# Patient Record
Sex: Female | Born: 1935 | Race: White | Hispanic: Yes | Marital: Married | Smoking: Former smoker
Health system: Southern US, Community
[De-identification: ages and names within clinical notes are randomized; demographics above are authoritative.]

## PROBLEM LIST (undated history)

## (undated) ENCOUNTER — Emergency Department (HOSPITAL_COMMUNITY): Admission: EM | Payer: Medicare Other

## (undated) DIAGNOSIS — I6521 Occlusion and stenosis of right carotid artery: Secondary | ICD-10-CM

## (undated) DIAGNOSIS — C9110 Chronic lymphocytic leukemia of B-cell type not having achieved remission: Secondary | ICD-10-CM

## (undated) DIAGNOSIS — I10 Essential (primary) hypertension: Secondary | ICD-10-CM

## (undated) DIAGNOSIS — R0989 Other specified symptoms and signs involving the circulatory and respiratory systems: Secondary | ICD-10-CM

## (undated) DIAGNOSIS — N182 Chronic kidney disease, stage 2 (mild): Secondary | ICD-10-CM

## (undated) DIAGNOSIS — Z86010 Personal history of colonic polyps: Secondary | ICD-10-CM

## (undated) DIAGNOSIS — E785 Hyperlipidemia, unspecified: Secondary | ICD-10-CM

## (undated) HISTORY — DX: Chronic kidney disease, stage 2 (mild): N18.2

## (undated) HISTORY — DX: Other specified symptoms and signs involving the circulatory and respiratory systems: R09.89

## (undated) HISTORY — DX: Personal history of colonic polyps: Z86.010

## (undated) HISTORY — DX: Occlusion and stenosis of right carotid artery: I65.21

## (undated) HISTORY — DX: Hyperlipidemia, unspecified: E78.5

## (undated) HISTORY — DX: Essential (primary) hypertension: I10

## (undated) HISTORY — DX: Chronic lymphocytic leukemia of B-cell type not having achieved remission: C91.10

---

## 1998-05-24 ENCOUNTER — Encounter: Admission: RE | Admit: 1998-05-24 | Discharge: 1998-05-24 | Payer: Self-pay | Admitting: Internal Medicine

## 1998-12-30 ENCOUNTER — Encounter: Admission: RE | Admit: 1998-12-30 | Discharge: 1998-12-30 | Payer: Self-pay | Admitting: Internal Medicine

## 1999-07-09 ENCOUNTER — Encounter: Admission: RE | Admit: 1999-07-09 | Discharge: 1999-07-09 | Payer: Self-pay | Admitting: Internal Medicine

## 1999-09-30 ENCOUNTER — Encounter: Admission: RE | Admit: 1999-09-30 | Discharge: 1999-09-30 | Payer: Self-pay | Admitting: Internal Medicine

## 2000-01-14 ENCOUNTER — Encounter: Payer: Self-pay | Admitting: Internal Medicine

## 2000-01-14 ENCOUNTER — Encounter: Admission: RE | Admit: 2000-01-14 | Discharge: 2000-01-14 | Payer: Self-pay | Admitting: Internal Medicine

## 2000-01-28 ENCOUNTER — Encounter: Admission: RE | Admit: 2000-01-28 | Discharge: 2000-01-28 | Payer: Self-pay | Admitting: Internal Medicine

## 2000-07-29 ENCOUNTER — Encounter: Payer: Self-pay | Admitting: General Surgery

## 2000-07-29 ENCOUNTER — Encounter: Admission: RE | Admit: 2000-07-29 | Discharge: 2000-07-29 | Payer: Self-pay | Admitting: General Surgery

## 2000-08-31 ENCOUNTER — Encounter: Admission: RE | Admit: 2000-08-31 | Discharge: 2000-08-31 | Payer: Self-pay | Admitting: Internal Medicine

## 2000-09-08 ENCOUNTER — Encounter: Admission: RE | Admit: 2000-09-08 | Discharge: 2000-09-08 | Payer: Self-pay | Admitting: Hematology and Oncology

## 2000-09-23 ENCOUNTER — Encounter: Admission: RE | Admit: 2000-09-23 | Discharge: 2000-09-23 | Payer: Self-pay | Admitting: Internal Medicine

## 2000-09-23 ENCOUNTER — Ambulatory Visit (HOSPITAL_COMMUNITY): Admission: RE | Admit: 2000-09-23 | Discharge: 2000-09-23 | Payer: Self-pay | Admitting: Hematology and Oncology

## 2000-09-29 ENCOUNTER — Encounter: Admission: RE | Admit: 2000-09-29 | Discharge: 2000-09-29 | Payer: Self-pay | Admitting: Internal Medicine

## 2000-11-23 DIAGNOSIS — Z860100 Personal history of colon polyps, unspecified: Secondary | ICD-10-CM

## 2000-11-23 HISTORY — DX: Personal history of colonic polyps: Z86.010

## 2000-11-23 HISTORY — DX: Personal history of colon polyps, unspecified: Z86.0100

## 2001-02-10 ENCOUNTER — Encounter: Payer: Self-pay | Admitting: Internal Medicine

## 2001-02-10 ENCOUNTER — Encounter: Admission: RE | Admit: 2001-02-10 | Discharge: 2001-02-10 | Payer: Self-pay | Admitting: Internal Medicine

## 2001-04-01 ENCOUNTER — Ambulatory Visit (HOSPITAL_COMMUNITY): Admission: RE | Admit: 2001-04-01 | Discharge: 2001-04-01 | Payer: Self-pay | Admitting: Gastroenterology

## 2001-04-01 ENCOUNTER — Encounter (INDEPENDENT_AMBULATORY_CARE_PROVIDER_SITE_OTHER): Payer: Self-pay | Admitting: *Deleted

## 2001-08-23 ENCOUNTER — Encounter: Admission: RE | Admit: 2001-08-23 | Discharge: 2001-08-23 | Payer: Self-pay | Admitting: Internal Medicine

## 2002-02-13 ENCOUNTER — Encounter: Admission: RE | Admit: 2002-02-13 | Discharge: 2002-02-13 | Payer: Self-pay | Admitting: Internal Medicine

## 2002-02-13 ENCOUNTER — Encounter: Payer: Self-pay | Admitting: Internal Medicine

## 2002-04-26 ENCOUNTER — Encounter: Admission: RE | Admit: 2002-04-26 | Discharge: 2002-04-26 | Payer: Self-pay | Admitting: Internal Medicine

## 2002-10-03 ENCOUNTER — Encounter: Admission: RE | Admit: 2002-10-03 | Discharge: 2002-10-03 | Payer: Self-pay | Admitting: Internal Medicine

## 2002-11-01 ENCOUNTER — Encounter: Admission: RE | Admit: 2002-11-01 | Discharge: 2002-11-01 | Payer: Self-pay | Admitting: Internal Medicine

## 2002-12-20 ENCOUNTER — Encounter: Admission: RE | Admit: 2002-12-20 | Discharge: 2002-12-20 | Payer: Self-pay | Admitting: Internal Medicine

## 2003-02-15 ENCOUNTER — Encounter: Payer: Self-pay | Admitting: Internal Medicine

## 2003-02-15 ENCOUNTER — Encounter: Admission: RE | Admit: 2003-02-15 | Discharge: 2003-02-15 | Payer: Self-pay | Admitting: Internal Medicine

## 2003-04-18 ENCOUNTER — Encounter: Admission: RE | Admit: 2003-04-18 | Discharge: 2003-04-18 | Payer: Self-pay | Admitting: Internal Medicine

## 2003-07-10 ENCOUNTER — Encounter: Admission: RE | Admit: 2003-07-10 | Discharge: 2003-07-10 | Payer: Self-pay | Admitting: Internal Medicine

## 2003-11-12 ENCOUNTER — Encounter: Admission: RE | Admit: 2003-11-12 | Discharge: 2003-11-12 | Payer: Self-pay | Admitting: Internal Medicine

## 2004-01-22 ENCOUNTER — Encounter: Admission: RE | Admit: 2004-01-22 | Discharge: 2004-01-22 | Payer: Self-pay | Admitting: Internal Medicine

## 2004-03-03 ENCOUNTER — Encounter: Admission: RE | Admit: 2004-03-03 | Discharge: 2004-03-03 | Payer: Self-pay | Admitting: Internal Medicine

## 2004-09-22 ENCOUNTER — Ambulatory Visit (HOSPITAL_COMMUNITY): Admission: RE | Admit: 2004-09-22 | Discharge: 2004-09-22 | Payer: Self-pay | Admitting: Gastroenterology

## 2004-10-06 ENCOUNTER — Ambulatory Visit: Payer: Self-pay | Admitting: Internal Medicine

## 2004-10-21 ENCOUNTER — Ambulatory Visit: Payer: Self-pay | Admitting: Oncology

## 2005-01-27 ENCOUNTER — Ambulatory Visit: Payer: Self-pay | Admitting: Oncology

## 2005-03-02 ENCOUNTER — Ambulatory Visit: Payer: Self-pay | Admitting: Internal Medicine

## 2005-04-06 ENCOUNTER — Encounter: Admission: RE | Admit: 2005-04-06 | Discharge: 2005-04-06 | Payer: Self-pay | Admitting: Internal Medicine

## 2005-07-06 ENCOUNTER — Ambulatory Visit: Payer: Self-pay | Admitting: Internal Medicine

## 2005-07-23 ENCOUNTER — Ambulatory Visit: Payer: Self-pay | Admitting: Internal Medicine

## 2005-07-24 ENCOUNTER — Ambulatory Visit: Payer: Self-pay | Admitting: Oncology

## 2005-08-23 DIAGNOSIS — I6521 Occlusion and stenosis of right carotid artery: Secondary | ICD-10-CM

## 2005-08-23 HISTORY — DX: Occlusion and stenosis of right carotid artery: I65.21

## 2005-09-03 ENCOUNTER — Emergency Department (HOSPITAL_COMMUNITY): Admission: EM | Admit: 2005-09-03 | Discharge: 2005-09-03 | Payer: Self-pay | Admitting: Emergency Medicine

## 2005-09-16 ENCOUNTER — Encounter: Payer: Self-pay | Admitting: Cardiology

## 2005-09-16 ENCOUNTER — Ambulatory Visit (HOSPITAL_COMMUNITY): Admission: RE | Admit: 2005-09-16 | Discharge: 2005-09-16 | Payer: Self-pay | Admitting: Internal Medicine

## 2005-09-16 ENCOUNTER — Ambulatory Visit: Payer: Self-pay | Admitting: Cardiology

## 2005-09-21 ENCOUNTER — Ambulatory Visit: Payer: Self-pay | Admitting: Internal Medicine

## 2005-09-23 ENCOUNTER — Ambulatory Visit (HOSPITAL_COMMUNITY): Admission: RE | Admit: 2005-09-23 | Discharge: 2005-09-23 | Payer: Self-pay | Admitting: Internal Medicine

## 2005-10-28 ENCOUNTER — Ambulatory Visit: Payer: Self-pay | Admitting: Internal Medicine

## 2005-11-03 ENCOUNTER — Ambulatory Visit: Payer: Self-pay | Admitting: Oncology

## 2006-02-01 ENCOUNTER — Ambulatory Visit: Payer: Self-pay | Admitting: Oncology

## 2006-03-09 LAB — CBC WITH DIFFERENTIAL/PLATELET
Basophils Absolute: 0.5 10*3/uL — ABNORMAL HIGH (ref 0.0–0.1)
Eosinophils Absolute: 0.8 10*3/uL — ABNORMAL HIGH (ref 0.0–0.5)
HCT: 33.4 % — ABNORMAL LOW (ref 34.8–46.6)
HGB: 10.8 g/dL — ABNORMAL LOW (ref 11.6–15.9)
LYMPH%: 71.8 % — ABNORMAL HIGH (ref 14.0–48.0)
MCV: 96.7 fL (ref 81.0–101.0)
MONO#: 16.9 10*3/uL — ABNORMAL HIGH (ref 0.1–0.9)
NEUT#: 12.5 10*3/uL — ABNORMAL HIGH (ref 1.5–6.5)
NEUT%: 11.5 % — ABNORMAL LOW (ref 39.6–76.8)
Platelets: 365 10*3/uL (ref 145–400)
RBC: 3.45 10*6/uL — ABNORMAL LOW (ref 3.70–5.32)
WBC: 108.5 10*3/uL (ref 3.9–10.0)

## 2006-03-09 LAB — TECHNOLOGIST REVIEW

## 2006-03-16 LAB — CBC WITH DIFFERENTIAL/PLATELET
Basophils Absolute: 0.1 10*3/uL (ref 0.0–0.1)
EOS%: 2.4 % (ref 0.0–7.0)
HCT: 30.9 % — ABNORMAL LOW (ref 34.8–46.6)
HGB: 10.7 g/dL — ABNORMAL LOW (ref 11.6–15.9)
LYMPH%: 26.7 % (ref 14.0–48.0)
MCH: 31.9 pg (ref 26.0–34.0)
NEUT%: 56.7 % (ref 39.6–76.8)
Platelets: 307 10*3/uL (ref 145–400)
lymph#: 2.1 10*3/uL (ref 0.9–3.3)

## 2006-03-21 ENCOUNTER — Ambulatory Visit: Payer: Self-pay | Admitting: Oncology

## 2006-03-23 LAB — CBC WITH DIFFERENTIAL/PLATELET
Basophils Absolute: 0.1 10*3/uL (ref 0.0–0.1)
Eosinophils Absolute: 0.2 10*3/uL (ref 0.0–0.5)
HGB: 11 g/dL — ABNORMAL LOW (ref 11.6–15.9)
LYMPH%: 34.8 % (ref 14.0–48.0)
MCV: 92 fL (ref 81.0–101.0)
MONO#: 1.2 10*3/uL — ABNORMAL HIGH (ref 0.1–0.9)
MONO%: 13 % (ref 0.0–13.0)
NEUT#: 4.5 10*3/uL (ref 1.5–6.5)
Platelets: 348 10*3/uL (ref 145–400)
WBC: 9.1 10*3/uL (ref 3.9–10.0)

## 2006-03-29 ENCOUNTER — Ambulatory Visit: Payer: Self-pay | Admitting: Internal Medicine

## 2006-03-30 LAB — CBC WITH DIFFERENTIAL/PLATELET
Eosinophils Absolute: 0.2 10*3/uL (ref 0.0–0.5)
HCT: 31 % — ABNORMAL LOW (ref 34.8–46.6)
LYMPH%: 37 % (ref 14.0–48.0)
MCHC: 34.2 g/dL (ref 32.0–36.0)
MCV: 92.4 fL (ref 81.0–101.0)
MONO#: 1 10*3/uL — ABNORMAL HIGH (ref 0.1–0.9)
MONO%: 12.8 % (ref 0.0–13.0)
NEUT#: 3.6 10*3/uL (ref 1.5–6.5)
NEUT%: 46.6 % (ref 39.6–76.8)
Platelets: 333 10*3/uL (ref 145–400)
RBC: 3.35 10*6/uL — ABNORMAL LOW (ref 3.70–5.32)
WBC: 7.6 10*3/uL (ref 3.9–10.0)

## 2006-04-06 LAB — CBC WITH DIFFERENTIAL/PLATELET
BASO%: 1.2 % (ref 0.0–2.0)
EOS%: 5.6 % (ref 0.0–7.0)
HCT: 32.1 % — ABNORMAL LOW (ref 34.8–46.6)
LYMPH%: 20.9 % (ref 14.0–48.0)
MCH: 31.7 pg (ref 26.0–34.0)
MCHC: 34.1 g/dL (ref 32.0–36.0)
MONO%: 12.8 % (ref 0.0–13.0)
NEUT%: 59.5 % (ref 39.6–76.8)
Platelets: 305 10*3/uL (ref 145–400)
RBC: 3.45 10*6/uL — ABNORMAL LOW (ref 3.70–5.32)
WBC: 8 10*3/uL (ref 3.9–10.0)

## 2006-04-13 LAB — CBC WITH DIFFERENTIAL/PLATELET
Basophils Absolute: 0.1 10*3/uL (ref 0.0–0.1)
Eosinophils Absolute: 0.5 10*3/uL (ref 0.0–0.5)
HGB: 11 g/dL — ABNORMAL LOW (ref 11.6–15.9)
MONO#: 1.1 10*3/uL — ABNORMAL HIGH (ref 0.1–0.9)
NEUT#: 4.6 10*3/uL (ref 1.5–6.5)
RBC: 3.51 10*6/uL — ABNORMAL LOW (ref 3.70–5.32)
RDW: 12.2 % (ref 11.3–14.5)
WBC: 8 10*3/uL (ref 3.9–10.0)
lymph#: 1.7 10*3/uL (ref 0.9–3.3)

## 2006-04-15 ENCOUNTER — Encounter: Admission: RE | Admit: 2006-04-15 | Discharge: 2006-04-15 | Payer: Self-pay | Admitting: Internal Medicine

## 2006-04-23 LAB — CBC WITH DIFFERENTIAL/PLATELET
Basophils Absolute: 0 10*3/uL (ref 0.0–0.1)
EOS%: 6.3 % (ref 0.0–7.0)
HCT: 32.4 % — ABNORMAL LOW (ref 34.8–46.6)
HGB: 10.9 g/dL — ABNORMAL LOW (ref 11.6–15.9)
MCH: 31.2 pg (ref 26.0–34.0)
MCV: 92.8 fL (ref 81.0–101.0)
MONO%: 14.2 % — ABNORMAL HIGH (ref 0.0–13.0)
NEUT%: 52.5 % (ref 39.6–76.8)

## 2006-04-27 LAB — IMMUNOFIXATION ELECTROPHORESIS
IgM, Serum: 42 mg/dL — ABNORMAL LOW (ref 60–263)
Total Protein, Serum Electrophoresis: 6.5 g/dL (ref 6.0–8.3)

## 2006-04-27 LAB — COMPREHENSIVE METABOLIC PANEL
ALT: 14 U/L (ref 0–40)
AST: 17 U/L (ref 0–37)
Albumin: 4.3 g/dL (ref 3.5–5.2)
CO2: 25 mEq/L (ref 19–32)
Calcium: 9.9 mg/dL (ref 8.4–10.5)
Chloride: 106 mEq/L (ref 96–112)
Creatinine, Ser: 1.3 mg/dL — ABNORMAL HIGH (ref 0.40–1.20)
Potassium: 4.2 mEq/L (ref 3.5–5.3)

## 2006-04-30 ENCOUNTER — Encounter: Admission: RE | Admit: 2006-04-30 | Discharge: 2006-04-30 | Payer: Self-pay | Admitting: Internal Medicine

## 2006-05-18 ENCOUNTER — Ambulatory Visit: Payer: Self-pay | Admitting: Oncology

## 2006-05-21 LAB — MORPHOLOGY: PLT EST: ADEQUATE

## 2006-05-21 LAB — CBC WITH DIFFERENTIAL/PLATELET
BASO%: 0.1 % (ref 0.0–2.0)
Basophils Absolute: 0 10*3/uL (ref 0.0–0.1)
EOS%: 3 % (ref 0.0–7.0)
Eosinophils Absolute: 0.2 10*3/uL (ref 0.0–0.5)
HCT: 32.5 % — ABNORMAL LOW (ref 34.8–46.6)
HGB: 11.1 g/dL — ABNORMAL LOW (ref 11.6–15.9)
MCV: 91.4 fL (ref 81.0–101.0)
MONO#: 1 10*3/uL — ABNORMAL HIGH (ref 0.1–0.9)
NEUT#: 4.2 10*3/uL (ref 1.5–6.5)
NEUT%: 59.3 % (ref 39.6–76.8)
Platelets: 343 10*3/uL (ref 145–400)
WBC: 7.1 10*3/uL (ref 3.9–10.0)

## 2006-07-11 ENCOUNTER — Ambulatory Visit: Payer: Self-pay | Admitting: Oncology

## 2006-07-20 LAB — CBC WITH DIFFERENTIAL/PLATELET
BASO%: 0.5 % (ref 0.0–2.0)
Eosinophils Absolute: 0.2 10*3/uL (ref 0.0–0.5)
HCT: 33.1 % — ABNORMAL LOW (ref 34.8–46.6)
LYMPH%: 22.7 % (ref 14.0–48.0)
MCHC: 34.7 g/dL (ref 32.0–36.0)
MCV: 91 fL (ref 81.0–101.0)
MONO#: 0.9 10*3/uL (ref 0.1–0.9)
MONO%: 12.1 % (ref 0.0–13.0)
NEUT%: 62.5 % (ref 39.6–76.8)
Platelets: 357 10*3/uL (ref 145–400)
WBC: 7.4 10*3/uL (ref 3.9–10.0)

## 2006-07-21 ENCOUNTER — Ambulatory Visit: Payer: Self-pay | Admitting: Internal Medicine

## 2006-09-23 ENCOUNTER — Ambulatory Visit: Payer: Self-pay | Admitting: Oncology

## 2006-09-27 LAB — CBC WITH DIFFERENTIAL/PLATELET
BASO%: 0.4 % (ref 0.0–2.0)
Basophils Absolute: 0 10*3/uL (ref 0.0–0.1)
EOS%: 3.1 % (ref 0.0–7.0)
HGB: 11.6 g/dL (ref 11.6–15.9)
MCH: 32 pg (ref 26.0–34.0)
RBC: 3.61 10*6/uL — ABNORMAL LOW (ref 3.70–5.32)
RDW: 12.9 % (ref 11.3–14.5)
lymph#: 2.2 10*3/uL (ref 0.9–3.3)

## 2006-09-28 DIAGNOSIS — E1122 Type 2 diabetes mellitus with diabetic chronic kidney disease: Secondary | ICD-10-CM

## 2006-09-28 DIAGNOSIS — I6521 Occlusion and stenosis of right carotid artery: Secondary | ICD-10-CM

## 2006-09-28 DIAGNOSIS — I10 Essential (primary) hypertension: Secondary | ICD-10-CM | POA: Insufficient documentation

## 2006-09-28 DIAGNOSIS — C9110 Chronic lymphocytic leukemia of B-cell type not having achieved remission: Secondary | ICD-10-CM

## 2006-09-28 DIAGNOSIS — N183 Chronic kidney disease, stage 3 (moderate): Secondary | ICD-10-CM

## 2006-09-28 DIAGNOSIS — Z860100 Personal history of colon polyps, unspecified: Secondary | ICD-10-CM | POA: Insufficient documentation

## 2006-09-28 DIAGNOSIS — E785 Hyperlipidemia, unspecified: Secondary | ICD-10-CM | POA: Insufficient documentation

## 2006-09-28 LAB — COMPREHENSIVE METABOLIC PANEL
ALT: 13 U/L (ref 0–35)
AST: 14 U/L (ref 0–37)
Albumin: 4.5 g/dL (ref 3.5–5.2)
BUN: 48 mg/dL — ABNORMAL HIGH (ref 6–23)
Calcium: 9.5 mg/dL (ref 8.4–10.5)
Chloride: 109 mEq/L (ref 96–112)
Potassium: 4.3 mEq/L (ref 3.5–5.3)
Sodium: 143 mEq/L (ref 135–145)
Total Protein: 7 g/dL (ref 6.0–8.3)

## 2006-10-05 LAB — CBC WITH DIFFERENTIAL/PLATELET
BASO%: 0.8 % (ref 0.0–2.0)
EOS%: 6.8 % (ref 0.0–7.0)
HGB: 11.5 g/dL — ABNORMAL LOW (ref 11.6–15.9)
MCH: 31.5 pg (ref 26.0–34.0)
MCV: 90 fL (ref 81.0–101.0)
MONO%: 16.8 % — ABNORMAL HIGH (ref 0.0–13.0)
NEUT#: 4.1 10*3/uL (ref 1.5–6.5)
RBC: 3.65 10*6/uL — ABNORMAL LOW (ref 3.70–5.32)
RDW: 10.9 % — ABNORMAL LOW (ref 11.3–14.5)
lymph#: 1.7 10*3/uL (ref 0.9–3.3)

## 2006-10-11 ENCOUNTER — Ambulatory Visit: Payer: Self-pay | Admitting: Internal Medicine

## 2006-10-11 LAB — CONVERTED CEMR LAB
ALT: 11 units/L (ref 0–35)
Alkaline Phosphatase: 70 units/L (ref 39–117)
BUN: 39 mg/dL — ABNORMAL HIGH (ref 6–23)
CO2: 23 meq/L (ref 19–32)
Calcium: 9.2 mg/dL (ref 8.4–10.5)
Chloride: 104 meq/L (ref 96–112)
Potassium: 3.9 meq/L (ref 3.5–5.3)
Sodium: 143 meq/L (ref 135–145)
Total Bilirubin: 0.6 mg/dL (ref 0.3–1.2)
Total Protein: 7 g/dL (ref 6.0–8.3)

## 2006-10-12 LAB — CBC WITH DIFFERENTIAL/PLATELET
Basophils Absolute: 0.1 10*3/uL (ref 0.0–0.1)
Eosinophils Absolute: 1.3 10*3/uL — ABNORMAL HIGH (ref 0.0–0.5)
HGB: 11.1 g/dL — ABNORMAL LOW (ref 11.6–15.9)
MONO#: 1.2 10*3/uL — ABNORMAL HIGH (ref 0.1–0.9)
MONO%: 11 % (ref 0.0–13.0)
NEUT#: 6.3 10*3/uL (ref 1.5–6.5)
RBC: 3.55 10*6/uL — ABNORMAL LOW (ref 3.70–5.32)
RDW: 11 % — ABNORMAL LOW (ref 11.3–14.5)
WBC: 10.9 10*3/uL — ABNORMAL HIGH (ref 3.9–10.0)
lymph#: 1.9 10*3/uL (ref 0.9–3.3)

## 2006-10-19 LAB — CBC WITH DIFFERENTIAL/PLATELET
Basophils Absolute: 0.1 10*3/uL (ref 0.0–0.1)
Eosinophils Absolute: 0.8 10*3/uL — ABNORMAL HIGH (ref 0.0–0.5)
HCT: 31.8 % — ABNORMAL LOW (ref 34.8–46.6)
HGB: 11 g/dL — ABNORMAL LOW (ref 11.6–15.9)
LYMPH%: 15.7 % (ref 14.0–48.0)
MCV: 90.2 fL (ref 81.0–101.0)
MONO#: 1.6 10*3/uL — ABNORMAL HIGH (ref 0.1–0.9)
NEUT#: 7.5 10*3/uL — ABNORMAL HIGH (ref 1.5–6.5)
Platelets: 428 10*3/uL — ABNORMAL HIGH (ref 145–400)
RBC: 3.53 10*6/uL — ABNORMAL LOW (ref 3.70–5.32)
WBC: 11.8 10*3/uL — ABNORMAL HIGH (ref 3.9–10.0)

## 2006-11-10 ENCOUNTER — Ambulatory Visit: Payer: Self-pay | Admitting: Internal Medicine

## 2007-01-13 ENCOUNTER — Ambulatory Visit: Payer: Self-pay | Admitting: Oncology

## 2007-01-18 LAB — CBC WITH DIFFERENTIAL/PLATELET
Basophils Absolute: 0 10*3/uL (ref 0.0–0.1)
EOS%: 1.3 % (ref 0.0–7.0)
HCT: 34.1 % — ABNORMAL LOW (ref 34.8–46.6)
HGB: 11.8 g/dL (ref 11.6–15.9)
MCH: 31.8 pg (ref 26.0–34.0)
MCHC: 34.5 g/dL (ref 32.0–36.0)
MCV: 91.9 fL (ref 81.0–101.0)
MONO%: 10 % (ref 0.0–13.0)
NEUT%: 71.5 % (ref 39.6–76.8)
RDW: 13.5 % (ref 11.3–14.5)

## 2007-01-19 LAB — COMPREHENSIVE METABOLIC PANEL
ALT: 17 U/L (ref 0–35)
AST: 16 U/L (ref 0–37)
Alkaline Phosphatase: 73 U/L (ref 39–117)
BUN: 47 mg/dL — ABNORMAL HIGH (ref 6–23)
Calcium: 10.1 mg/dL (ref 8.4–10.5)
Chloride: 106 mEq/L (ref 96–112)
Creatinine, Ser: 1.18 mg/dL (ref 0.40–1.20)
Total Bilirubin: 0.4 mg/dL (ref 0.3–1.2)

## 2007-01-25 ENCOUNTER — Encounter: Payer: Self-pay | Admitting: Internal Medicine

## 2007-03-08 ENCOUNTER — Ambulatory Visit: Payer: Self-pay | Admitting: Internal Medicine

## 2007-03-08 DIAGNOSIS — I499 Cardiac arrhythmia, unspecified: Secondary | ICD-10-CM | POA: Insufficient documentation

## 2007-04-14 ENCOUNTER — Ambulatory Visit: Payer: Self-pay | Admitting: Oncology

## 2007-04-19 LAB — COMPREHENSIVE METABOLIC PANEL
AST: 15 U/L (ref 0–37)
Albumin: 4.4 g/dL (ref 3.5–5.2)
Alkaline Phosphatase: 66 U/L (ref 39–117)
BUN: 46 mg/dL — ABNORMAL HIGH (ref 6–23)
Creatinine, Ser: 1.18 mg/dL (ref 0.40–1.20)
Glucose, Bld: 113 mg/dL — ABNORMAL HIGH (ref 70–99)
Potassium: 4.2 mEq/L (ref 3.5–5.3)

## 2007-04-19 LAB — CBC WITH DIFFERENTIAL/PLATELET
Basophils Absolute: 0 10*3/uL (ref 0.0–0.1)
EOS%: 2.4 % (ref 0.0–7.0)
Eosinophils Absolute: 0.2 10*3/uL (ref 0.0–0.5)
HCT: 32.3 % — ABNORMAL LOW (ref 34.8–46.6)
HGB: 11.2 g/dL — ABNORMAL LOW (ref 11.6–15.9)
LYMPH%: 23 % (ref 14.0–48.0)
MCH: 32.3 pg (ref 26.0–34.0)
MCV: 93.1 fL (ref 81.0–101.0)
MONO%: 12.3 % (ref 0.0–13.0)
NEUT#: 4.6 10*3/uL (ref 1.5–6.5)
NEUT%: 62 % (ref 39.6–76.8)
Platelets: 336 10*3/uL (ref 145–400)
RDW: 12.8 % (ref 11.3–14.5)

## 2007-04-26 ENCOUNTER — Encounter: Payer: Self-pay | Admitting: Internal Medicine

## 2007-06-15 ENCOUNTER — Encounter: Payer: Self-pay | Admitting: Internal Medicine

## 2007-06-15 ENCOUNTER — Encounter: Admission: RE | Admit: 2007-06-15 | Discharge: 2007-06-15 | Payer: Self-pay | Admitting: Internal Medicine

## 2007-07-17 ENCOUNTER — Ambulatory Visit: Payer: Self-pay | Admitting: Oncology

## 2007-07-19 LAB — CBC WITH DIFFERENTIAL/PLATELET
Basophils Absolute: 0.1 10*3/uL (ref 0.0–0.1)
Eosinophils Absolute: 0.2 10*3/uL (ref 0.0–0.5)
HGB: 11.9 g/dL (ref 11.6–15.9)
MONO#: 0.9 10*3/uL (ref 0.1–0.9)
NEUT#: 7.4 10*3/uL — ABNORMAL HIGH (ref 1.5–6.5)
Platelets: 350 10*3/uL (ref 145–400)
RBC: 3.64 10*6/uL — ABNORMAL LOW (ref 3.70–5.32)
RDW: 12.7 % (ref 11.3–14.5)
WBC: 10.9 10*3/uL — ABNORMAL HIGH (ref 3.9–10.0)

## 2007-07-20 ENCOUNTER — Ambulatory Visit: Payer: Self-pay | Admitting: Internal Medicine

## 2007-07-20 LAB — CONVERTED CEMR LAB
Blood Glucose, Fingerstick: 129
Cholesterol: 125 mg/dL (ref 0–200)
Microalb Creat Ratio: 5.2 mg/g (ref 0.0–30.0)

## 2007-07-21 LAB — COMPREHENSIVE METABOLIC PANEL
Albumin: 4.3 g/dL (ref 3.5–5.2)
BUN: 47 mg/dL — ABNORMAL HIGH (ref 6–23)
CO2: 24 mEq/L (ref 19–32)
Calcium: 9.7 mg/dL (ref 8.4–10.5)
Chloride: 104 mEq/L (ref 96–112)
Glucose, Bld: 181 mg/dL — ABNORMAL HIGH (ref 70–99)
Potassium: 4 mEq/L (ref 3.5–5.3)

## 2007-07-21 LAB — IMMUNOFIXATION ELECTROPHORESIS
IgG (Immunoglobin G), Serum: 854 mg/dL (ref 694–1618)
Total Protein, Serum Electrophoresis: 6.8 g/dL (ref 6.0–8.3)

## 2007-07-26 ENCOUNTER — Encounter: Payer: Self-pay | Admitting: Internal Medicine

## 2007-09-19 ENCOUNTER — Ambulatory Visit: Payer: Self-pay | Admitting: Infectious Diseases

## 2007-12-09 ENCOUNTER — Telehealth: Payer: Self-pay | Admitting: Internal Medicine

## 2007-12-09 ENCOUNTER — Ambulatory Visit: Payer: Self-pay | Admitting: Surgery

## 2007-12-09 ENCOUNTER — Ambulatory Visit (HOSPITAL_COMMUNITY): Admission: RE | Admit: 2007-12-09 | Discharge: 2007-12-09 | Payer: Self-pay | Admitting: Internal Medicine

## 2007-12-09 ENCOUNTER — Encounter: Payer: Self-pay | Admitting: Internal Medicine

## 2007-12-09 ENCOUNTER — Ambulatory Visit: Payer: Self-pay | Admitting: Internal Medicine

## 2007-12-14 ENCOUNTER — Encounter: Payer: Self-pay | Admitting: *Deleted

## 2007-12-14 ENCOUNTER — Encounter: Payer: Self-pay | Admitting: Internal Medicine

## 2007-12-14 ENCOUNTER — Ambulatory Visit: Payer: Self-pay | Admitting: Internal Medicine

## 2007-12-14 LAB — CONVERTED CEMR LAB
CO2: 23 meq/L (ref 19–32)
Calcium: 10.4 mg/dL (ref 8.4–10.5)
Chloride: 104 meq/L (ref 96–112)
Potassium: 4.2 meq/L (ref 3.5–5.3)
Sodium: 142 meq/L (ref 135–145)

## 2007-12-15 ENCOUNTER — Ambulatory Visit (HOSPITAL_COMMUNITY): Admission: RE | Admit: 2007-12-15 | Discharge: 2007-12-15 | Payer: Self-pay | Admitting: Internal Medicine

## 2007-12-16 ENCOUNTER — Encounter: Payer: Self-pay | Admitting: Internal Medicine

## 2008-01-12 ENCOUNTER — Ambulatory Visit: Payer: Self-pay | Admitting: Oncology

## 2008-01-17 LAB — CBC WITH DIFFERENTIAL/PLATELET
BASO%: 0.5 % (ref 0.0–2.0)
EOS%: 1.7 % (ref 0.0–7.0)
HCT: 33.6 % — ABNORMAL LOW (ref 34.8–46.6)
LYMPH%: 35.1 % (ref 14.0–48.0)
MCH: 31.1 pg (ref 26.0–34.0)
MCHC: 33.5 g/dL (ref 32.0–36.0)
MONO%: 9.6 % (ref 0.0–13.0)
NEUT%: 53.1 % (ref 39.6–76.8)
Platelets: 391 10*3/uL (ref 145–400)
RBC: 3.61 10*6/uL — ABNORMAL LOW (ref 3.70–5.32)
WBC: 10.4 10*3/uL — ABNORMAL HIGH (ref 3.9–10.0)

## 2008-01-17 LAB — MORPHOLOGY: RBC Comments: NORMAL

## 2008-01-18 LAB — COMPREHENSIVE METABOLIC PANEL
AST: 18 U/L (ref 0–37)
Albumin: 4.5 g/dL (ref 3.5–5.2)
Alkaline Phosphatase: 65 U/L (ref 39–117)
Glucose, Bld: 146 mg/dL — ABNORMAL HIGH (ref 70–99)
Potassium: 4 mEq/L (ref 3.5–5.3)
Sodium: 141 mEq/L (ref 135–145)
Total Bilirubin: 0.4 mg/dL (ref 0.3–1.2)
Total Protein: 7.1 g/dL (ref 6.0–8.3)

## 2008-01-18 LAB — BETA 2 MICROGLOBULIN, SERUM: Beta-2 Microglobulin: 3.89 mg/L — ABNORMAL HIGH (ref 1.01–1.73)

## 2008-03-21 ENCOUNTER — Ambulatory Visit: Payer: Self-pay | Admitting: Oncology

## 2008-03-26 ENCOUNTER — Encounter: Payer: Self-pay | Admitting: Internal Medicine

## 2008-03-30 ENCOUNTER — Ambulatory Visit: Payer: Self-pay | Admitting: Internal Medicine

## 2008-03-30 LAB — CONVERTED CEMR LAB: Hgb A1c MFr Bld: 6.4 %

## 2008-07-09 ENCOUNTER — Encounter: Admission: RE | Admit: 2008-07-09 | Discharge: 2008-07-09 | Payer: Self-pay | Admitting: Internal Medicine

## 2008-08-03 ENCOUNTER — Ambulatory Visit: Payer: Self-pay | Admitting: Internal Medicine

## 2008-08-03 LAB — CONVERTED CEMR LAB
Blood Glucose, AC Bkfst: 117 mg/dL
Hgb A1c MFr Bld: 6 %

## 2008-08-07 ENCOUNTER — Telehealth: Payer: Self-pay | Admitting: Internal Medicine

## 2008-08-07 ENCOUNTER — Ambulatory Visit: Payer: Self-pay | Admitting: Internal Medicine

## 2008-08-07 DIAGNOSIS — N183 Chronic kidney disease, stage 3 (moderate): Secondary | ICD-10-CM

## 2008-08-07 LAB — CONVERTED CEMR LAB
BUN: 58 mg/dL — ABNORMAL HIGH (ref 6–23)
CO2: 25 meq/L (ref 19–32)
Chloride: 101 meq/L (ref 96–112)
Cholesterol: 121 mg/dL (ref 0–200)
Creatinine, Ser: 1.89 mg/dL — ABNORMAL HIGH (ref 0.40–1.20)
HDL: 42 mg/dL (ref >39)
LDL Cholesterol: 61 mg/dL (ref 0–99)
Potassium: 4.1 meq/L (ref 3.5–5.3)
Triglycerides: 91 mg/dL (ref <150)
VLDL: 18 mg/dL (ref 0–40)

## 2008-08-15 ENCOUNTER — Encounter: Payer: Self-pay | Admitting: Internal Medicine

## 2008-08-30 ENCOUNTER — Telehealth: Payer: Self-pay | Admitting: *Deleted

## 2008-09-21 ENCOUNTER — Ambulatory Visit: Payer: Self-pay | Admitting: Oncology

## 2008-09-25 LAB — CBC & DIFF AND RETIC
BASO%: 0.1 % (ref 0.0–2.0)
Eosinophils Absolute: 0.3 10*3/uL (ref 0.0–0.5)
HCT: 32.9 % — ABNORMAL LOW (ref 34.8–46.6)
HGB: 11.3 g/dL — ABNORMAL LOW (ref 11.6–15.9)
IRF: 0.36 — ABNORMAL HIGH (ref 0.130–0.330)
LYMPH%: 42.5 % (ref 14.0–48.0)
MONO#: 1.3 10*3/uL — ABNORMAL HIGH (ref 0.1–0.9)
NEUT#: 6.3 10*3/uL (ref 1.5–6.5)
NEUT%: 45.6 % (ref 39.6–76.8)
Platelets: 319 10*3/uL (ref 145–400)
WBC: 13.7 10*3/uL — ABNORMAL HIGH (ref 3.9–10.0)
lymph#: 5.8 10*3/uL — ABNORMAL HIGH (ref 0.9–3.3)

## 2008-09-25 LAB — COMPREHENSIVE METABOLIC PANEL
ALT: 17 U/L (ref 0–35)
Albumin: 4.6 g/dL (ref 3.5–5.2)
Alkaline Phosphatase: 80 U/L (ref 39–117)
CO2: 24 mEq/L (ref 19–32)
Glucose, Bld: 149 mg/dL — ABNORMAL HIGH (ref 70–99)
Potassium: 4.5 mEq/L (ref 3.5–5.3)
Sodium: 142 mEq/L (ref 135–145)
Total Bilirubin: 0.5 mg/dL (ref 0.3–1.2)
Total Protein: 7.2 g/dL (ref 6.0–8.3)

## 2008-09-25 LAB — LACTATE DEHYDROGENASE: LDH: 220 U/L (ref 94–250)

## 2009-01-15 ENCOUNTER — Ambulatory Visit: Payer: Self-pay | Admitting: Internal Medicine

## 2009-01-15 LAB — CONVERTED CEMR LAB: Hgb A1c MFr Bld: 6.4 %

## 2009-01-21 LAB — CONVERTED CEMR LAB
Calcium: 10 mg/dL (ref 8.4–10.5)
Creatinine, Ser: 1.96 mg/dL — ABNORMAL HIGH (ref 0.40–1.20)
Potassium: 4 meq/L (ref 3.5–5.3)

## 2009-03-04 ENCOUNTER — Encounter: Payer: Self-pay | Admitting: Internal Medicine

## 2009-03-15 ENCOUNTER — Ambulatory Visit: Payer: Self-pay | Admitting: Oncology

## 2009-03-19 LAB — COMPREHENSIVE METABOLIC PANEL
ALT: 14 U/L (ref 0–35)
AST: 17 U/L (ref 0–37)
Alkaline Phosphatase: 72 U/L (ref 39–117)
Sodium: 141 mEq/L (ref 135–145)
Total Bilirubin: 0.5 mg/dL (ref 0.3–1.2)
Total Protein: 7.1 g/dL (ref 6.0–8.3)

## 2009-03-19 LAB — CBC & DIFF AND RETIC
BASO%: 0.3 % (ref 0.0–2.0)
HCT: 33.2 % — ABNORMAL LOW (ref 34.8–46.6)
IRF: 0.37 — ABNORMAL HIGH (ref 0.130–0.330)
MCHC: 34.6 g/dL (ref 31.5–36.0)
MONO#: 0.9 10*3/uL (ref 0.1–0.9)
NEUT%: 55.5 % (ref 38.4–76.8)
RBC: 3.63 10*6/uL — ABNORMAL LOW (ref 3.70–5.45)
Retic %: 1.2 % (ref 0.4–2.3)
WBC: 10.2 10*3/uL (ref 3.9–10.3)
lymph#: 3.3 10*3/uL (ref 0.9–3.3)

## 2009-03-26 ENCOUNTER — Encounter: Payer: Self-pay | Admitting: Internal Medicine

## 2009-03-28 ENCOUNTER — Ambulatory Visit (HOSPITAL_COMMUNITY): Admission: RE | Admit: 2009-03-28 | Discharge: 2009-03-28 | Payer: Self-pay | Admitting: Internal Medicine

## 2009-03-28 ENCOUNTER — Telehealth: Payer: Self-pay | Admitting: Internal Medicine

## 2009-03-29 ENCOUNTER — Ambulatory Visit: Payer: Self-pay | Admitting: Internal Medicine

## 2009-05-03 ENCOUNTER — Ambulatory Visit: Payer: Self-pay | Admitting: Internal Medicine

## 2009-05-03 LAB — CONVERTED CEMR LAB
Blood Glucose, AC Bkfst: 104 mg/dL
Hgb A1c MFr Bld: 6.3 %

## 2009-05-07 LAB — CONVERTED CEMR LAB
BUN: 51 mg/dL — ABNORMAL HIGH (ref 6–23)
CO2: 25 meq/L (ref 19–32)
Chloride: 103 meq/L (ref 96–112)
Potassium: 4.1 meq/L (ref 3.5–5.3)
Sodium: 143 meq/L (ref 135–145)

## 2009-07-18 ENCOUNTER — Encounter: Admission: RE | Admit: 2009-07-18 | Discharge: 2009-07-18 | Payer: Self-pay | Admitting: Internal Medicine

## 2009-09-17 ENCOUNTER — Ambulatory Visit: Payer: Self-pay | Admitting: Internal Medicine

## 2009-09-17 DIAGNOSIS — IMO0002 Reserved for concepts with insufficient information to code with codable children: Secondary | ICD-10-CM

## 2009-09-17 DIAGNOSIS — M1710 Unilateral primary osteoarthritis, unspecified knee: Secondary | ICD-10-CM | POA: Insufficient documentation

## 2009-09-17 LAB — CONVERTED CEMR LAB
Cholesterol: 131 mg/dL (ref 0–200)
HDL: 38 mg/dL — ABNORMAL LOW (ref >39)
LDL Cholesterol: 67 mg/dL (ref 0–99)
TSH: 1.035 microintl units/mL (ref 0.350–4.5)
Total CHOL/HDL Ratio: 3.4
Triglycerides: 132 mg/dL (ref <150)
VLDL: 26 mg/dL (ref 0–40)

## 2010-01-14 ENCOUNTER — Ambulatory Visit: Payer: Self-pay | Admitting: Internal Medicine

## 2010-01-14 LAB — CONVERTED CEMR LAB
BUN: 48 mg/dL — ABNORMAL HIGH (ref 6–23)
Blood Glucose, Fingerstick: 100
Chloride: 103 meq/L (ref 96–112)
Creatinine, Urine: 27 mg/dL
Hgb A1c MFr Bld: 6.4 %
Microalb Creat Ratio: 27.8 mg/g (ref 0.0–30.0)
Microalb, Ur: 0.75 mg/dL (ref 0.00–1.89)

## 2010-02-26 ENCOUNTER — Encounter: Payer: Self-pay | Admitting: Internal Medicine

## 2010-02-27 ENCOUNTER — Encounter: Payer: Self-pay | Admitting: Internal Medicine

## 2010-03-04 LAB — HM DIABETES EYE EXAM: HM Diabetic Eye Exam: NORMAL

## 2010-03-24 ENCOUNTER — Encounter: Payer: Self-pay | Admitting: Internal Medicine

## 2010-03-26 ENCOUNTER — Ambulatory Visit: Payer: Self-pay | Admitting: Oncology

## 2010-03-27 ENCOUNTER — Encounter: Payer: Self-pay | Admitting: Internal Medicine

## 2010-03-27 LAB — CBC WITH DIFFERENTIAL/PLATELET
BASO%: 0.4 % (ref 0.0–2.0)
Basophils Absolute: 0 10*3/uL (ref 0.0–0.1)
EOS%: 2 % (ref 0.0–7.0)
HCT: 33.7 % — ABNORMAL LOW (ref 34.8–46.6)
HGB: 11.4 g/dL — ABNORMAL LOW (ref 11.6–15.9)
LYMPH%: 30.5 % (ref 14.0–49.7)
MCH: 31.6 pg (ref 25.1–34.0)
MCHC: 33.8 g/dL (ref 31.5–36.0)
MCV: 93.5 fL (ref 79.5–101.0)
MONO%: 10.2 % (ref 0.0–14.0)
NEUT%: 56.9 % (ref 38.4–76.8)
Platelets: 399 10*3/uL (ref 145–400)

## 2010-03-27 LAB — COMPREHENSIVE METABOLIC PANEL
AST: 23 U/L (ref 0–37)
Albumin: 4.5 g/dL (ref 3.5–5.2)
Alkaline Phosphatase: 92 U/L (ref 39–117)
Calcium: 9.6 mg/dL (ref 8.4–10.5)
Chloride: 103 mEq/L (ref 96–112)
Potassium: 4.4 mEq/L (ref 3.5–5.3)
Sodium: 141 mEq/L (ref 135–145)
Total Protein: 7.7 g/dL (ref 6.0–8.3)

## 2010-03-27 LAB — MORPHOLOGY

## 2010-04-08 ENCOUNTER — Encounter: Payer: Self-pay | Admitting: Internal Medicine

## 2010-04-08 LAB — HM COLONOSCOPY: HM Colonoscopy: 2

## 2010-05-30 ENCOUNTER — Ambulatory Visit: Payer: Self-pay | Admitting: Internal Medicine

## 2010-05-30 LAB — CONVERTED CEMR LAB: Hgb A1c MFr Bld: 6.5 %

## 2010-07-16 ENCOUNTER — Encounter: Admission: RE | Admit: 2010-07-16 | Discharge: 2010-07-16 | Payer: Self-pay | Admitting: Internal Medicine

## 2010-07-16 LAB — HM MAMMOGRAPHY

## 2010-12-16 ENCOUNTER — Ambulatory Visit
Admission: RE | Admit: 2010-12-16 | Discharge: 2010-12-16 | Payer: Self-pay | Source: Home / Self Care | Attending: Internal Medicine | Admitting: Internal Medicine

## 2010-12-19 LAB — CONVERTED CEMR LAB
ALT: 11 units/L (ref 0–35)
Albumin: 4.7 g/dL (ref 3.5–5.2)
Alkaline Phosphatase: 87 units/L (ref 39–117)
BUN: 54 mg/dL — ABNORMAL HIGH (ref 6–23)
CO2: 23 meq/L (ref 19–32)
Chloride: 103 meq/L (ref 96–112)
Glucose, Bld: 137 mg/dL — ABNORMAL HIGH (ref 70–99)
Potassium: 4.3 meq/L (ref 3.5–5.3)
Total Bilirubin: 0.6 mg/dL (ref 0.3–1.2)

## 2010-12-21 LAB — CONVERTED CEMR LAB
AST: 23 units/L (ref 0–37)
BUN: 40 mg/dL — ABNORMAL HIGH (ref 6–23)
Calcium, Total (PTH): 10.5 mg/dL (ref 8.4–10.5)
Calcium: 10.3 mg/dL (ref 8.4–10.5)
Chloride: 104 meq/L (ref 96–112)
Hgb A1c MFr Bld: 6.1 %
Microalb, Ur: 1.36 mg/dL (ref 0.00–1.89)
PTH: 4.5 pg/mL — ABNORMAL LOW (ref 14.0–72.0)
Potassium: 4.1 meq/L (ref 3.5–5.3)
Total Bilirubin: 0.9 mg/dL (ref 0.3–1.2)

## 2010-12-23 NOTE — Assessment & Plan Note (Signed)
Summary: est-ck/fu/meds/cfb   Vital Signs:  Patient profile:   77 year old female Height:      58 inches (147.32 cm) Weight:      148.8 pounds (67.64 kg) BMI:     31.21 Temp:     98.0 degrees F (36.67 degrees C) oral Pulse rate:   84 / minute BP sitting:   135 / 60  (right arm)  Vitals Entered By: Cynda Familia Duncan Dull) (May 30, 2010 10:48 AM) CC: routine f/u, med refill Is Patient Diabetic? Yes Did you bring your meter with you today? Yes Pain Assessment Patient in pain? no      Nutritional Status BMI of > 30 = obese  Have you ever been in a relationship where you felt threatened, hurt or afraid?No   Does patient need assistance? Functional Status Self care Ambulation Normal   CC:  routine f/u and med refill.  History of Present Illness: 54 woman with CLL in long remission and mild HTN. Last saw Dr. Darnelle Catalan in May and CLL was doing well.   He has referred her for screening on an aggressive basis.  Just had a colonoscopy with benign polyps.  Has annual mammography. Only complaint is from arthritis in left knee.  Had Baker's cyst in past and has occasional flares.  Has a firm swelling medial to knee, almost bony in consistency.  Non-tender and she insists smaller than a few weeks ago.  Colonoscopy  Procedure date:  04/08/2010  Findings:      Results: Polyp.  Location:  [facility].     Comments:      Repeat colon not likely to be needed, in view of patient's advanced age  Allergies: No Known Drug Allergies   Impression & Recommendations:  Problem # 1:  CHRONIC KIDNEY DISEASE STAGE II (MILD) (ICD-585.2) Last creatinine same as last year.  Cl cr = 22.  Mild anemia may be only consequence as yet.  Problem # 2:  DIABETES MELLITUS, TYPE II (ICD-250.00) A1c = 6.5.  BP = 135/60.  Lytes all OK in May: see scanned labs from Dr. Darnelle Catalan. Lungs and heart normal on exam.  No edema. Attends Dr. Elmer Picker (eye). LDL < 70 three times.    Her updated medication  list for this problem includes:    Cozaar 100 Mg Tabs (Losartan potassium) .[name]... Take 1 tablet by mouth once a day  Orders: T-Hgb A1C (in-house) (54098JX) T- Capillary Blood Glucose (91478)  Problem # 3:  UNSPECIFIED ARTHROPATHY, LOWER LEG (ICD-716.96) Her left knee pain is a long term discomfort.  She insists that tramadol APAP is effective and that she only takes it 2-3 times a week.  No inflammation on exam.  I would be reluctant to start NSAID with her renal function.  Complete Medication List: 1)  Cozaar 100 Mg Tabs (Losartan potassium) .... Take 1 tablet by mouth once a day 2)  Zocor 20 Mg Tabs (Simvastatin) .... Take 1 tablet by mouth once a day 3)  Aggrenox 25-200 Mg Cp12 (Aspirin-dipyridamole) .... Take 1 capsule by mouth two times a day 4)  Rituxan Conc (Rituximab conc) .... Directions as per dr. Darnelle Catalan 5)  Amlodipine Besylate 10 Mg Tabs (Amlodipine besylate) .... Take 1 tablet by mouth once a day 6)  Guaifenesin Ac 100-10 Mg/77ml Syrp (Guaifenesin-codeine) .[name].. 1 teaspoon every 4 hours as needed for cough 7)  Tramadol-acetaminophen 37.5-325 Mg Tabs (Tramadol-acetaminophen) .[name].. 1 or 2 tabs every 6 hours as needed for pain. 8)  Furosemide  40 Mg Tabs (Furosemide) .... Take 1 tablet by mouth once a day 9)  Omeprazole 20 Mg Cpdr (Omeprazole) .... Take 1 tablet by mouth once a day  Patient Instructions: 1)  Please schedule a follow-up appointment in 4 months. Prescriptions: AMLODIPINE BESYLATE 10 MG TABS (AMLODIPINE BESYLATE) Take 1 tablet by mouth once a day  #100 x 3   Entered by:   Cynda Familia (AAMA)   Authorized by:   Ulyess Mort MD   Signed by:   Ulyess Mort MD on 05/30/2010   Method used:   Print then Give to Patient   RxID:   [id number]   Laboratory Results   Blood Tests   Date/Time Received: May 30, 2010 11:14 AM  Date/Time Reported: Burke Keels  May 30, 2010 11:14 AM   HGBA1C: 6.5%   (Normal Range: Non-Diabetic - 3-6%   Control  Diabetic - 6-8%) CBG Fasting:: 124mg /dL      Prevention & Chronic Care Immunizations   Influenza vaccine: Fluvax 3+  (09/17/2009)    Tetanus booster: Not documented    Pneumococcal vaccine: Pneumovax (Medicare)  (12/09/2007)    H. zoster vaccine: Not documented  Colorectal Screening   Hemoccult: Not documented    Colonoscopy: Results: Polyp.  Location:  [facility].     (04/08/2010)   Colonoscopy action/deferral: Repeat colon not likely to be needed, in view of patient's advanced age  (04/08/2010)  Other Screening   Pap smear: Not documented    Mammogram: ASSESSMENT: Negative - BI-RADS 1^MM DIGITAL SCREENING  (07/18/2009)    DXA bone density scan: Not documented   Smoking status: quit  (01/14/2010)  Diabetes Mellitus   HgbA1C: 6.5  (05/30/2010)    Eye exam: No diabetic retinopathy.   No diabetic macular edema.   (03/03/2010)   Eye exam due: 02/2010    Foot exam: yes  (03/30/2008)   High risk foot: Not documented   Foot care education: Not documented    Urine microalbumin/creatinine ratio: 27.8  (01/14/2010)  Lipids   Total Cholesterol: 131  (09/17/2009)   LDL: 67  (09/17/2009)   LDL Direct: Not documented   HDL: 38  (09/17/2009)   Triglycerides: 132  (09/17/2009)    SGOT (AST): 23  (08/07/2008)   SGPT (ALT): 18  (08/07/2008)   Alkaline phosphatase: 68  (08/07/2008)   Total bilirubin: 0.9  (08/07/2008)  Hypertension   Last Blood Pressure: 135 / 60  (05/30/2010)   Serum creatinine: 1.52  (01/14/2010)   Serum potassium 4.2  (01/14/2010)  Self-Management Support :   Personal Goals (by the next clinic visit) :     Personal A1C goal: 7  (01/14/2010)     Personal blood pressure goal: 130/80  (01/14/2010)     Personal LDL goal: 100  (01/14/2010)    Patient will work on the following items until the next clinic visit to reach self-care goals:     Medications and monitoring: take my medicines every day  (05/30/2010)     Eating: eat foods that are low  in salt, eat baked foods instead of fried foods  (05/30/2010)    Diabetes self-management support: Pre-printed educational material, Resources for patients handout, Written self-care plan  (05/30/2010)   Diabetes care plan printed    Hypertension self-management support: Pre-printed educational material, Resources for patients handout, Written self-care plan  (05/30/2010)   Hypertension self-care plan printed.    Lipid self-management support: Pre-printed educational material, Resources for patients handout, Written self-care plan  (05/30/2010)  Lipid self-care plan printed.      Resource handout printed.

## 2010-12-23 NOTE — Assessment & Plan Note (Signed)
Summary: checkup/pcp-[facility]   Vital Signs:  Patient profile:   77 year old female Height:      58 inches (147.32 cm) Weight:      149.7 pounds (68.05 kg) Temp:     97.3 degrees F (36.28 degrees C) Pulse rate:   82 / minute BP sitting:   160 / 62  (left arm)  Vitals Entered By: Krystal Eaton Duncan Dull) (January 14, 2010 3:10 PM) CC: routine f/u, med refill Is Patient Diabetic? Yes Did you bring your meter with you today? No Pain Assessment Patient in pain? yes     Location: Bilateral knee Nutritional Status BMI of 25 - 29 = overweight CBG Result 100  Have you ever been in a relationship where you felt threatened, hurt or afraid?No   Does patient need assistance? Functional Status Self care Ambulation Normal   CC:  routine f/u and med refill.  History of Present Illness: 63 woman with HTN and CLL. As usual she has no complaints.  ROS is negative.     Preventive Screening-Counseling & Management  Alcohol-Tobacco     Alcohol type: very seldom     Smoking Status: quit     Year Quit: 1982  Allergies: No Known Drug Allergies  Physical Exam  Lungs:  no crackles and no wheezes.   Heart:  regular rhythm and no murmur.     Impression & Recommendations:  Problem # 1:  HYPERTENSION (ICD-401.9)  I get 160/40 with large cuff in right arm sitting. She shows me a card with 15 normal BPs recorded at 3 different drug stores. Because she does have 1+ edema, will increase lasix to 40mg . No CV symptoms.   Cor regular w/o murmur.  Lungs clear.   The following medications were removed from the medication list:    Furosemide 20 Mg Tabs (Furosemide) .[name]... Take 1 tablet by mouth once a day Her updated medication list for this problem includes:    Cozaar 100 Mg Tabs (Losartan potassium) .[name]... Take 1 tablet by mouth once a day    Amlodipine Besylate 10 Mg Tabs (Amlodipine besylate) .[name]... Take 1 tablet by mouth once a day    Furosemide 40 Mg Tabs (Furosemide) .[name]... Take 1  tablet by mouth once a day  Orders: T-Basic Metabolic Panel [phone])  Problem # 2:  DIABETES MELLITUS, TYPE II (ICD-250.00) Unclear whether she actually has DM.  Several A1c = 6.5 and all BGs are around 110.  Her updated medication list for this problem includes:    Cozaar 100 Mg Tabs (Losartan potassium) .[name]... Take 1 tablet by mouth once a day  Orders: T-Hgb A1C (in-house) ([id number]) T- Capillary Blood Glucose (65784) T-Urine Microalbumin w/creat. ratio ([phone])  Complete Medication List: 1)  Cozaar 100 Mg Tabs (Losartan potassium) .... Take 1 tablet by mouth once a day 2)  Zocor 20 Mg Tabs (Simvastatin) .... Take 1 tablet by mouth once a day 3)  Aggrenox 25-200 Mg Cp12 (Aspirin-dipyridamole) .... Take 1 capsule by mouth two times a day 4)  Rituxan Conc (Rituximab conc) .... Directions as per dr. Darnelle Catalan 5)  Amlodipine Besylate 10 Mg Tabs (Amlodipine besylate) .... Take 1 tablet by mouth once a day 6)  Guaifenesin Ac 100-10 Mg/83ml Syrp (Guaifenesin-codeine) .[name].. 1 teaspoon every 4 hours as needed for cough 7)  Omeprazole 40 Mg Cpdr (Omeprazole) .... Take 1 tablet by mouth once a day 8)  Tramadol-acetaminophen 37.5-325 Mg Tabs (Tramadol-acetaminophen) .[name].. 1 or 2 tabs every 6 hours as needed for pain.  9)  Furosemide 40 Mg Tabs (Furosemide) .... Take 1 tablet by mouth once a day  Patient Instructions: 1)  Please schedule a follow-up appointment in 4 months. Prescriptions: FUROSEMIDE 40 MG TABS (FUROSEMIDE) Take 1 tablet by mouth once a day  #100 x 3   Entered and Authorized by:   Ulyess Mort MD   Signed by:   Ulyess Mort MD on 01/14/2010   Method used:   Print then Give to Patient   RxID:   [id number] OMEPRAZOLE 40 MG CPDR (OMEPRAZOLE) Take 1 tablet by mouth once a day  #100 x 3   Entered and Authorized by:   Ulyess Mort MD   Signed by:   Ulyess Mort MD on 01/14/2010   Method used:   Print then Give to Patient   RxID:   [id number] AGGRENOX  25-200 MG CP12 (ASPIRIN-DIPYRIDAMOLE) Take 1 capsule by mouth two times a day  #200 x 3   Entered and Authorized by:   Ulyess Mort MD   Signed by:   Ulyess Mort MD on 01/14/2010   Method used:   Print then Give to Patient   RxID:   [id number] ZOCOR 20 MG TABS (SIMVASTATIN) Take 1 tablet by mouth once a day  #100 x 3   Entered and Authorized by:   Ulyess Mort MD   Signed by:   Ulyess Mort MD on 01/14/2010   Method used:   Print then Give to Patient   RxID:   [id number] COZAAR 100 MG TABS (LOSARTAN POTASSIUM) Take 1 tablet by mouth once a day  #100 x 3   Entered and Authorized by:   Ulyess Mort MD   Signed by:   Ulyess Mort MD on 01/14/2010   Method used:   Print then Give to Patient   RxID:   [id number]  Process Orders Check Orders Results:     [facility]: Check successful Tests Sent for requisitioning (January 14, 2010 4:50 PM):     01/14/2010: [facility] -- T-Basic Metabolic Panel [[id number]] (signed)     01/14/2010: [facility] -- T-Urine Microalbumin w/creat. ratio [[id number]] (signed)    Prevention & Chronic Care Immunizations   Influenza vaccine: Fluvax 3+  (09/17/2009)    Tetanus booster: Not documented    Pneumococcal vaccine: Pneumovax (Medicare)  (12/09/2007)    H. zoster vaccine: Not documented  Colorectal Screening   Hemoccult: Not documented    Colonoscopy: Not documented  Other Screening   Pap smear: Not documented    Mammogram: ASSESSMENT: Negative - BI-RADS 1^MM DIGITAL SCREENING  (07/18/2009)    DXA bone density scan: Not documented   Smoking status: quit  (01/14/2010)  Diabetes Mellitus   HgbA1C: 6.4  (01/14/2010)    Eye exam: No diabetic retinopathy.     (03/04/2009)   Eye exam due: 02/2010    Foot exam: yes  (03/30/2008)   High risk foot: Not documented   Foot care education: Not documented    Urine microalbumin/creatinine ratio: 10.0   (08/07/2008)  Lipids   Total Cholesterol: 131  (09/17/2009)   LDL: 67  (09/17/2009)   LDL Direct: Not documented   HDL: 38  (09/17/2009)   Triglycerides: 132  (09/17/2009)    SGOT (AST): 23  (08/07/2008)   SGPT (ALT): 18  (08/07/2008)   Alkaline phosphatase: 68  (08/07/2008)   Total bilirubin: 0.9  (08/07/2008)  Hypertension   Last Blood Pressure: 160 / 62  (01/14/2010)   Serum creatinine: 1.70  (05/03/2009)  Serum potassium 4.1  (05/03/2009)  Self-Management Support :   Personal Goals (by the next clinic visit) :     Personal A1C goal: 7  (01/14/2010)     Personal blood pressure goal: 130/80  (01/14/2010)     Personal LDL goal: 100  (01/14/2010)    Patient will work on the following items until the next clinic visit to reach self-care goals:     Medications and monitoring: take my medicines every day  (01/14/2010)     Eating: eat more vegetables, eat foods that are low in salt, eat baked foods instead of fried foods  (01/14/2010)    Diabetes self-management support: Resources for patients handout  (01/14/2010)    Hypertension self-management support: Resources for patients handout, Education handout  (01/14/2010)   Hypertension education handout printed    Lipid self-management support: Resources for patients handout  (01/14/2010)        Resource handout printed.  Laboratory Results   Blood Tests   Date/Time Received: January 14, 2010 3:16 PM  Date/Time Reported: Burke Keels  January 14, 2010 3:16 PM   HGBA1C: 6.4%   (Normal Range: Non-Diabetic - 3-6%   Control Diabetic - 6-8%) CBG Random:: 100mg /dL

## 2010-12-23 NOTE — Consult Note (Signed)
[facility]   Imported By: Margie Billet 06/02/2010 14:27:07  _____________________________________________________________________  External Attachment:    Type:   Image     Comment:   External Document

## 2010-12-23 NOTE — Miscellaneous (Signed)
  Clinical Lists Changes  Medications: Removed medication of OMEPRAZOLE 40 MG CPDR (OMEPRAZOLE) Take 1 tablet by mouth once a day Added new medication of OMEPRAZOLE 20 MG CPDR (OMEPRAZOLE) Take 1 tablet by mouth once a day - Signed Rx of OMEPRAZOLE 20 MG CPDR (OMEPRAZOLE) Take 1 tablet by mouth once a day;  #100 x 3;  Signed;  Entered by: Ulyess Mort MD;  Authorized by: Ulyess Mort MD;  Method used: Print then Mail to Patient    Prescriptions: OMEPRAZOLE 20 MG CPDR (OMEPRAZOLE) Take 1 tablet by mouth once a day  #100 x 3   Entered and Authorized by:   Ulyess Mort MD   Signed by:   Ulyess Mort MD on 02/27/2010   Method used:   Print then Mail to Patient   RxID:   [id number]  Dose reduced in response to a caution from her managed care company.  Appended Document:  Pt made of aware of change in rx and per her request it was mailed to address on file.

## 2010-12-23 NOTE — Procedures (Signed)
Summary: [facility]  [facility]   Imported By: Margie Billet 04/30/2010 14:36:08  _____________________________________________________________________  External Attachment:    Type:   Image     Comment:   External Document

## 2010-12-23 NOTE — Letter (Signed)
Summary: [facility]   Imported By: Margie Billet 03/04/2010 12:22:47  _____________________________________________________________________  External Attachment:    Type:   Image     Comment:   External Document  Appended Document: [facility]    Clinical Lists Changes  Observations: Added new observation of DIAB EYE EX: No diabetic retinopathy.   No diabetic macular edema.  (03/03/2010 16:13)       Diabetic Eye Exam  Procedure date:  03/03/2010  Findings:      No diabetic retinopathy.   No diabetic macular edema.

## 2010-12-23 NOTE — Letter (Signed)
Summary: [facility]   Imported By: Margie Billet 04/28/2010 14:54:19  _____________________________________________________________________  External Attachment:    Type:   Image     Comment:   External Document

## 2010-12-23 NOTE — Letter (Signed)
Summary: [facility]  [facility]   Imported By: Margie Billet 05/12/2010 10:26:45  _____________________________________________________________________  External Attachment:    Type:   Image     Comment:   External Document

## 2010-12-25 NOTE — Assessment & Plan Note (Signed)
Summary: FU VISIT/DS   Vital Signs:  Patient profile:   77 year old female Height:      58 inches (147.32 cm) Weight:      148.9 pounds (67.68 kg) BMI:     31.23 Temp:     99.6 degrees F oral Pulse rate:   80 / minute BP sitting:   145 / 58  (right arm)  Vitals Entered By: Cynda Familia Duncan Dull) (December 16, 2010 1:53 PM)     Is Patient Diabetic? Yes Did you bring your meter with you today? No Pain Assessment Patient in pain? no       Have you ever been in a relationship where you felt threatened, hurt or afraid?No   Does patient need assistance? Functional Status Self care Ambulation Normal   History of Present Illness: 18 woman with mild HT and DM. Has no new complaints.  Cheerful as always. Reviewed meds -- all as listed. Lungs clear.  Cor regular w/o murmur. Trace edema. Weight unchanged.  Reviewed problem list:  continues to have bilat knee pain but functioning well with mild analgesics. No signs of inflamation.  No calf tenderness.   Allergies: No Known Drug Allergies   Impression & Recommendations:  Problem # 1:  CHRONIC KIDNEY DISEASE STAGE II (MILD) (ICD-585.2) Unchanged over 2 years.  Time for annual Bmet.  Problem # 2:  HYPERTENSION (ICD-401.9) Up slightly but no CV symptoms.  Wgt same. Her updated medication list for this problem includes:    Cozaar 100 Mg Tabs (Losartan potassium) .[name]... Take 1 tablet by mouth once a day    Amlodipine Besylate 10 Mg Tabs (Amlodipine besylate) .[name]... Take 1 tablet by mouth once a day    Furosemide 40 Mg Tabs (Furosemide) .[name]... Take 1 tablet by mouth once a day  Problem # 3:  LEUKEMIA, LYMPHOCYTIC, CHRONIC (ICD-204.10) Continues to see Dr. Darnelle Catalan regularly.  Problem # 4:  HYPERLIPIDEMIA (ICD-272.4) Results have been so good and stable that I will check q 2 years.   Her updated medication list for this problem includes:    Zocor 20 Mg Tabs (Simvastatin) .[name]... Take 1 tablet by mouth once a day  Complete  Medication List: 1)  Cozaar 100 Mg Tabs (Losartan potassium) .... Take 1 tablet by mouth once a day 2)  Zocor 20 Mg Tabs (Simvastatin) .... Take 1 tablet by mouth once a day 3)  Aggrenox 25-200 Mg Cp12 (Aspirin-dipyridamole) .... Take 1 capsule by mouth two times a day 4)  Rituxan Conc (Rituximab conc) .... Directions as per dr. Darnelle Catalan 5)  Amlodipine Besylate 10 Mg Tabs (Amlodipine besylate) .... Take 1 tablet by mouth once a day 6)  Guaifenesin Ac 100-10 Mg/63ml Syrp (Guaifenesin-codeine) .[name].. 1 teaspoon every 4 hours as needed for cough 7)  Tramadol-acetaminophen 37.5-325 Mg Tabs (Tramadol-acetaminophen) .[name].. 1 or 2 tabs every 6 hours as needed for pain. 8)  Furosemide 40 Mg Tabs (Furosemide) .... Take 1 tablet by mouth once a day 9)  Omeprazole 20 Mg Cpdr (Omeprazole) .... Take 1 tablet by mouth once a day  Other Orders: T- Capillary Blood Glucose (69629) T-Hgb A1C (in-house) (52841LK) T-CMP with Estimated GFR ([address])  Patient Instructions: 1)  Please schedule a follow-up appointment in 6 months. Prescriptions: AGGRENOX 25-200 MG CP12 (ASPIRIN-DIPYRIDAMOLE) Take 1 capsule by mouth two times a day  #200 x 3   Entered by:   Cynda Familia (AAMA)   Authorized by:   Ulyess Mort MD   Signed by:   Purnell Shoemaker  Goldston,CMA (AAMA) on 12/16/2010   Method used:   Print then Give to Patient   RxID:   [id number] OMEPRAZOLE 20 MG CPDR (OMEPRAZOLE) Take 1 tablet by mouth once a day  #100 x 3   Entered by:   Cynda Familia (AAMA)   Authorized by:   Ulyess Mort MD   Signed by:   Cynda Familia (AAMA) on 12/16/2010   Method used:   Print then Give to Patient   RxID:   [id number] FUROSEMIDE 40 MG TABS (FUROSEMIDE) Take 1 tablet by mouth once a day  #100 x 3   Entered by:   Cynda Familia (AAMA)   Authorized by:   Ulyess Mort MD   Signed by:   Cynda Familia (AAMA) on 12/16/2010   Method used:   Print then Give to Patient   RxID:   [id number] AMLODIPINE  BESYLATE 10 MG TABS (AMLODIPINE BESYLATE) Take 1 tablet by mouth once a day  #100 x 3   Entered by:   Cynda Familia (AAMA)   Authorized by:   Ulyess Mort MD   Signed by:   Cynda Familia (AAMA) on 12/16/2010   Method used:   Print then Give to Patient   RxID:   [id number] ZOCOR 20 MG TABS (SIMVASTATIN) Take 1 tablet by mouth once a day  #100 x 3   Entered by:   Cynda Familia (AAMA)   Authorized by:   Ulyess Mort MD   Signed by:   Cynda Familia (AAMA) on 12/16/2010   Method used:   Print then Give to Patient   RxID:   [id number] COZAAR 100 MG TABS (LOSARTAN POTASSIUM) Take 1 tablet by mouth once a day  #100 x 3   Entered by:   Cynda Familia (AAMA)   Authorized by:   Ulyess Mort MD   Signed by:   Cynda Familia (AAMA) on 12/16/2010   Method used:   Print then Give to Patient   RxID:   [id number]    Orders Added: 1)  T- Capillary Blood Glucose [82948] 2)  T-Hgb A1C (in-house) [60737TG] 3)  T-CMP with Estimated GFR [[id number]] 4)  Est. Patient Level III [62694]   Process Orders Check Orders Results:     [facility]: Check successful Tests Sent for requisitioning (December 16, 2010 3:20 PM):     12/16/2010: [facility] -- T-CMP with Estimated GFR [[id number]] (signed)     Laboratory Results   Blood Tests   Date/Time Received: December 16, 2010 2:15 PM Date/Time Reported: Alric Quan  December 16, 2010 2:15 PM  HGBA1C: 6.4%   (Normal Range: Non-Diabetic - 3-6%   Control Diabetic - 6-8%) CBG Fasting:: 129mg /dL     Prevention & Chronic Care Immunizations   Influenza vaccine: Fluvax 3+  (09/17/2009)    Tetanus booster: Not documented    Pneumococcal vaccine: Pneumovax (Medicare)  (12/09/2007)    H. zoster vaccine: Not documented  Colorectal Screening   Hemoccult: Not documented   Hemoccult action/deferral: Not indicated  (12/16/2010)    Colonoscopy: Results: Polyp.  Location:   [facility].     (04/08/2010)   Colonoscopy action/deferral: Repeat colon not likely to be needed, in view of patient's advanced age  (04/08/2010)  Other Screening   Pap smear: Not documented   Pap smear action/deferral: Not indicated-other  (12/16/2010)    Mammogram: ASSESSMENT: Negative - BI-RADS 1^MM DIGITAL SCREENING  (07/16/2010)    DXA bone density scan: Not documented   Smoking status: quit  (01/14/2010)  Diabetes Mellitus   HgbA1C: 6.4  (12/16/2010)    Eye exam: No diabetic retinopathy.   No diabetic macular edema.   (03/03/2010)   Eye exam due: 02/2010    Foot exam: yes  (03/30/2008)   High risk foot: Not documented   Foot care education: Not documented    Urine microalbumin/creatinine ratio: 27.8  (01/14/2010)    Diabetes flowsheet reviewed?: Yes   Progress toward A1C goal: At goal  Lipids   Total Cholesterol: 131  (09/17/2009)   LDL: 67  (09/17/2009)   LDL Direct: Not documented   HDL: 38  (09/17/2009)   Triglycerides: 132  (09/17/2009)    SGOT (AST): 23  (08/07/2008)   SGPT (ALT): 18  (08/07/2008)   Alkaline phosphatase: 68  (08/07/2008)   Total bilirubin: 0.9  (08/07/2008)    Lipid flowsheet reviewed?: Yes   Progress toward LDL goal: At goal   Lipid comments: will check every 2 years  Hypertension   Last Blood Pressure: 145 / 58  (12/16/2010)   Serum creatinine: 1.52  (01/14/2010)   Serum potassium 4.2  (01/14/2010)    Hypertension flowsheet reviewed?: Yes   Progress toward BP goal: At goal   Hypertension comments: close.  will not add another med.  Self-Management Support :   Personal Goals (by the next clinic visit) :     Personal A1C goal: 7  (01/14/2010)     Personal blood pressure goal: 130/80  (01/14/2010)     Personal LDL goal: 100  (01/14/2010)    Diabetes self-management support: Pre-printed educational material, Resources for patients handout, Written self-care plan  (05/30/2010)    Hypertension self-management support:  Pre-printed educational material, Resources for patients handout, Written self-care plan  (05/30/2010)    Lipid self-management support: Pre-printed educational material, Resources for patients handout, Written self-care plan  (05/30/2010)    Process Orders Check Orders Results:     [facility]: Check successful Tests Sent for requisitioning (December 16, 2010 3:20 PM):     12/16/2010: [facility] -- T-CMP with Estimated GFR [[id number]] (signed)

## 2011-01-01 ENCOUNTER — Encounter: Payer: Self-pay | Admitting: Internal Medicine

## 2011-03-10 LAB — GLUCOSE, CAPILLARY: Glucose-Capillary: 111 mg/dL — ABNORMAL HIGH (ref 70–99)

## 2011-03-18 ENCOUNTER — Encounter: Payer: Self-pay | Admitting: Internal Medicine

## 2011-03-26 ENCOUNTER — Other Ambulatory Visit: Payer: Self-pay | Admitting: Oncology

## 2011-03-26 ENCOUNTER — Encounter (HOSPITAL_BASED_OUTPATIENT_CLINIC_OR_DEPARTMENT_OTHER): Payer: Medicare Other | Admitting: Oncology

## 2011-03-26 DIAGNOSIS — C8599 Non-Hodgkin lymphoma, unspecified, extranodal and solid organ sites: Secondary | ICD-10-CM

## 2011-03-26 DIAGNOSIS — C9110 Chronic lymphocytic leukemia of B-cell type not having achieved remission: Secondary | ICD-10-CM

## 2011-03-26 LAB — COMPREHENSIVE METABOLIC PANEL
ALT: 12 U/L (ref 0–35)
AST: 18 U/L (ref 0–37)
CO2: 22 mEq/L (ref 19–32)
Calcium: 10 mg/dL (ref 8.4–10.5)
Chloride: 102 mEq/L (ref 96–112)
Creatinine, Ser: 1.65 mg/dL — ABNORMAL HIGH (ref 0.40–1.20)
Sodium: 142 mEq/L (ref 135–145)
Total Protein: 7.4 g/dL (ref 6.0–8.3)

## 2011-03-26 LAB — CBC & DIFF AND RETIC
BASO%: 0.3 % (ref 0.0–2.0)
EOS%: 1.5 % (ref 0.0–7.0)
HCT: 34.8 % (ref 34.8–46.6)
LYMPH%: 36.2 % (ref 14.0–49.7)
MCH: 30.2 pg (ref 25.1–34.0)
MCHC: 33.3 g/dL (ref 31.5–36.0)
MCV: 90.6 fL (ref 79.5–101.0)
MONO%: 7.7 % (ref 0.0–14.0)
NEUT%: 54.3 % (ref 38.4–76.8)
Platelets: 329 10*3/uL (ref 145–400)
RBC: 3.84 10*6/uL (ref 3.70–5.45)
Retic %: 1.22 % (ref 0.50–1.50)
WBC: 11.6 10*3/uL — ABNORMAL HIGH (ref 3.9–10.3)

## 2011-03-26 LAB — MORPHOLOGY: PLT EST: ADEQUATE

## 2011-04-02 ENCOUNTER — Encounter (HOSPITAL_BASED_OUTPATIENT_CLINIC_OR_DEPARTMENT_OTHER): Payer: Medicare Other | Admitting: Oncology

## 2011-04-02 DIAGNOSIS — C9110 Chronic lymphocytic leukemia of B-cell type not having achieved remission: Secondary | ICD-10-CM

## 2011-04-10 NOTE — Procedures (Signed)
[place]  Patient:    [name], [name]                        MRN: [id number] Proc. Date: 04/01/01 Attending:  Florencia Reasons, M.D. CC:         Valentino Hue. Magrinat, M.D.  Gary Fleet, M.D.   Procedure Report  PROCEDURE:  Colonoscopy with polypectomy.  INDICATION:  A 77 year old female with family history of colorectal cancer at a fairly early age.  FINDINGS:  Multiple small polyps removed.  DESCRIPTION OF PROCEDURE:  The nature, purpose, and risks of the procedure had been discussed with the patient, who provided written consent.  Sedation was fentanyl 50 mcg and Versed 7 mg IV without arrhythmias or desaturation.  The Olympus adjustable-tension pediatric video colonoscope was advanced without much difficulty to the cecum, and pullback was then performed.  On the way in, at about 16 cm, I encountered a firm, semipedunculated 5 mm polyp, removed by snare technique with complete hemostasis and no evidence of excessive cautery.  Other polyps removed included a 5 mm hyperplastic-appearing polyp snared in the proximal ascending colon, a diminutive polyp cold biopsied near the hepatic flexure, and a couple of hyperplastic-appearing diminutive sessile polyps at 9 and 18 cm.  No large polyps, cancer, colitis, vascular malformations, or diverticulosis were noted.  Retroflexion was not performed in the rectum due to the proximity of the rectal polyp biopsy.  The patient tolerated the procedure well and without apparent complications.  IMPRESSION:  Small colon polyps removed as described above.  PLAN:  Await pathology on the polyps.  If the patient remains in good general health, consideration could be given to a follow-up exam in three years. DD:  04/01/01 TD:  04/02/01 Job: [id number]

## 2011-04-10 NOTE — Op Note (Signed)
[name], [name]               ACCOUNT NO.:  [id number]   MEDICAL RECORD NO.:  [id number]          PATIENT TYPE:  AMB   LOCATION:  ENDO                         FACILITY:  [facility]   PHYSICIAN:  Bernette Redbird, M.D.   DATE OF BIRTH:  November 17, 1921   DATE OF PROCEDURE:  09/22/2004  DATE OF DISCHARGE:                                 OPERATIVE REPORT   PROCEDURE:  Colonoscopy.   SURGEON:   INDICATIONS FOR PROCEDURE:  Follow-up of two small colonic adenomas removed  three and a half years ago, and family history of colon cancer.   FINDINGS:  Normal examination.   DESCRIPTION OF PROCEDURE:  The nature, purpose and risks of the procedure  had been discussed with the patient who provided written consent.  Sedation  was fentanyl 70 mcg and Versed 7.5 mg IV without arrhythmias or  desaturation.  The Olympus adjustable tension pediatric video colonoscope  was advanced without too much difficulty around the colon to the cecum as  identified by clear visualization of appendiceal orifice and pull back was  then performed. The quality of the prep was excellent.  It was felt that all  areas were well seen.   This was a normal examination.  No polyps, cancer, colitis, vascular  malformations or diverticulosis were noted.  Retroflexion showed a  hypertrophied anal papilla.  Reinspection of the rectum was unremarkable.  No biopsies were obtained.  The patient tolerated the procedure well and  there were no apparent complications.   IMPRESSION:  Normal surveillance colonoscopy in the patient with a prior  history of colonic adenomas and a family history of colon cancer (V12.72).   PLAN:  In view of the patient's advanced age but overall good general  medical condition, I feel it is appropriate to consider her for a  surveillance colonoscopy in five years, provided that she remains in  generally good health in the interim.  We will put her on our follow-up list  for that purpose.       RB/MEDQ  D:  09/22/2004  T:  09/22/2004  Job:  [id number]   cc:   Valentino Hue. Magrinat, M.D.  [address] [facility]  [place]  [place] [address]  Fax: [phone]   C. Ulyess Mort, M.D.  [address]  [place]  [place] [address]  Fax: [phone]

## 2011-05-01 ENCOUNTER — Encounter: Payer: Self-pay | Admitting: Internal Medicine

## 2011-05-01 ENCOUNTER — Ambulatory Visit (INDEPENDENT_AMBULATORY_CARE_PROVIDER_SITE_OTHER): Payer: Medicare Other | Admitting: Internal Medicine

## 2011-05-01 VITALS — BP 149/63 | HR 65 | Temp 98.2°F | Ht <= 58 in | Wt 148.3 lb

## 2011-05-01 DIAGNOSIS — E119 Type 2 diabetes mellitus without complications: Secondary | ICD-10-CM

## 2011-05-01 DIAGNOSIS — I10 Essential (primary) hypertension: Secondary | ICD-10-CM

## 2011-05-01 LAB — GLUCOSE, CAPILLARY: Glucose-Capillary: 117 mg/dL — ABNORMAL HIGH (ref 70–99)

## 2011-05-01 NOTE — Assessment & Plan Note (Signed)
no CV sx and exam normal.  Labs fine last month.  Creat of 1.65 is actually down.

## 2011-05-01 NOTE — Progress Notes (Signed)
43 woman doing very well with few complaints.  Only notes arthralgia of knees with house work.  Responds well to occasional tramadol. Wgt and V.S. stable.  A1c = 6.0 (on no DM meds).  Sees eye doctor. Saw Dr. Darnelle Catalan last month for CLL and doing very well.  CBC and chemistries all acceptable or unchanged. Lungs clear.  Cor regular w/o murmur.  No edema. Has a 1.5 cm pigmented lesion of left temple -- Has been seen by dermatologist.  Apparantly a benign keratosis.

## 2011-07-10 ENCOUNTER — Other Ambulatory Visit: Payer: Self-pay | Admitting: Internal Medicine

## 2011-07-10 DIAGNOSIS — Z1231 Encounter for screening mammogram for malignant neoplasm of breast: Secondary | ICD-10-CM

## 2011-07-21 ENCOUNTER — Ambulatory Visit: Payer: Medicare Other

## 2011-08-13 ENCOUNTER — Ambulatory Visit
Admission: RE | Admit: 2011-08-13 | Discharge: 2011-08-13 | Disposition: A | Discharge status: Home / Self Care | Payer: Medicare Other | Source: Ambulatory Visit | Attending: Internal Medicine | Admitting: Internal Medicine

## 2011-08-13 DIAGNOSIS — Z1231 Encounter for screening mammogram for malignant neoplasm of breast: Secondary | ICD-10-CM

## 2011-10-29 ENCOUNTER — Telehealth: Payer: Self-pay | Admitting: *Deleted

## 2011-10-29 NOTE — Telephone Encounter (Signed)
Patient confirmed over the phone the new date and time of the new date and time in 2013

## 2011-11-06 ENCOUNTER — Ambulatory Visit (INDEPENDENT_AMBULATORY_CARE_PROVIDER_SITE_OTHER): Payer: Medicare Other | Admitting: Internal Medicine

## 2011-11-06 ENCOUNTER — Encounter: Payer: Self-pay | Admitting: Internal Medicine

## 2011-11-06 VITALS — BP 143/60 | HR 87 | Temp 98.6°F | Wt 150.2 lb

## 2011-11-06 DIAGNOSIS — C9110 Chronic lymphocytic leukemia of B-cell type not having achieved remission: Secondary | ICD-10-CM

## 2011-11-06 DIAGNOSIS — E119 Type 2 diabetes mellitus without complications: Secondary | ICD-10-CM

## 2011-11-06 DIAGNOSIS — I499 Cardiac arrhythmia, unspecified: Secondary | ICD-10-CM

## 2011-11-06 DIAGNOSIS — I6529 Occlusion and stenosis of unspecified carotid artery: Secondary | ICD-10-CM

## 2011-11-06 DIAGNOSIS — I10 Essential (primary) hypertension: Secondary | ICD-10-CM

## 2011-11-06 LAB — COMPREHENSIVE METABOLIC PANEL
ALT: 12 U/L (ref 0–35)
AST: 19 U/L (ref 0–37)
Albumin: 4.8 g/dL (ref 3.5–5.2)
Alkaline Phosphatase: 89 U/L (ref 39–117)
BUN: 33 mg/dL — ABNORMAL HIGH (ref 6–23)
Creat: 1.93 mg/dL — ABNORMAL HIGH (ref 0.50–1.10)
Potassium: 4.5 mEq/L (ref 3.5–5.3)

## 2011-11-06 LAB — POCT GLYCOSYLATED HEMOGLOBIN (HGB A1C): Hemoglobin A1C: 6.8

## 2011-11-06 LAB — GLUCOSE, CAPILLARY: Glucose-Capillary: 142 mg/dL — ABNORMAL HIGH (ref 70–99)

## 2011-11-06 MED ORDER — ASPIRIN-DIPYRIDAMOLE ER 25-200 MG PO CP12: 1.0000 | ORAL_CAPSULE | Freq: Two times a day (BID) | ORAL | Status: DC

## 2011-11-06 MED ORDER — AMLODIPINE BESYLATE 10 MG PO TABS: 10.0000 mg | ORAL_TABLET | Freq: Every day | ORAL | Status: DC

## 2011-11-06 MED ORDER — FUROSEMIDE 40 MG PO TABS: 40.0000 mg | ORAL_TABLET | Freq: Two times a day (BID) | ORAL | Status: DC

## 2011-11-06 MED ORDER — SIMVASTATIN 20 MG PO TABS: 20.0000 mg | ORAL_TABLET | Freq: Every day | ORAL | Status: DC

## 2011-11-06 MED ORDER — OMEPRAZOLE 20 MG PO CPDR: 20.0000 mg | DELAYED_RELEASE_CAPSULE | Freq: Every day | ORAL | Status: DC

## 2011-11-06 NOTE — Assessment & Plan Note (Addendum)
no CV sx.  Cor regular w/o murmur..  lungs clear. Last  chems in May.  Creat 1.65 (down from 1.80).  Repeat. [name]

## 2011-11-06 NOTE — Assessment & Plan Note (Signed)
regular today

## 2011-11-06 NOTE — Assessment & Plan Note (Signed)
on aggrenox.  No neurologic sx.

## 2011-11-06 NOTE — Assessment & Plan Note (Signed)
annual visits to Dr. Darnelle Catalan.  CBCs have been stable.

## 2011-11-06 NOTE — Progress Notes (Signed)
32 woman with mild HTN and mild DM. Only complaint is knee pain -- unchanged, responds partially to tramadol. Has crepitus but no inflammation.

## 2011-11-06 NOTE — Assessment & Plan Note (Signed)
no meds. a1c = 6.8.  urine alb is normal.  regular eye appointments. LDL  = 67.

## 2012-03-20 ENCOUNTER — Emergency Department (HOSPITAL_COMMUNITY)
Admission: EM | Admit: 2012-03-20 | Discharge: 2012-03-20 | Disposition: A | Discharge status: Home / Self Care | Payer: Medicare Other | Attending: Emergency Medicine | Admitting: Emergency Medicine

## 2012-03-20 ENCOUNTER — Encounter (HOSPITAL_COMMUNITY): Payer: Self-pay | Admitting: *Deleted

## 2012-03-20 ENCOUNTER — Emergency Department (HOSPITAL_COMMUNITY): Payer: Medicare Other

## 2012-03-20 DIAGNOSIS — I129 Hypertensive chronic kidney disease with stage 1 through stage 4 chronic kidney disease, or unspecified chronic kidney disease: Secondary | ICD-10-CM | POA: Insufficient documentation

## 2012-03-20 DIAGNOSIS — S42309A Unspecified fracture of shaft of humerus, unspecified arm, initial encounter for closed fracture: Secondary | ICD-10-CM

## 2012-03-20 DIAGNOSIS — M25519 Pain in unspecified shoulder: Secondary | ICD-10-CM | POA: Insufficient documentation

## 2012-03-20 DIAGNOSIS — C9110 Chronic lymphocytic leukemia of B-cell type not having achieved remission: Secondary | ICD-10-CM | POA: Insufficient documentation

## 2012-03-20 DIAGNOSIS — S42293A Other displaced fracture of upper end of unspecified humerus, initial encounter for closed fracture: Secondary | ICD-10-CM | POA: Insufficient documentation

## 2012-03-20 DIAGNOSIS — N182 Chronic kidney disease, stage 2 (mild): Secondary | ICD-10-CM | POA: Insufficient documentation

## 2012-03-20 DIAGNOSIS — E785 Hyperlipidemia, unspecified: Secondary | ICD-10-CM | POA: Insufficient documentation

## 2012-03-20 DIAGNOSIS — W010XXA Fall on same level from slipping, tripping and stumbling without subsequent striking against object, initial encounter: Secondary | ICD-10-CM | POA: Insufficient documentation

## 2012-03-20 DIAGNOSIS — M79609 Pain in unspecified limb: Secondary | ICD-10-CM | POA: Insufficient documentation

## 2012-03-20 DIAGNOSIS — E119 Type 2 diabetes mellitus without complications: Secondary | ICD-10-CM | POA: Insufficient documentation

## 2012-03-20 DIAGNOSIS — S42213A Unspecified displaced fracture of surgical neck of unspecified humerus, initial encounter for closed fracture: Secondary | ICD-10-CM | POA: Insufficient documentation

## 2012-03-20 MED ORDER — HYDROCODONE-ACETAMINOPHEN 5-500 MG PO TABS: 1.0000 | ORAL_TABLET | Freq: Four times a day (QID) | ORAL | Status: AC | PRN

## 2012-03-20 MED ORDER — HYDROCODONE-ACETAMINOPHEN 5-325 MG PO TABS
1.0000 | ORAL_TABLET | Freq: Once | ORAL | Status: AC
  Administered 2012-03-20: 1 via ORAL
  Filled 2012-03-20: qty 1

## 2012-03-20 NOTE — ED Provider Notes (Signed)
History     CSN: [id number]  Arrival date & time 03/20/12  1335   First MD Initiated Contact with Patient 03/20/12 1406      Chief Complaint  Patient presents with  . Shoulder Pain    RT  . Leg Pain    RT    (Consider location/radiation/quality/duration/timing/severity/associated sxs/prior treatment) Patient is a 78 y.o. female presenting with shoulder pain. The history is provided by the patient.  Shoulder Pain Pertinent negatives include no chest pain, no abdominal pain, no headaches and no shortness of breath.  pt c/o right shoulder pain s/p fall yesterday. States tripped, fell forward. Denies feeling at all faint or dizzy. Denies loc. Denies head injury or headache. C/o constant, dull pain to right shoulder worse w palpation and movement. No neck or back pain. No numbness/tingling. Skin intact. Bruising to area of right shoulder. Denies other pain or injury. Has been ambulatory since.   Past Medical History  Diagnosis Date  . Diabetes mellitus   . Hyperlipidemia   . Hypertension   . Carotid stenosis, right 08/2005    40-60%  . History of colonic polyps 2002    Normal colonoscopy 08/2004  . CKD (chronic kidney disease) stage 2, GFR 60-89 ml/min   . CLL (chronic lymphocytic leukemia)     Dr. Darnelle Catalan. s/p Rituxan  . Pulse irregularity     Etiology->frequent PACs, normal EKG    History reviewed. No pertinent past surgical history.  No family history on file.  History  Substance Use Topics  . Smoking status: Former Games developer  . Smokeless tobacco: Not on file  . Alcohol Use: Not on file    OB History    Grav Para Term Preterm Abortions TAB SAB Ect Mult Living                  Review of Systems  Constitutional: Negative for fever.  HENT: Negative for neck pain.   Eyes: Negative for visual disturbance.  Respiratory: Negative for shortness of breath.   Cardiovascular: Negative for chest pain.  Gastrointestinal: Negative for abdominal pain.  Genitourinary:  Negative for flank pain.  Musculoskeletal: Negative for back pain.  Skin: Negative for wound.  Neurological: Negative for weakness, numbness and headaches.  Psychiatric/Behavioral: Negative for confusion.  All other systems reviewed and are negative.    Allergies  Review of patient's allergies indicates no known allergies.  Home Medications   Current Outpatient Rx  Name Route Sig Dispense Refill  . ACETAMINOPHEN 500 MG PO TABS Oral Take 1,000 mg by mouth 2 (two) times daily as needed. For pain    . AMLODIPINE BESYLATE 10 MG PO TABS Oral Take 1 tablet (10 mg total) by mouth daily. 100 tablet 3  . ASPIRIN EC 81 MG PO TBEC Oral Take 81 mg by mouth daily.    [name] CALCIUM PLUS VITAMIN D PO Oral Take 1 tablet by mouth 2 (two) times daily.    [name] CRANBERRY 450 MG PO CAPS Oral Take 1 capsule by mouth daily.    . ASPIRIN-DIPYRIDAMOLE ER 25-200 MG PO CP12 Oral Take 1 capsule by mouth 2 (two) times daily. 200 capsule 3  . FUROSEMIDE 40 MG PO TABS Oral Take 1 tablet (40 mg total) by mouth 2 (two) times daily. 100 tablet 3  . GNP GARLIC EXTRACT PO Oral Take 1 tablet by mouth daily.    [name] LOSARTAN POTASSIUM 100 MG PO TABS Oral Take 100 mg by mouth daily.      [name]  MACULAR VITAMIN BENEFIT PO Oral Take 1 tablet by mouth 2 (two) times daily.    Carma Leaven M PLUS PO TABS Oral Take 1 tablet by mouth daily.    [name] FISH OIL 1200 MG PO CAPS Oral Take 1 capsule by mouth daily.    [name] OMEPRAZOLE 20 MG PO CPDR Oral Take 1 capsule (20 mg total) by mouth daily. 100 capsule 3  . SYSTANE OP Both Eyes Place 1 drop into both eyes 2 (two) times daily.    [name] SIMVASTATIN 20 MG PO TABS Oral Take 1 tablet (20 mg total) by mouth at bedtime. 100 tablet 3  . RITUXIMAB CHEMO INJECTION 10 MG/ML  Administered by Dr. Darnelle Catalan     . TRAMADOL-ACETAMINOPHEN 37.5-325 MG PO TABS Oral Take 1-2 tablets by mouth every 6 (six) hours as needed.        BP 143/57  Pulse 91  Temp(Src) 98 F (36.7 C) (Oral)  Resp 18  SpO2 96%  Physical Exam    Nursing note and vitals reviewed. Constitutional: She is oriented to person, place, and time. She appears well-developed and well-nourished. No distress.  HENT:  Head: Atraumatic.  Eyes: Conjunctivae are normal. Pupils are equal, round, and reactive to light. No scleral icterus.  Neck: Normal range of motion. Neck supple. No tracheal deviation present.  Cardiovascular: Normal rate, normal heart sounds and intact distal pulses.   Pulmonary/Chest: Effort normal and breath sounds normal. No respiratory distress. She exhibits no tenderness.  Abdominal: Soft. Normal appearance. She exhibits no distension. There is no tenderness.  Musculoskeletal:       Swelling right shoulder. Tenderness to area. Limited rom right shoulder due to pain. Good rom at right wrist and elbow without pain. No other focal bony tenderness noted on bilateral ext exam. Radial pulse 2+. RUE nvi. Bruising noted right shoulder/upper arm.   Neurological: She is alert and oriented to person, place, and time.       Motor intact bil. RUE nvi.   Skin: Skin is warm and dry. No rash noted.  Psychiatric: She has a normal mood and affect.    ED Course  Procedures (including critical care time)  Labs Reviewed - No data to display Dg Shoulder Right  03/20/2012  *RADIOLOGY REPORT*  Clinical Data: Right arm pain and bruising following a fall yesterday.  RIGHT SHOULDER - 2+ VIEW  Comparison: Chest dated 03/28/2009.  Findings: Interval comminuted, impacted fracture of the right humeral head and neck.  Mild anterior and medial displacement of the distal fragment as well as mild posterior and lateral angulation of the distal fragment.  Extensive rotator cuff calcifications.  Moderate superior acromioclavicular spur formation.  IMPRESSION:  1.  Comminuted, impacted fracture of the right humeral head and neck, as described above. 2.  Extensive rotator cuff calcific tendonitis.  Original Report Authenticated By: Darrol Angel, M.D.         MDM  Xrays, reviewed nursing notes. vicodin po. Shoulder immobilizer. Icepack.   Discussed xrays w pt and need for ortho f/u, subsequent pt/ot. Pt/spouse states currently has no orthopedist - will refer to on call ortho.         Suzi Roots, MD 03/20/12 [phone]

## 2012-03-20 NOTE — Progress Notes (Signed)
Orthopedic Tech Progress Note Patient Details:  [name] [birth year] [id number]  Other Ortho Devices Type of Ortho Device: Other (comment) (sling immobilizer) Ortho Device Location: right arm Ortho Device Interventions: Application   Nikki Dom 03/20/2012, 3:41 PM

## 2012-03-20 NOTE — Progress Notes (Signed)
Orthopedic Tech Progress Note Patient Details:  [name] 1921-07-16 [id number]  Patient ID: [name], female   DOB: March 16, 1921, 78 y.o.   MRN: [id number] Unable to complete order due to order already completed;order has been re entered but new order does not appear for ortho orders   Nikki Dom 03/20/2012, 3:41 PM

## 2012-03-20 NOTE — ED Notes (Signed)
PT reports she tripped and fell on SAT. Pt now has pain RT shoulder and RT leg. Pt does take Aggrenox.

## 2012-03-20 NOTE — Discharge Instructions (Signed)
You may take vicodin as need for pain. No driving for the next 6 hours or when taking vicodin. Also, do not take tylenol or acetaminophen containing medication when taking vicodin. Icepack/cold to sore area. Wear shoulder immobilizer. Follow up with orthopedist in this coming week - see referral - call office tomorrow morning to arrange appointment. Return to ER if worse, worsening/severe pain, numbness/weakness, new pain/symptoms, other concern.      Shoulder Fracture (Proximal Humerus or Glenoid) A shoulder fracture is a broken upper arm bone or a broken socket bone. The humerus is the upper arm bone and the glenoid is the shoulder socket. Proximal means the humerus is broken near the shoulder. Most of the time the bones of a broken shoulder are in an acceptable position. Usually, the injury can be treated with a shoulder immobilizer or sling and swath bandage. These devices support the arm and prevent any shoulder movement. If the bones are not in a good position, then surgery is sometimes needed. Shoulder fractures usually initially cause swelling, pain, and discoloration around the upper arm. They heal in 8 to 12 weeks with proper treatment. SYMPTOMS  At the time of injury:  Pain.   Tenderness.   Regular body contours are not normal.  Later symptoms may include:  Swelling and bruising of the elbow and hand.   Swelling and bruising of the arm or chest.  Other symptoms include:  Pain when lifting or turning the arm.   Paralysis below the fracture.   Numbness or coldness below the fracture.  CAUSES   Indirect force from falling on an outstretched arm.   A blow to the shoulder.  RISK INCREASES WITH:  Not being in shape.   Playing contact sports, such as football, soccer, hockey, or rugby.   Sports where falling on an outstretched arm occurs, such as basketball, skateboarding, or volleyball.   History of bone or joint disease.   History of shoulder injury.   PREVENTION  Warm up before activity.   Stretch before activity.   Stay in shape with your:   Heart fitness.   Flexibility.   Shoulder Strength.   Falling with the proper technique.  PROGNOSIS  In adults, healing time is about 7 weeks. For children, healing time is about 5 weeks. Surgery may be needed. RELATED COMPLICATIONS  The bones do not heal together (nonunion).   The bones do not align properly when they heal (malunion).   Long-term problems with pain, stiffness, swelling, or loss of motion.   The injured arm heals shorter than the other.   Nerves are injured in the arm.   Arthritis in the shoulder.   Normal bone growth is interrupted in children.   Blood supply to the shoulder joint is diminished.  TREATMENT If the bones are aligned, then initial treatment will be with ice and medicine to help with pain. The shoulder will be held in place with a sling (immobilization). The shoulder will be allowed to heal for up to 6 weeks. Injuries that may need surgery include:  Severe fractures.   Fractures that are not in appropriate alignment (displaced).   Non-displaced fractures (not common).  Surgery helps the bones align correctly. The bones may be held in place with:  Sutures.   Wires.   Rods.   Plates.   Screws.   Pins.  If you have had surgery or not, you will likely be assisted by a physical therapist or athletic trainer to get the best results with your injured  shoulder. This will likely include exercises to strengthen and stretch the injured and surrounding areas. MEDICATION  If pain medicine is needed, nonsteroidal anti-inflammatory medicines (such as aspirin or ibuprofen) or other minor pain relievers (such as acetaminophen) are often advised.   Do not take pain medicine for 7 days before surgery.   Stronger pain relievers may be prescribed. Use only as directed and take only as much as you need.  COLD THERAPY Cold treatment (icing) relieves  pain and reduces inflammation. Cold treatment should be applied for 10 to 15 minutes every 2 to 3 hours, and immediately after activity that aggravates your symptoms. Use ice packs or an ice massage. SEEK IMMEDIATE MEDICAL CARE IF:  You have severe shoulder pain unrelieved by rest and taking pain medicine.   You have pain, numbness, tingling, or weakness in the hand or wrist.   You have shortness of breath, chest pain, severe weakness, or fainting.   You have severe pain with motion of the fingers or wrist.   Blue, gray, or dark color appears in the fingernails on injured extremity.  Document Released: 11/09/2005 Document Revised: 10/29/2011 Document Reviewed: 02/21/2009 [facility] Patient Information 2012 [place], [place].      Home Safety and Preventing Falls Falls are a leading cause of injury and while they affect all age groups, falls have greater short-term and long-term impact on older age groups. However, falls should not be a part of life or aging. It is possible for individuals and their families to use preventive measures to significantly decrease the likelihood that anyone, especially an older adult, will fall. There are many simple measures which can make your home safer with respect to preventing falls. The following actions can help reduce falls among all members of your family and are especially important as you age, when your balance, lower limb strength, coordination, and eyesight may be declining. The use of preventive measures will help to reduce you and your family's risk of falls and serious medical consequences. OUTDOORS  Repair cracks and edges of walkways and driveways.   Remove high doorway thresholds and trim shrubbery on the main path into your home.   Ensure there is good outside lighting at main entrances and along main walkways.   Clear walkways of tools, rocks, debris, and clutter.   Check that handrails are not broken and are securely fastened. Both sides  of steps should have handrails.   In the garage, be attentive to and clean up grease or oil spills on the cement. This can make the surface extremely slippery.   In winter, have leaves, snow, and ice cleared regularly.   Use sand or salt on walkways during winter months.  BATHROOM  Install grab bars by the toilet and in the tub and shower.   Use non-skid mats or decals in the tub or shower.   If unable to easily stand unsupported while showering, place a plastic non slip stool in the shower to sit on when needed.   Install night lights.   Keep floors dry and clean up all water on the floor immediately.   Remove soap buildup in tub or shower on a regular basis.   Secure bath mats with non-slip, double-sided rug tape.   Remove tripping hazards from the floors.  BEDROOMS  Install night lights.   Do not use oversized bedding.   Make sure a bedside light is easy to reach.   Keep a telephone by your bedside.   Make sure that you can  get in and out of your bed easily.   Have a firm chair, with side arms, to use for getting dressed.   Remove clutter from around closets.   Store clothing, bed coverings, and other household items where you can reach them comfortably.   Remove tripping hazards from the floor.  LIVING AREAS AND STAIRWAYS  Turn on lights to avoid having to walk through dark areas.   Keep lighting uniform in each room. Place brighter lightbulbs in darker areas, including stairways.   Replace lightbulbs that burn out in stairways immediately.   Arrange furniture to provide for clear pathways.   Keep furniture in the same place.   Eliminate or tape down electrical cables in high traffic areas.   Place handrails on both sides of stairways. Use handrails when going up or down stairs.   Most falls occur on the top or bottom 3 steps.   Fix any loose handrails. Make sure handrails on both sides of the stairways are as long as the stairs.   Remove all walkway  obstacles.   Coil or tape electrical cords off to the side of walking areas and out of the way. If using many extension cords, have an electrician put in a new wall outlet to reduce or eliminate them.   Make sure spills are cleaned up quickly and allow time for drying before walking on freshly cleaned floors.   Firmly attach carpet with non-skid or two-sided tape.   Keep frequently used items within easy reach.   Remove tripping hazards such as throw rugs and clutter in walkways. Never leave objects on stairs.   Get rid of throw rugs elsewhere if possible.   Eliminate uneven floor surfaces.   Make sure couches and chairs are easy to get into and out of.   Check carpeting to make sure it is firmly attached along stairs.   Make repairs to worn or loose carpet promptly.   Select a carpet pattern that does not visually hide the edge of steps.   Avoid placing throw rugs or scatter rugs at the top or bottom of stairways, or properly secure with carpet tape to prevent slippage.   Have an electrician put in a light switch at the top and bottom of the stairs.   Get light switches that glow.   Avoid the following practices: hurrying, inattention, obscured vision, carrying large loads, and wearing slip-on shoes.   Be aware of all pets.  KITCHEN  Place items that are used frequently, such as dishes and food, within easy reach.   Keep handles on pots and pans toward the center of the stove. Use back burners when possible.   Make sure spills are cleaned up quickly and allow time for drying.   Avoid walking on wet floors.   Avoid hot utensils and knives.   Position shelves so they are not too high or low.   Place commonly used objects within easy reach.   If necessary, use a sturdy step stool with a grab bar when reaching.   Make sure electrical cables are out of the way.   Do not use floor polish or wax that makes floors slippery.  OTHER HOME FALL PREVENTION STRATEGIES  Wear  low heel or rubber sole shoes that are supportive and fit well.   Wear closed toe shoes.   Know and watch for side effects of medications. Have your caregiver or pharmacist look at all your medicines, even over-the-counter medicines. Some medicines can make you sleepy  or dizzy.   Exercise regularly. Exercise makes you stronger and improves your balance and coordination.   Limit use of alcohol.   Use eyeglasses if necessary and keep them clean. Have your vision checked every year.   Organize your household in a manner that minimizes the need to walk distances when hurried, or go up and down stairs unnecessarily. For example, have a phone placed on at least each floor of your home. If possible, have a phone beside each sitting or lying area where you spend the most time at home. Keep emergency numbers posted at all phones.   Use non-skid floor wax.   When using a ladder, make sure:   The base is firm.   All ladder feet are on level ground.   The ladder is angled against the wall properly.   When climbing a ladder, face the ladder and hold the ladder rungs firmly.   If reaching, always keep your hips and body weight centered between the rails.   When using a stepladder, make sure it is fully opened and both spreaders are firmly locked.   Do not climb a closed stepladder.   Avoid climbing beyond the second step from the top of a stepladder and the 4th rung from the top of an extension ladder.   Learn and use mobility aids as needed.   Change positions slowly. Arise slowly from sitting and lying positions. Sit on the edge of your bed before getting to your feet.   If you have a history of falls, ask someone to add color or contrast paint or tape to grab bars and handrails in your home.   If you have a history of falls, ask someone to place contrasting color strips on first and last steps.   Install an electrical emergency response system if you need one, and know how to use it.    If you have a medical or other condition that causes you to have limited physical strength, it is important that you reach out to family and friends for occasional help.  FOR CHILDREN:  If young children are in the home, use safety gates. At the top of stairs use screw-mounted gates; use pressure-mounted gates for the bottom of the stairs and doorways between rooms.   Young children should be taught to descend stairs on their stomachs, feet first, and later using the handrail.   Keep drawers fully closed to prevent them from being climbed on or pulled out entirely.   Move chairs, cribs, beds and other furniture away from windows.   Consider installing window guards on windows ground floor and up, unless they are emergency fire exits. Make sure they have easy release mechanisms.   Consider installing special locks that only allow the window to be opened to a certain height.   Never rely on window screens to prevent falls.   Never leave babies alone on changing tables, beds or sofas. Use a changing table that has a restraining strap.   When a child can pull to a standing position, the crib mattress should be adjusted to its lowest position. There should be at least 26 inches between the top rails of the crib drop side and the mattress. Toys, bumper pads, and other objects that can be used as steps to climb out should be removed from the crib.   On bunk beds never allow a child under age 70 to sleep on the top bunk. For older children, if the upper bunk is not  against a wall, use guard rails on both sides. No matter how old a child is, keep the guard rails in place on the top bunk since children roll during sleep. Do not permit horseplay on bunks.   Grass and soil surfaces beneath backyard playground equipment should be replaced with hardwood chips, shredded wood mulch, sand, pea gravel, rubber, crushed stone, or another safer material at depths of at least 9 to 12 inches.   When riding  bikes or using skates, skateboards, skis, or snowboards, require children to wear helmets. Look for those that have stickers stating that they meet or exceed safety standards.   Vertical posts or pickets in deck, balcony, and stairway railings should be no more than 3 1/2 inches apart if a young baby will have access to the area. The space between horizontal rails or bars, and between the floor and the first horizontal rail or bar, should be no more than 3 1/2 inches.  Document Released: 10/30/2002 Document Revised: 10/29/2011 Document Reviewed: 08/29/2009 [facility] Patient Information 2012 [place], [place].      Cryotherapy Cryotherapy means treatment with cold. Ice or gel packs can be used to reduce both pain and swelling. Ice is the most helpful within the first 24 to 48 hours after an injury or flareup from overusing a muscle or joint. Sprains, strains, spasms, burning pain, shooting pain, and aches can all be eased with ice. Ice can also be used when recovering from surgery. Ice is effective, has very few side effects, and is safe for most people to use. PRECAUTIONS  Ice is not a safe treatment option for people with:  Raynaud's phenomenon. This is a condition affecting small blood vessels in the extremities. Exposure to cold may cause your problems to return.   Cold hypersensitivity. There are many forms of cold hypersensitivity, including:   Cold urticaria. Red, itchy hives appear on the skin when the tissues begin to warm after being iced.   Cold erythema. This is a red, itchy rash caused by exposure to cold.   Cold hemoglobinuria. Red blood cells break down when the tissues begin to warm after being iced. The hemoglobin that carry oxygen are passed into the urine because they cannot combine with blood proteins fast enough.   Numbness or altered sensitivity in the area being iced.  If you have any of the following conditions, do not use ice until you have discussed cryotherapy with  your caregiver:  Heart conditions, such as arrhythmia, angina, or chronic heart disease.   High blood pressure.   Healing wounds or open skin in the area being iced.   Current infections.   Rheumatoid arthritis.   Poor circulation.   Diabetes.  Ice slows the blood flow in the region it is applied. This is beneficial when trying to stop inflamed tissues from spreading irritating chemicals to surrounding tissues. However, if you expose your skin to cold temperatures for too long or without the proper protection, you can damage your skin or nerves. Watch for signs of skin damage due to cold. HOME CARE INSTRUCTIONS Follow these tips to use ice and cold packs safely.  Place a dry or damp towel between the ice and skin. A damp towel will cool the skin more quickly, so you may need to shorten the time that the ice is used.   For a more rapid response, add gentle compression to the ice.   Ice for no more than 10 to 20 minutes at a time. The bonier the  area you are icing, the less time it will take to get the benefits of ice.   Check your skin after 5 minutes to make sure there are no signs of a poor response to cold or skin damage.   Rest 20 minutes or more in between uses.   Once your skin is numb, you can end your treatment. You can test numbness by very lightly touching your skin. The touch should be so light that you do not see the skin dimple from the pressure of your fingertip. When using ice, most people will feel these normal sensations in this order: cold, burning, aching, and numbness.   Do not use ice on someone who cannot communicate their responses to pain, such as small children or people with dementia.  HOW TO MAKE AN ICE PACK Ice packs are the most common way to use ice therapy. Other methods include ice massage, ice baths, and cryo-sprays. Muscle creams that cause a cold, tingly feeling do not offer the same benefits that ice offers and should not be used as a substitute  unless recommended by your caregiver. To make an ice pack, do one of the following:  Place crushed ice or a bag of frozen vegetables in a sealable plastic bag. Squeeze out the excess air. Place this bag inside another plastic bag. Slide the bag into a pillowcase or place a damp towel between your skin and the bag.   Mix 3 parts water with 1 part rubbing alcohol. Freeze the mixture in a sealable plastic bag. When you remove the mixture from the freezer, it will be slushy. Squeeze out the excess air. Place this bag inside another plastic bag. Slide the bag into a pillowcase or place a damp towel between your skin and the bag.  SEEK MEDICAL CARE IF:  You develop white spots on your skin. This may give the skin a blotchy (mottled) appearance.   Your skin turns blue or pale.   Your skin becomes waxy or hard.   Your swelling gets worse.  MAKE SURE YOU:   Understand these instructions.   Will watch your condition.   Will get help right away if you are not doing well or get worse.  Document Released: 07/06/2011 Document Revised: 10/29/2011 Document Reviewed: 07/06/2011 [facility] Patient Information 2012 [place], [place].

## 2012-03-28 ENCOUNTER — Other Ambulatory Visit (HOSPITAL_BASED_OUTPATIENT_CLINIC_OR_DEPARTMENT_OTHER): Payer: Medicare Other | Admitting: Lab

## 2012-03-28 DIAGNOSIS — C9110 Chronic lymphocytic leukemia of B-cell type not having achieved remission: Secondary | ICD-10-CM

## 2012-03-28 LAB — CBC & DIFF AND RETIC
Basophils Absolute: 0 10*3/uL (ref 0.0–0.1)
HCT: 31 % — ABNORMAL LOW (ref 34.8–46.6)
HGB: 10.5 g/dL — ABNORMAL LOW (ref 11.6–15.9)
Immature Retic Fract: 10.6 % — ABNORMAL HIGH (ref 1.60–10.00)
MONO#: 1.1 10*3/uL — ABNORMAL HIGH (ref 0.1–0.9)
NEUT#: 7.3 10*3/uL — ABNORMAL HIGH (ref 1.5–6.5)
NEUT%: 52.3 % (ref 38.4–76.8)
Retic Ct Abs: 72.03 10*3/uL (ref 33.70–90.70)
WBC: 14 10*3/uL — ABNORMAL HIGH (ref 3.9–10.3)
lymph#: 5.4 10*3/uL — ABNORMAL HIGH (ref 0.9–3.3)

## 2012-03-28 LAB — MORPHOLOGY: PLT EST: ADEQUATE

## 2012-03-28 LAB — CHCC SMEAR

## 2012-03-29 LAB — COMPREHENSIVE METABOLIC PANEL
ALT: 12 U/L (ref 0–35)
AST: 16 U/L (ref 0–37)
BUN: 58 mg/dL — ABNORMAL HIGH (ref 6–23)
CO2: 22 mEq/L (ref 19–32)
Creatinine, Ser: 1.96 mg/dL — ABNORMAL HIGH (ref 0.50–1.10)
Total Bilirubin: 0.4 mg/dL (ref 0.3–1.2)

## 2012-03-29 LAB — LACTATE DEHYDROGENASE: LDH: 219 U/L (ref 94–250)

## 2012-04-04 ENCOUNTER — Telehealth: Payer: Self-pay | Admitting: *Deleted

## 2012-04-04 ENCOUNTER — Ambulatory Visit (HOSPITAL_BASED_OUTPATIENT_CLINIC_OR_DEPARTMENT_OTHER): Payer: Medicare Other | Admitting: Oncology

## 2012-04-04 VITALS — BP 165/63 | HR 103 | Temp 98.1°F | Ht <= 58 in | Wt 147.8 lb

## 2012-04-04 DIAGNOSIS — R5381 Other malaise: Secondary | ICD-10-CM

## 2012-04-04 DIAGNOSIS — N289 Disorder of kidney and ureter, unspecified: Secondary | ICD-10-CM

## 2012-04-04 DIAGNOSIS — R5383 Other fatigue: Secondary | ICD-10-CM

## 2012-04-04 DIAGNOSIS — C9110 Chronic lymphocytic leukemia of B-cell type not having achieved remission: Secondary | ICD-10-CM

## 2012-04-04 DIAGNOSIS — D649 Anemia, unspecified: Secondary | ICD-10-CM

## 2012-04-04 DIAGNOSIS — D539 Nutritional anemia, unspecified: Secondary | ICD-10-CM

## 2012-04-04 NOTE — Progress Notes (Signed)
ID: [name]   DOB: [birth year]  MR#: [id number]  CSN#:[id number]   INTERVAL HISTORY: The patient returns today with her husband [name] for followup of her chronic lymphoid leukemia. The interval history is significant for [name] having had a TIA. [name] herself  fell and fractured her right shoulder. She saw Dr. Ranell Patrick for this, and he placed her in a sling, which she is wearing today.  REVIEW OF SYSTEMS: She has had no fevers, drenching sweats, unexplained weight loss, rash, bleeding, pruritus, or adenopathy. She has some pain related to the right shoulder fracture. Otherwise a detailed review of systems today was noncontributory PAST MEDICAL HISTORY: Past Medical History  Diagnosis Date  . Diabetes mellitus   . Hyperlipidemia   . Hypertension   . Carotid stenosis, right 08/2005    40-60%  . History of colonic polyps 2002    Normal colonoscopy 08/2004  . CKD (chronic kidney disease) stage 2, GFR 60-89 ml/min   . CLL (chronic lymphocytic leukemia)     Dr. Darnelle Catalan. s/p Rituxan  . Pulse irregularity     Etiology->frequent PACs, normal EKG    FAMILY HISTORY No family history on file.  GYNECOLOGIC HISTORY: GX P0  SOCIAL HISTORY: She is originally from [place]. 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 Her husband [name] worked as a IT trainer. They have no children. They are Catholic.  ADVANCED DIRECTIVES: in place  HEALTH MAINTENANCE: History  Substance Use Topics  . Smoking status: Former Games developer  . Smokeless tobacco: Not on file  . Alcohol Use: Not on file     Colonoscopy: Buccini  PAP:  Bone density:  Lipid panel:  No Known Allergies  Current Outpatient Prescriptions  Medication Sig Dispense Refill  . acetaminophen (TYLENOL) 500 MG tablet Take 1,000 mg by mouth 2 (two) times daily as needed. For pain      . amLODipine (NORVASC) 10 MG tablet Take 1 tablet (10 mg total) by mouth daily.  100 tablet  3  . aspirin EC 81 MG tablet Take 81 mg by mouth daily.      . Calcium Carbonate-Vitamin D  (CALCIUM PLUS VITAMIN D PO) Take 1 tablet by mouth 2 (two) times daily.      . Cranberry 450 MG CAPS Take 1 capsule by mouth daily.      [name] dipyridamole-aspirin (AGGRENOX) 25-200 MG per 12 hr capsule Take 1 capsule by mouth 2 (two) times daily.  200 capsule  3  . furosemide (LASIX) 40 MG tablet Take 1 tablet (40 mg total) by mouth 2 (two) times daily.  100 tablet  3  . GNP GARLIC EXTRACT PO Take 1 tablet by mouth daily.      [name] losartan (COZAAR) 100 MG tablet Take 100 mg by mouth daily.        . Multiple Vitamins-Minerals (MACULAR VITAMIN BENEFIT PO) Take 1 tablet by mouth 2 (two) times daily.      . Multiple Vitamins-Minerals (MULTIVITAMINS THER. W/MINERALS) TABS Take 1 tablet by mouth daily.      . Omega-3 Fatty Acids (FISH OIL) 1200 MG CAPS Take 1 capsule by mouth daily.      [name] omeprazole (PRILOSEC) 20 MG capsule Take 1 capsule (20 mg total) by mouth daily.  100 capsule  3  . Polyethyl Glycol-Propyl Glycol (SYSTANE OP) Place 1 drop into both eyes 2 (two) times daily.      . riTUXimab (RITUXAN) 10 MG/ML injection Administered by Dr. Darnelle Catalan       . simvastatin (ZOCOR)  20 MG tablet Take 1 tablet (20 mg total) by mouth at bedtime.  100 tablet  3  . tramadol-acetaminophen (ULTRACET) 37.5-325 MG per tablet Take 1-2 tablets by mouth every 6 (six) hours as needed.          OBJECTIVE: Elderly white woman wearing a right arm sling Filed Vitals:   04/04/12 1350  BP: 165/63  Pulse: 103  Temp: 98.1 F (36.7 C)     Body mass index is 30.89 kg/(m^2).    ECOG FS: 2  Sclerae unicteric Oropharynx clear No cervical, supraclavicular, axillary or inguinal adenopathy Lungs no rales or rhonchi Heart regular rate and rhythm Abd benign, no splenomegaly noted MSK no focal spinal tenderness, no peripheral edema Neuro: nonfocal   LAB RESULTS: Lab Results  Component Value Date   WBC 14.0* 03/28/2012   NEUTROABS 7.3* 03/28/2012   HGB 10.5* 03/28/2012   HCT 31.0* 03/28/2012   MCV 90.4 03/28/2012   PLT 404*  03/28/2012      Chemistry      Component Value Date/Time   NA 139 03/28/2012 1357   K 4.5 03/28/2012 1357   CL 102 03/28/2012 1357   CO2 22 03/28/2012 1357   BUN 58* 03/28/2012 1357   CREATININE 1.96* 03/28/2012 1357   CREATININE 1.93* 11/06/2011 1111      Component Value Date/Time   CALCIUM 9.7 03/28/2012 1357   CALCIUM 10.5 08/07/2008 0000   ALKPHOS 65 03/28/2012 1357   AST 16 03/28/2012 1357   ALT 12 03/28/2012 1357   BILITOT 0.4 03/28/2012 1357       No results found for this basename: LABCA2    No components found with this basename: [id number]    No results found for this basename: INR:1;PROTIME:1 in the last 168 hours  Urinalysis No results found for this basename: colorurine, appearanceur, labspec, phurine, glucoseu, hgbur, bilirubinur, ketonesur, proteinur, urobilinogen, nitrite, leukocytesur    STUDIES: Dg Shoulder Right  03/20/2012  *RADIOLOGY REPORT*  Clinical Data: Right arm pain and bruising following a fall yesterday.  RIGHT SHOULDER - 2+ VIEW  Comparison: Chest dated 03/28/2009.  Findings: Interval comminuted, impacted fracture of the right humeral head and neck.  Mild anterior and medial displacement of the distal fragment as well as mild posterior and lateral angulation of the distal fragment.  Extensive rotator cuff calcifications.  Moderate superior acromioclavicular spur formation.  IMPRESSION:  1.  Comminuted, impacted fracture of the right humeral head and neck, as described above. 2.  Extensive rotator cuff calcific tendonitis.  Original Report Authenticated By: Darrol Angel, M.D.    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  Status post treatment with Rituxan in 2004 and 2007, with no treatment after November 2007.        PLAN: She is more anemic than previously. This could be due to the chronic lymphoid leukemia. However the platelets are if anything  up, and the white cells are not markedly up. The beta-2 microglobulin is increased, but that could well be due out to her worsening renal function. At this point I don't know if her anemia is due to lack of each bowel, or the lymphoma/leukemia, or other.  I have encouraged her to drink more than a quart of liquid a day. She will return in 6 weeks for lab work only and then the second week in July to see me. At that point we will decide whether she will need  erythropoietin supplementation or another cycle of rituximab   Kinslie Hove C    04/04/2012

## 2012-04-04 NOTE — Telephone Encounter (Signed)
gave patient appointment for 05-31-2012 at 3:00pm with the lab one week before the md appointments

## 2012-05-17 ENCOUNTER — Other Ambulatory Visit: Payer: Self-pay | Admitting: *Deleted

## 2012-05-17 ENCOUNTER — Other Ambulatory Visit (HOSPITAL_BASED_OUTPATIENT_CLINIC_OR_DEPARTMENT_OTHER): Payer: Medicare Other | Admitting: Lab

## 2012-05-17 DIAGNOSIS — C8599 Non-Hodgkin lymphoma, unspecified, extranodal and solid organ sites: Secondary | ICD-10-CM

## 2012-05-17 DIAGNOSIS — D539 Nutritional anemia, unspecified: Secondary | ICD-10-CM

## 2012-05-17 DIAGNOSIS — D649 Anemia, unspecified: Secondary | ICD-10-CM

## 2012-05-17 DIAGNOSIS — I749 Embolism and thrombosis of unspecified artery: Secondary | ICD-10-CM

## 2012-05-17 DIAGNOSIS — C9110 Chronic lymphocytic leukemia of B-cell type not having achieved remission: Secondary | ICD-10-CM

## 2012-05-17 DIAGNOSIS — R5383 Other fatigue: Secondary | ICD-10-CM

## 2012-05-17 LAB — CBC & DIFF AND RETIC
Basophils Absolute: 0.1 10*3/uL (ref 0.0–0.1)
EOS%: 1.9 % (ref 0.0–7.0)
Eosinophils Absolute: 0.3 10*3/uL (ref 0.0–0.5)
HCT: 31.1 % — ABNORMAL LOW (ref 34.8–46.6)
HGB: 10.5 g/dL — ABNORMAL LOW (ref 11.6–15.9)
LYMPH%: 47.3 % (ref 14.0–49.7)
MCH: 30.5 pg (ref 25.1–34.0)
MCV: 90.4 fL (ref 79.5–101.0)
MONO%: 10.2 % (ref 0.0–14.0)
NEUT#: 6.6 10*3/uL — ABNORMAL HIGH (ref 1.5–6.5)
NEUT%: 40.3 % (ref 38.4–76.8)
Platelets: 333 10*3/uL (ref 145–400)
RDW: 14 % (ref 11.2–14.5)
Retic Ct Abs: 45.41 10*3/uL (ref 33.70–90.70)

## 2012-05-17 LAB — COMPREHENSIVE METABOLIC PANEL
AST: 17 U/L (ref 0–37)
Albumin: 3.9 g/dL (ref 3.5–5.2)
Alkaline Phosphatase: 86 U/L (ref 39–117)
BUN: 42 mg/dL — ABNORMAL HIGH (ref 6–23)
Calcium: 9.7 mg/dL (ref 8.4–10.5)
Chloride: 100 mEq/L (ref 96–112)
Potassium: 4.4 mEq/L (ref 3.5–5.3)
Sodium: 137 mEq/L (ref 135–145)
Total Protein: 7.2 g/dL (ref 6.0–8.3)

## 2012-05-18 LAB — ERYTHROPOIETIN: Erythropoietin: 20.2 m[IU]/mL (ref 2.6–34.0)

## 2012-05-19 ENCOUNTER — Encounter: Payer: Self-pay | Admitting: *Deleted

## 2012-05-19 DIAGNOSIS — R269 Unspecified abnormalities of gait and mobility: Secondary | ICD-10-CM | POA: Insufficient documentation

## 2012-05-19 DIAGNOSIS — D638 Anemia in other chronic diseases classified elsewhere: Secondary | ICD-10-CM | POA: Insufficient documentation

## 2012-05-19 DIAGNOSIS — R279 Unspecified lack of coordination: Secondary | ICD-10-CM

## 2012-05-31 ENCOUNTER — Ambulatory Visit (HOSPITAL_BASED_OUTPATIENT_CLINIC_OR_DEPARTMENT_OTHER): Payer: Medicare Other | Admitting: Oncology

## 2012-05-31 ENCOUNTER — Ambulatory Visit: Payer: Medicare Other

## 2012-05-31 ENCOUNTER — Telehealth: Payer: Self-pay | Admitting: Oncology

## 2012-05-31 VITALS — BP 154/63 | HR 90 | Temp 98.6°F | Ht <= 58 in | Wt 144.7 lb

## 2012-05-31 DIAGNOSIS — C9110 Chronic lymphocytic leukemia of B-cell type not having achieved remission: Secondary | ICD-10-CM

## 2012-05-31 DIAGNOSIS — D649 Anemia, unspecified: Secondary | ICD-10-CM

## 2012-05-31 NOTE — Telephone Encounter (Signed)
gve the pt her aug-July 2014 appt calendar

## 2012-05-31 NOTE — Progress Notes (Signed)
ID: [name]   DOB: [birth year]  MR#: [id number]  CSN#:[id number]   INTERVAL HISTORY: [name] returns today with her husband [name] for followup of her chronic lymphoid leukemia. The interval history is significant for [name] feeling very depressed. He tells me that he goes back in his life and C7 good things about things and all of this is somehow getting him significantly down. I suggested he keep a diary and right things down and it turns out he a ready wrote a 700 page hope with all his experiences.  REVIEW OF SYSTEMS: [name] herself is doing quite well. She is still having some discomfort in her right shoulder, but this is not new, or more intense or frequent. She has had no drenching sweats no fevers no shortness of breath, no cough, no phlegm production, no palpitations, no change in bowel or bladder habits, with some chronic urinary leakage which she does not find very troublesome. She does all her housework cooking and laundry, while [name] does all the driving. She does not describe herself as unusually fatigued. A detailed review of systems is otherwise noncontributory  PAST MEDICAL HISTORY: Past Medical History  Diagnosis Date  . Diabetes mellitus   . Hyperlipidemia   . Hypertension   . Carotid stenosis, right 08/2005    40-60%  . History of colonic polyps 2002    Normal colonoscopy 08/2004  . CKD (chronic kidney disease) stage 2, GFR 60-89 ml/min   . CLL (chronic lymphocytic leukemia)     Dr. Darnelle Hampton. s/p Rituxan  . Pulse irregularity     Etiology->frequent PACs, normal EKG    FAMILY HISTORY No family history on file.  GYNECOLOGIC HISTORY: GX P0  SOCIAL HISTORY: She is originally from [place]. 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 They have no children. They are Catholic.  ADVANCED DIRECTIVES: in place  HEALTH MAINTENANCE: History  Substance Use Topics  . Smoking status: Former Games developer  . Smokeless tobacco: Not on file  . Alcohol Use: Not on file      Colonoscopy: Buccini  PAP:  Bone density:  Lipid panel:  No Known Allergies  Current Outpatient Prescriptions  Medication Sig Dispense Refill  . acetaminophen (TYLENOL) 500 MG tablet Take 1,000 mg by mouth 2 (two) times daily as needed. For pain      . amLODipine (NORVASC) 10 MG tablet Take 1 tablet (10 mg total) by mouth daily.  100 tablet  3  . aspirin EC 81 MG tablet Take 81 mg by mouth daily.      . Calcium Carbonate-Vitamin D (CALCIUM PLUS VITAMIN D PO) Take 1 tablet by mouth 2 (two) times daily.      . Cranberry 450 MG CAPS Take 1 capsule by mouth daily.      [name] dipyridamole-aspirin (AGGRENOX) 25-200 MG per 12 hr capsule Take 1 capsule by mouth 2 (two) times daily.  200 capsule  3  . furosemide (LASIX) 40 MG tablet Take 1 tablet (40 mg total) by mouth 2 (two) times daily.  100 tablet  3  . GNP GARLIC EXTRACT PO Take 1 tablet by mouth daily.      [name] losartan (COZAAR) 100 MG tablet Take 100 mg by mouth daily.        . Multiple Vitamins-Minerals (MACULAR VITAMIN BENEFIT PO) Take 1 tablet by mouth 2 (two) times daily.      . Multiple Vitamins-Minerals (MULTIVITAMINS THER. W/MINERALS) TABS Take 1 tablet by mouth daily.      [name]  Omega-3 Fatty Acids (FISH OIL) 1200 MG CAPS Take 1 capsule by mouth daily.      [name] omeprazole (PRILOSEC) 20 MG capsule Take 1 capsule (20 mg total) by mouth daily.  100 capsule  3  . Polyethyl Glycol-Propyl Glycol (SYSTANE OP) Place 1 drop into both eyes 2 (two) times daily.      . riTUXimab (RITUXAN) 10 MG/ML injection Administered by Dr. Darnelle Hampton       . simvastatin (ZOCOR) 20 MG tablet Take 1 tablet (20 mg total) by mouth at bedtime.  100 tablet  3  . tramadol-acetaminophen (ULTRACET) 37.5-325 MG per tablet Take 1-2 tablets by mouth every 6 (six) hours as needed.          OBJECTIVE: Elderly white woman in no acute distress Filed Vitals:   05/31/12 1453  BP: 154/63  Pulse: 90  Temp: 98.6 F (37 C)     Body mass index is 30.24 kg/(m^2).    ECOG FS:  2  Sclerae unicteric Oropharynx clear No cervical, supraclavicular, or axillary adenopathy Lungs no rales or rhonchi Heart regular rate and rhythm Abd benign, no splenomegaly noted MSK no focal spinal tenderness, no peripheral edema; fair range of motion bilateral upper extremities Neuro: nonfocal   LAB RESULTS: Lab Results  Component Value Date   WBC 16.5* 05/17/2012   NEUTROABS 6.6* 05/17/2012   HGB 10.5* 05/17/2012   HCT 31.1* 05/17/2012   MCV 90.4 05/17/2012   PLT 333 05/17/2012      Chemistry      Component Value Date/Time   NA 137 05/17/2012 1453   K 4.4 05/17/2012 1453   CL 100 05/17/2012 1453   CO2 23 05/17/2012 1453   BUN 42* 05/17/2012 1453   CREATININE 1.98* 05/17/2012 1453   CREATININE 1.93* 11/06/2011 1111      Component Value Date/Time   CALCIUM 9.7 05/17/2012 1453   CALCIUM 10.5 08/07/2008 0000   ALKPHOS 86 05/17/2012 1453   AST 17 05/17/2012 1453   ALT 11 05/17/2012 1453   BILITOT 0.3 05/17/2012 1453       No results found for this basename: LABCA2    No components found with this basename: [id number]    No results found for this basename: INR:1;PROTIME:1 in the last 168 hours  Urinalysis No results found for this basename: colorurine,  appearanceur,  labspec,  phurine,  glucoseu,  hgbur,  bilirubinur,  ketonesur,  proteinur,  urobilinogen,  nitrite,  leukocytesur    STUDIES: Dg Shoulder Right  03/20/2012  *RADIOLOGY REPORT*  Clinical Data: Right arm pain and bruising following a fall yesterday.  RIGHT SHOULDER - 2+ VIEW  Comparison: Chest dated 03/28/2009.  Findings: Interval comminuted, impacted fracture of the right humeral head and neck.  Mild anterior and medial displacement of the distal fragment as well as mild posterior and lateral angulation of the distal fragment.  Extensive rotator cuff calcifications.  Moderate superior acromioclavicular spur formation.  IMPRESSION:  1.  Comminuted, impacted fracture of the right humeral head and neck, as described  above. 2.  Extensive rotator cuff calcific tendonitis.  Original Report Authenticated By: Darrol Angel, M.D.    ASSESSMENT: 78 y.o.  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  Status post treatment with Rituxan in 2004 and 2007, with no treatment after November 2007.   (2) anemia, consistent with anemia of chronic renal injury  PLAN: Her hemoglobin is stable. She gives me no signs or symptoms suggestive of active leukemia/lymphoma, and her white cell count and platelets are stable. I think she is developing anemia of chronic renal failure, and may need erythropoietin at some point. Right now, she has absolutely no symptoms, and her hemoglobin is adequate.  What we're going to do is obtain lab work on a monthly basis. If her hemoglobin drifts of the low 9.5 or she begins to develop symptoms we will consider adding erythropoietin. Otherwise she will return to see me in one year. She knows to call for any problems that may develop before the next visit.  I am concerned that Sol looks so downcast. I have suggested they discuss this with his primary care physician. Mohogany Toppins C    05/31/2012

## 2012-06-03 ENCOUNTER — Ambulatory Visit (INDEPENDENT_AMBULATORY_CARE_PROVIDER_SITE_OTHER): Payer: Medicare Other | Admitting: Internal Medicine

## 2012-06-03 ENCOUNTER — Encounter: Payer: Self-pay | Admitting: Internal Medicine

## 2012-06-03 VITALS — BP 134/55 | HR 88 | Ht <= 58 in | Wt 144.6 lb

## 2012-06-03 DIAGNOSIS — E119 Type 2 diabetes mellitus without complications: Secondary | ICD-10-CM

## 2012-06-03 DIAGNOSIS — I10 Essential (primary) hypertension: Secondary | ICD-10-CM

## 2012-06-03 DIAGNOSIS — C9110 Chronic lymphocytic leukemia of B-cell type not having achieved remission: Secondary | ICD-10-CM

## 2012-06-03 LAB — GLUCOSE, CAPILLARY: Glucose-Capillary: 150 mg/dL — ABNORMAL HIGH (ref 70–99)

## 2012-06-03 LAB — POCT GLYCOSYLATED HEMOGLOBIN (HGB A1C): Hemoglobin A1C: 6.5

## 2012-06-03 IMAGING — CR DG SHOULDER 2+V*R*
3 series · 3 of 3 positions shown · non-contrast
Comparison: Chest dated 03/28/2009.

CLINICAL DATA: Right arm pain and bruising following a fall
yesterday.

RIGHT SHOULDER - 2+ VIEW

[w shoulder ap internal righ]
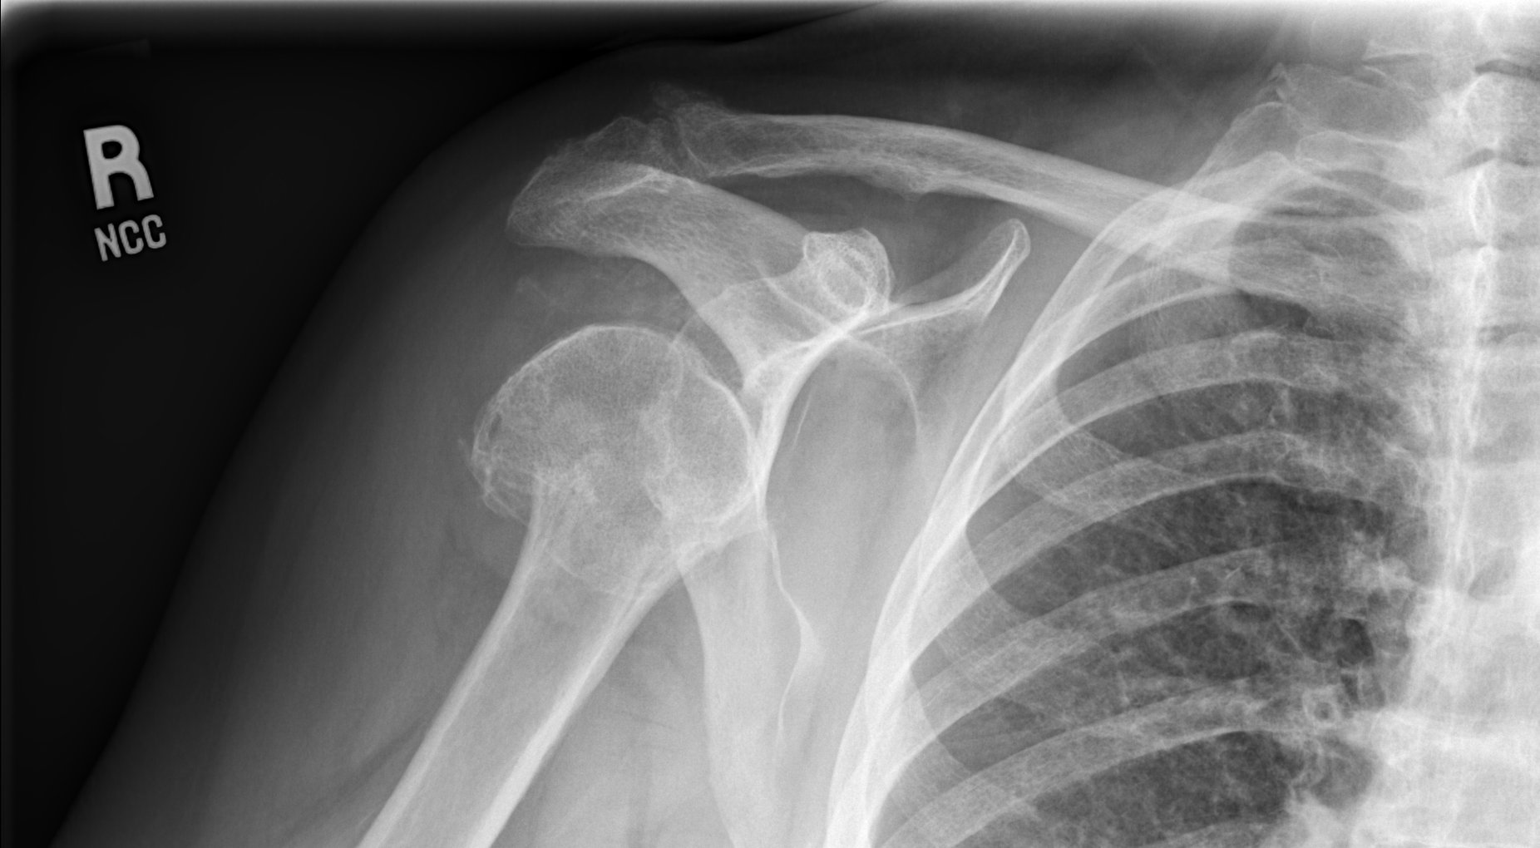

[w shoulder ap external righ]
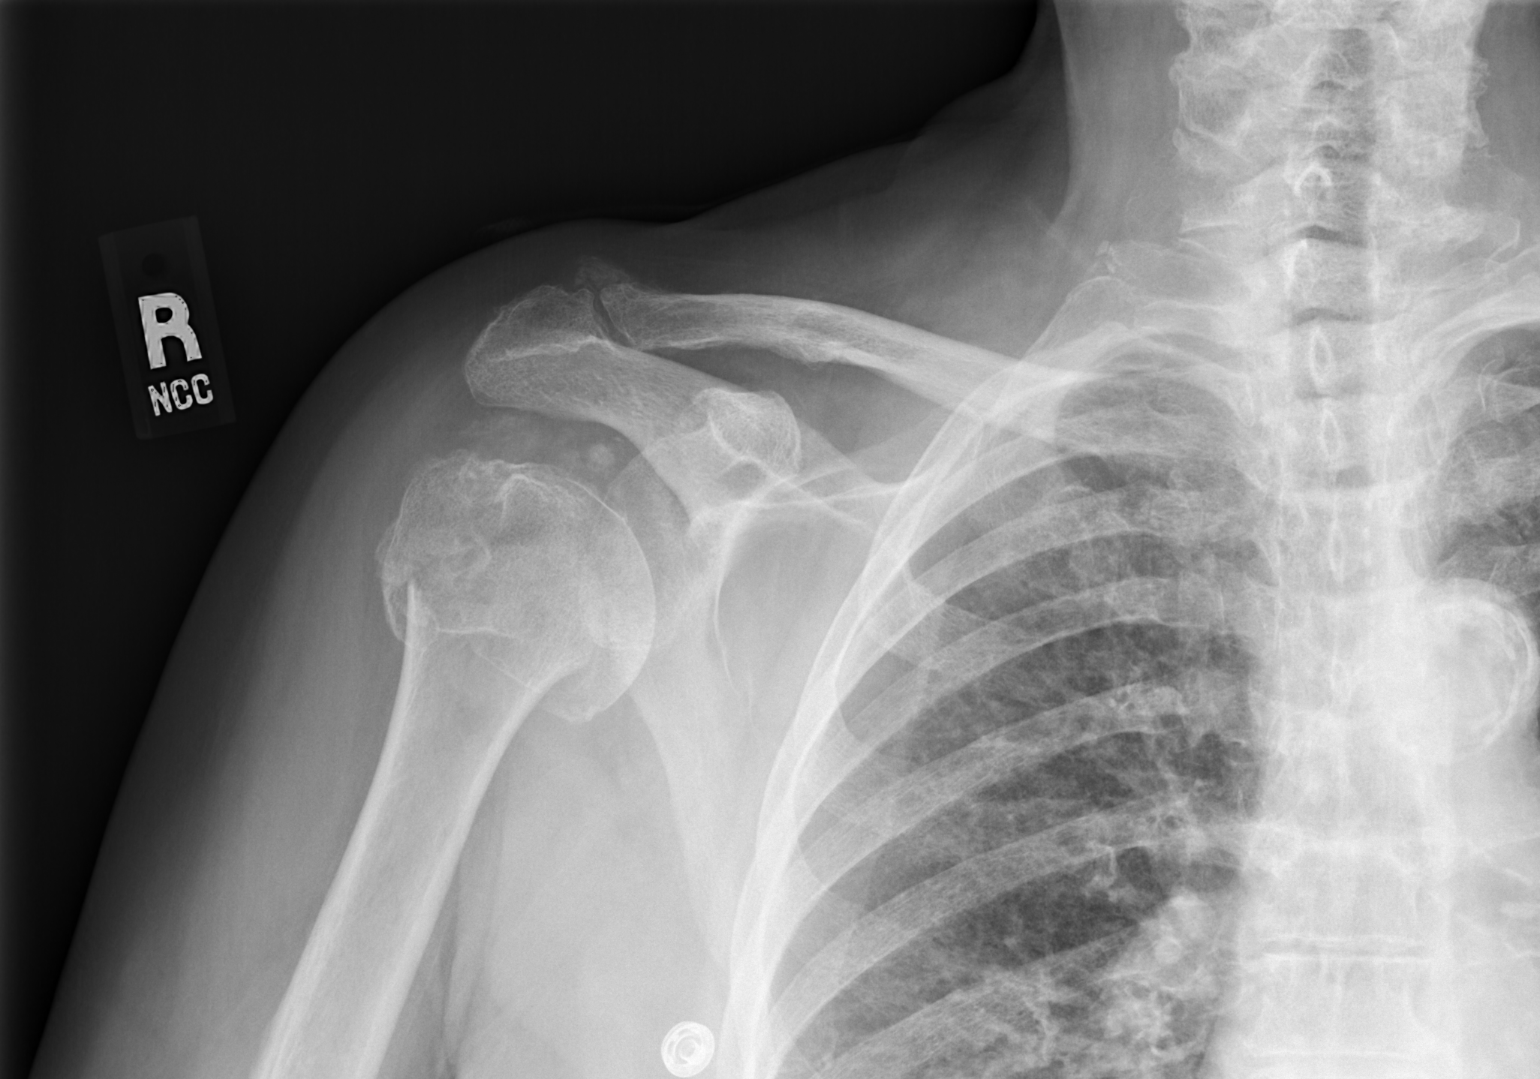

[w shoulder y view right]
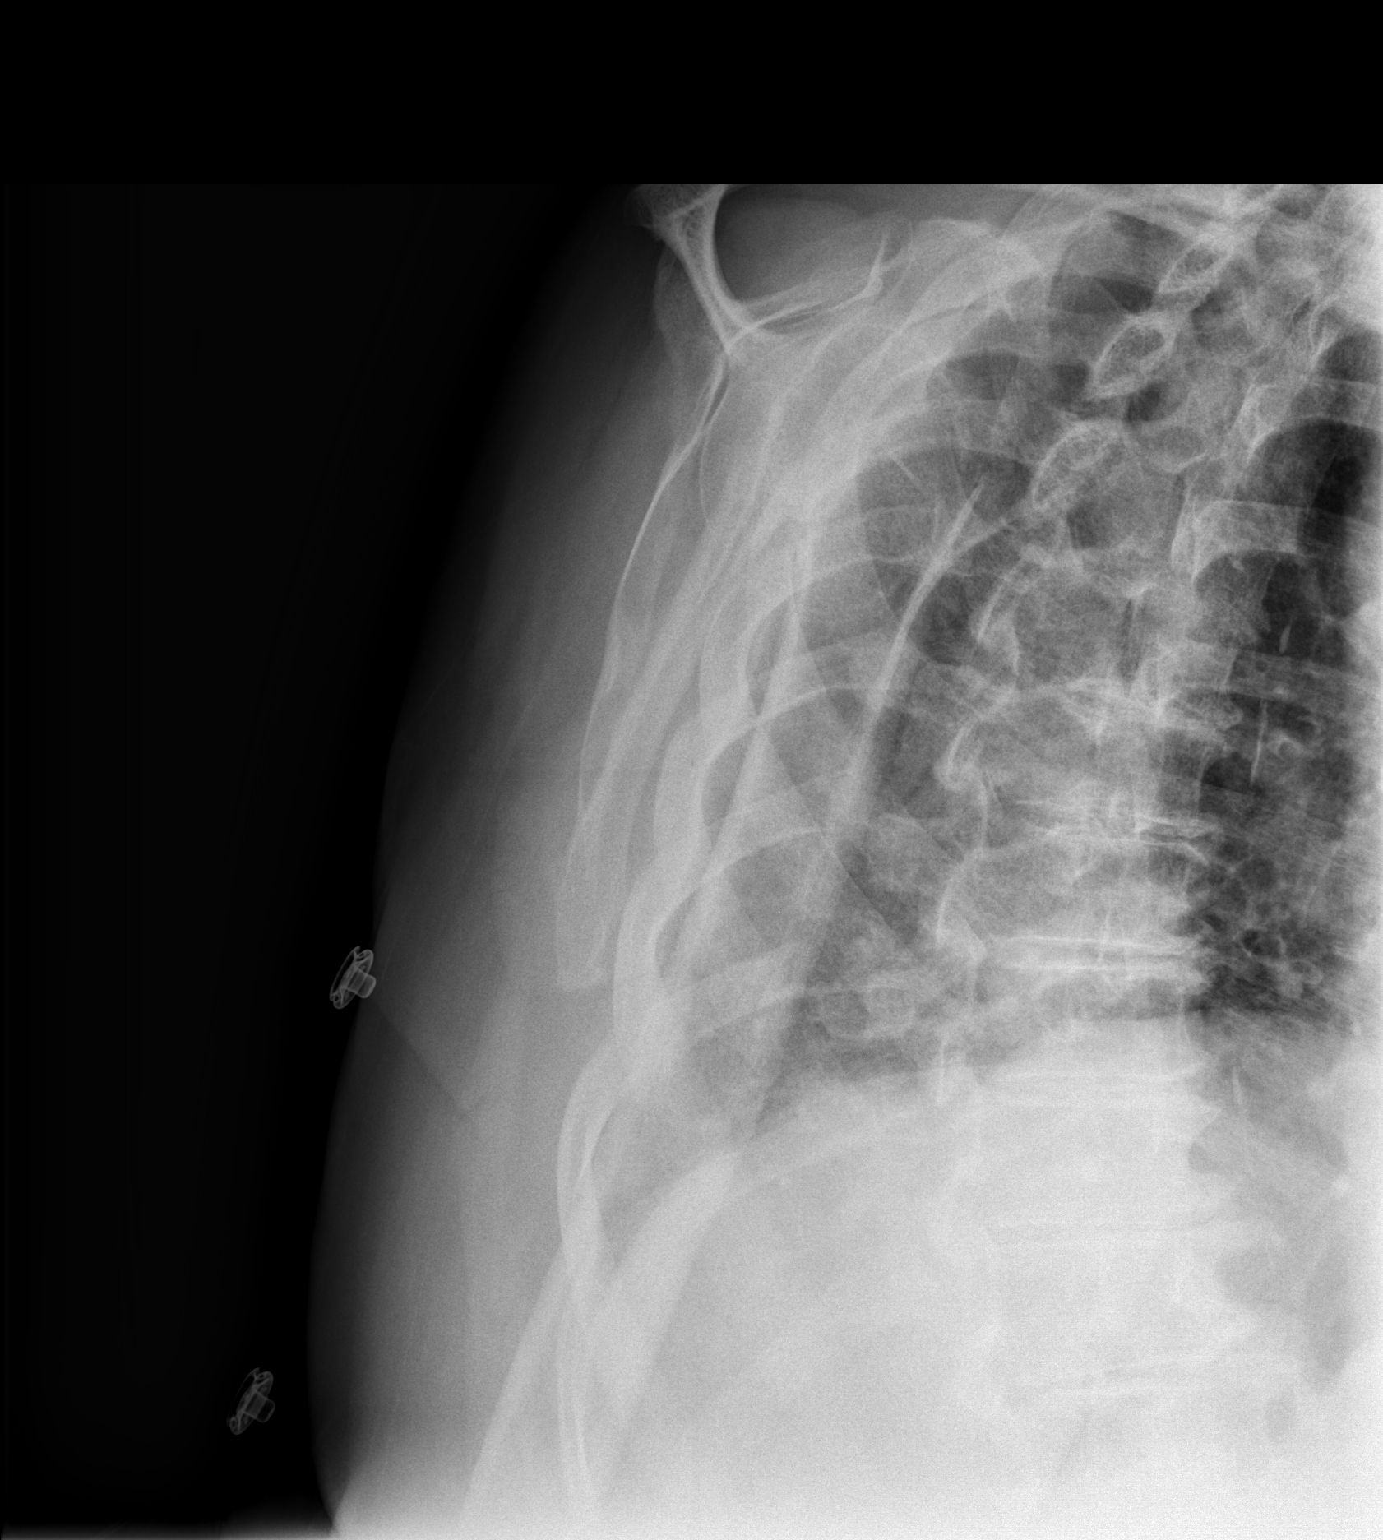

[3 of 3 positions shown; findings below may reference images not displayed]

FINDINGS: Interval comminuted, impacted fracture of the right
humeral head and neck.  Mild anterior and medial displacement of
the distal fragment as well as mild posterior and lateral
angulation of the distal fragment.  Extensive rotator cuff
calcifications.  Moderate superior acromioclavicular spur
formation.
IMPRESSION: 1.  Comminuted, impacted fracture of the right humeral head and
neck, as described above.
2.  Extensive rotator cuff calcific tendonitis.

## 2012-06-03 MED ORDER — LOSARTAN POTASSIUM 100 MG PO TABS: 100.0000 mg | ORAL_TABLET | Freq: Every day | ORAL | Status: DC

## 2012-06-03 NOTE — Assessment & Plan Note (Signed)
See notes from Dr. Darnelle Catalan from 7-10.  Her cell counts are stable with slight drop in hgb from mid-11 to mid-10.  Plan is closer monitoring (at [facility]'s office) and EPO if drops to 9.5 or sx.  No sx of anemia at present.

## 2012-06-03 NOTE — Assessment & Plan Note (Signed)
On no hypoglycemic meds.  A1c = 6.5.  Has app'ts with eye doctor.  Last urine alb normal and LDL-c was around 60 three years in a row -- not likely to change but will check at next visit.

## 2012-06-03 NOTE — Progress Notes (Signed)
4 woman with DM, mild HTN, and CLL (followed by Dr. Darnelle Catalan).  She is her usual delightful self and has no new complaints.  Reviewed all her meds and refilled cozaar. Introduced both Banker to our new intern who is from [place] as well and they seemed to hit it off (Dr. Aleda Grana).  I will continue to see them for a while for scheduled visits but a backup and a successor may be needed.

## 2012-06-03 NOTE — Assessment & Plan Note (Signed)
134/55.  She is on meds as listed but was unable to tolerate 80 mg of lasix; is on 40 mg. Weight stable and BP excellent.  No chest pain or orthopnea. Cor regular with occ ectopics.  Lungs clear.  No edema. Creatinine = 1.98; unchanged over 2 years.

## 2012-06-28 ENCOUNTER — Other Ambulatory Visit (HOSPITAL_BASED_OUTPATIENT_CLINIC_OR_DEPARTMENT_OTHER): Payer: Medicare Other | Admitting: Lab

## 2012-06-28 DIAGNOSIS — C9110 Chronic lymphocytic leukemia of B-cell type not having achieved remission: Secondary | ICD-10-CM

## 2012-06-28 LAB — COMPREHENSIVE METABOLIC PANEL
AST: 15 U/L (ref 0–37)
Albumin: 4.1 g/dL (ref 3.5–5.2)
Alkaline Phosphatase: 77 U/L (ref 39–117)
BUN: 42 mg/dL — ABNORMAL HIGH (ref 6–23)
Potassium: 4.6 mEq/L (ref 3.5–5.3)
Sodium: 140 mEq/L (ref 135–145)

## 2012-06-28 LAB — CBC & DIFF AND RETIC
Basophils Absolute: 0.1 10*3/uL (ref 0.0–0.1)
EOS%: 2.3 % (ref 0.0–7.0)
HGB: 10.5 g/dL — ABNORMAL LOW (ref 11.6–15.9)
MCH: 30.5 pg (ref 25.1–34.0)
MCV: 91 fL (ref 79.5–101.0)
MONO%: 10.3 % (ref 0.0–14.0)
NEUT%: 34.3 % — ABNORMAL LOW (ref 38.4–76.8)
RDW: 14.1 % (ref 11.2–14.5)

## 2012-08-09 ENCOUNTER — Other Ambulatory Visit (HOSPITAL_BASED_OUTPATIENT_CLINIC_OR_DEPARTMENT_OTHER): Payer: Medicare Other | Admitting: Lab

## 2012-08-09 DIAGNOSIS — C9110 Chronic lymphocytic leukemia of B-cell type not having achieved remission: Secondary | ICD-10-CM

## 2012-08-09 LAB — CBC & DIFF AND RETIC
Eosinophils Absolute: 0.3 10*3/uL (ref 0.0–0.5)
HCT: 31.8 % — ABNORMAL LOW (ref 34.8–46.6)
HGB: 10.4 g/dL — ABNORMAL LOW (ref 11.6–15.9)
Immature Retic Fract: 5.4 % (ref 1.60–10.00)
LYMPH%: 57.3 % — ABNORMAL HIGH (ref 14.0–49.7)
MONO#: 1.7 10*3/uL — ABNORMAL HIGH (ref 0.1–0.9)
NEUT#: 5.4 10*3/uL (ref 1.5–6.5)
NEUT%: 30.8 % — ABNORMAL LOW (ref 38.4–76.8)
Platelets: 325 10*3/uL (ref 145–400)
WBC: 17.6 10*3/uL — ABNORMAL HIGH (ref 3.9–10.3)

## 2012-08-09 LAB — COMPREHENSIVE METABOLIC PANEL (CC13)
ALT: 12 U/L (ref 0–55)
AST: 17 U/L (ref 5–34)
CO2: 24 mEq/L (ref 22–29)
Calcium: 9.8 mg/dL (ref 8.4–10.4)
Chloride: 106 mEq/L (ref 98–107)
Sodium: 141 mEq/L (ref 136–145)
Total Bilirubin: 0.5 mg/dL (ref 0.20–1.20)
Total Protein: 6.5 g/dL (ref 6.4–8.3)

## 2012-08-09 LAB — TECHNOLOGIST REVIEW

## 2012-09-02 ENCOUNTER — Other Ambulatory Visit: Payer: Self-pay | Admitting: Internal Medicine

## 2012-09-02 DIAGNOSIS — Z1231 Encounter for screening mammogram for malignant neoplasm of breast: Secondary | ICD-10-CM

## 2012-09-09 ENCOUNTER — Ambulatory Visit (INDEPENDENT_AMBULATORY_CARE_PROVIDER_SITE_OTHER): Payer: Medicare Other | Admitting: Internal Medicine

## 2012-09-09 ENCOUNTER — Encounter: Payer: Self-pay | Admitting: Internal Medicine

## 2012-09-09 VITALS — BP 113/51 | HR 78 | Temp 97.3°F | Ht <= 58 in | Wt 145.9 lb

## 2012-09-09 DIAGNOSIS — E119 Type 2 diabetes mellitus without complications: Secondary | ICD-10-CM

## 2012-09-09 DIAGNOSIS — I10 Essential (primary) hypertension: Secondary | ICD-10-CM

## 2012-09-09 MED ORDER — LOSARTAN POTASSIUM 100 MG PO TABS: 100.0000 mg | ORAL_TABLET | Freq: Every day | ORAL | Status: DC

## 2012-09-09 MED ORDER — AMLODIPINE BESYLATE 10 MG PO TABS: 10.0000 mg | ORAL_TABLET | Freq: Every day | ORAL | Status: DC

## 2012-09-09 NOTE — Progress Notes (Signed)
29 woman with no complaints.  She has very mold HTN and DM well-controlled.  No CV complaints.  She has stable CLL followed by Dr. Darnelle Catalan.  He does frequent CBCs -- last in Sept. 2013 with stable values. Chemistry in Sept had creatinine 2.0 (has been close to that for at least 2 years.  Will get urine albumin today. Lungs clear.  Cor regular w/o murmur.  No carotid bruits.

## 2012-09-10 LAB — MICROALBUMIN / CREATININE URINE RATIO
Creatinine, Urine: 107.2 mg/dL
Microalb, Ur: 2.85 mg/dL — ABNORMAL HIGH (ref 0.00–1.89)

## 2012-09-20 ENCOUNTER — Other Ambulatory Visit (HOSPITAL_BASED_OUTPATIENT_CLINIC_OR_DEPARTMENT_OTHER): Payer: Medicare Other

## 2012-09-20 DIAGNOSIS — C9110 Chronic lymphocytic leukemia of B-cell type not having achieved remission: Secondary | ICD-10-CM

## 2012-09-20 LAB — MANUAL DIFFERENTIAL
Band Neutrophils: 0 % (ref 0–10)
Basophil: 1 % (ref 0–2)
Blasts: 0 % (ref 0–0)
EOS: 1 % (ref 0–7)
LYMPH: 71 % — ABNORMAL HIGH (ref 14–49)
MONO: 4 % (ref 0–14)
Metamyelocytes: 0 % (ref 0–0)
PLT EST: ADEQUATE
nRBC: 0 % (ref 0–0)

## 2012-09-20 LAB — COMPREHENSIVE METABOLIC PANEL (CC13)
ALT: 14 U/L (ref 0–55)
AST: 20 U/L (ref 5–34)
Alkaline Phosphatase: 80 U/L (ref 40–150)
Creatinine: 1.8 mg/dL — ABNORMAL HIGH (ref 0.6–1.1)
Sodium: 142 mEq/L (ref 136–145)
Total Bilirubin: 0.46 mg/dL (ref 0.20–1.20)
Total Protein: 6.9 g/dL (ref 6.4–8.3)

## 2012-09-20 LAB — CBC & DIFF AND RETIC
HGB: 11 g/dL — ABNORMAL LOW (ref 11.6–15.9)
Immature Retic Fract: 4 % (ref 1.60–10.00)
MCV: 94.1 fL (ref 79.5–101.0)
RBC: 3.56 10*6/uL — ABNORMAL LOW (ref 3.70–5.45)
RDW: 13.5 % (ref 11.2–14.5)
Retic Ct Abs: 49.84 10*3/uL (ref 33.70–90.70)
WBC: 18.6 10*3/uL — ABNORMAL HIGH (ref 3.9–10.3)

## 2012-10-06 ENCOUNTER — Ambulatory Visit
Admission: RE | Admit: 2012-10-06 | Discharge: 2012-10-06 | Disposition: A | Discharge status: Home / Self Care | Payer: Medicare Other | Source: Ambulatory Visit | Attending: Internal Medicine | Admitting: Internal Medicine

## 2012-10-06 DIAGNOSIS — Z1231 Encounter for screening mammogram for malignant neoplasm of breast: Secondary | ICD-10-CM

## 2012-11-01 ENCOUNTER — Other Ambulatory Visit (HOSPITAL_BASED_OUTPATIENT_CLINIC_OR_DEPARTMENT_OTHER): Payer: Medicare Other | Admitting: Lab

## 2012-11-01 DIAGNOSIS — C9110 Chronic lymphocytic leukemia of B-cell type not having achieved remission: Secondary | ICD-10-CM

## 2012-11-01 LAB — CBC & DIFF AND RETIC
Basophils Absolute: 0.2 10*3/uL — ABNORMAL HIGH (ref 0.0–0.1)
Eosinophils Absolute: 0.3 10*3/uL (ref 0.0–0.5)
LYMPH%: 68 % — ABNORMAL HIGH (ref 14.0–49.7)
MCH: 31.1 pg (ref 25.1–34.0)
MCV: 94.8 fL (ref 79.5–101.0)
MONO%: 6 % (ref 0.0–14.0)
NEUT#: 5.4 10*3/uL (ref 1.5–6.5)
Platelets: 345 10*3/uL (ref 145–400)
RBC: 3.34 10*6/uL — ABNORMAL LOW (ref 3.70–5.45)

## 2012-11-01 LAB — COMPREHENSIVE METABOLIC PANEL (CC13)
AST: 17 U/L (ref 5–34)
Alkaline Phosphatase: 111 U/L (ref 40–150)
Glucose: 176 mg/dl — ABNORMAL HIGH (ref 70–99)
Sodium: 142 mEq/L (ref 136–145)
Total Bilirubin: 0.38 mg/dL (ref 0.20–1.20)
Total Protein: 6.9 g/dL (ref 6.4–8.3)

## 2012-12-05 ENCOUNTER — Ambulatory Visit (INDEPENDENT_AMBULATORY_CARE_PROVIDER_SITE_OTHER): Payer: Medicare Other | Admitting: Internal Medicine

## 2012-12-05 ENCOUNTER — Ambulatory Visit (HOSPITAL_COMMUNITY)
Admission: RE | Admit: 2012-12-05 | Discharge: 2012-12-05 | Disposition: A | Discharge status: Home / Self Care | Payer: Medicare Other | Source: Ambulatory Visit | Attending: Internal Medicine | Admitting: Internal Medicine

## 2012-12-05 ENCOUNTER — Encounter: Payer: Self-pay | Admitting: Internal Medicine

## 2012-12-05 VITALS — BP 167/65 | HR 78 | Temp 97.8°F | Wt 153.1 lb

## 2012-12-05 DIAGNOSIS — M7989 Other specified soft tissue disorders: Secondary | ICD-10-CM

## 2012-12-05 DIAGNOSIS — E119 Type 2 diabetes mellitus without complications: Secondary | ICD-10-CM

## 2012-12-05 DIAGNOSIS — M79609 Pain in unspecified limb: Secondary | ICD-10-CM | POA: Insufficient documentation

## 2012-12-05 DIAGNOSIS — I6529 Occlusion and stenosis of unspecified carotid artery: Secondary | ICD-10-CM

## 2012-12-05 DIAGNOSIS — N182 Chronic kidney disease, stage 2 (mild): Secondary | ICD-10-CM

## 2012-12-05 DIAGNOSIS — I10 Essential (primary) hypertension: Secondary | ICD-10-CM

## 2012-12-05 DIAGNOSIS — E785 Hyperlipidemia, unspecified: Secondary | ICD-10-CM

## 2012-12-05 DIAGNOSIS — Z0000 Encounter for general adult medical examination without abnormal findings: Secondary | ICD-10-CM

## 2012-12-05 DIAGNOSIS — R609 Edema, unspecified: Secondary | ICD-10-CM

## 2012-12-05 DIAGNOSIS — Z23 Encounter for immunization: Secondary | ICD-10-CM

## 2012-12-05 DIAGNOSIS — R600 Localized edema: Secondary | ICD-10-CM

## 2012-12-05 DIAGNOSIS — K219 Gastro-esophageal reflux disease without esophagitis: Secondary | ICD-10-CM

## 2012-12-05 LAB — GLUCOSE, CAPILLARY: Glucose-Capillary: 129 mg/dL — ABNORMAL HIGH (ref 70–99)

## 2012-12-05 LAB — POCT GLYCOSYLATED HEMOGLOBIN (HGB A1C): Hemoglobin A1C: 6.4

## 2012-12-05 MED ORDER — ASPIRIN-DIPYRIDAMOLE ER 25-200 MG PO CP12: 1.0000 | ORAL_CAPSULE | Freq: Two times a day (BID) | ORAL | Status: DC

## 2012-12-05 MED ORDER — OMEPRAZOLE 20 MG PO CPDR: 20.0000 mg | DELAYED_RELEASE_CAPSULE | Freq: Every day | ORAL | Status: DC

## 2012-12-05 MED ORDER — SIMVASTATIN 20 MG PO TABS: 20.0000 mg | ORAL_TABLET | Freq: Every day | ORAL | Status: DC

## 2012-12-05 NOTE — Progress Notes (Signed)
VASCULAR LAB PRELIMINARY  PRELIMINARY  PRELIMINARY  PRELIMINARY  Bilateral lower extremity venous duplex  completed.    Preliminary report:  No obvious deep or superficial vein thrombosis noted bilaterally.  [name], RVT 12/05/2012, 4:53 PM

## 2012-12-05 NOTE — Patient Instructions (Addendum)
-  Use compression stokcking for your swollen leg and maintain your leg elevated. Usar [employer] com compressao para [place] inchada e mantehna sua Ragan.  -Your lower extremity ultrasound was negative for blood clots. Seu ultrason de suas pernas foi negativo para coagulo sanguinio.  -You may return in 3 months for recheck of your blood pressure.

## 2012-12-06 DIAGNOSIS — R600 Localized edema: Secondary | ICD-10-CM | POA: Insufficient documentation

## 2012-12-06 DIAGNOSIS — Z23 Encounter for immunization: Secondary | ICD-10-CM | POA: Insufficient documentation

## 2012-12-06 LAB — BASIC METABOLIC PANEL WITH GFR
BUN: 34 mg/dL — ABNORMAL HIGH (ref 6–23)
CO2: 25 mEq/L (ref 19–32)
Chloride: 103 mEq/L (ref 96–112)
Creat: 1.85 mg/dL — ABNORMAL HIGH (ref 0.50–1.10)
GFR, Est Non African American: 24 mL/min — ABNORMAL LOW
Glucose, Bld: 152 mg/dL — ABNORMAL HIGH (ref 70–99)
Potassium: 5.4 mEq/L — ABNORMAL HIGH (ref 3.5–5.3)

## 2012-12-06 LAB — LIPID PANEL
HDL: 49 mg/dL (ref >39)
Triglycerides: 80 mg/dL (ref <150)

## 2012-12-06 NOTE — Assessment & Plan Note (Signed)
Patient received influenza vaccination.  

## 2012-12-06 NOTE — Assessment & Plan Note (Signed)
Stable creatinine at 1.85, appears to be her baseline.

## 2012-12-06 NOTE — Assessment & Plan Note (Signed)
Lab Results  Component Value Date   HGBA1C 6.4 12/05/2012   HGBA1C 6.1 09/09/2012   HGBA1C 6.5 06/03/2012     Assessment:  Diabetes control:  Well controlled  Progress toward A1C goal:   Achieved  Comments: Continue lifestyle changes  Plan:  Medications:  continue current medications, ARB and ASA  Home glucose monitoring:   Frequency:  None   Timing:  None  Instruction/counseling given: reminded to bring medications to each visit  Educational resources provided: None  Self management tools provided: None

## 2012-12-06 NOTE — Progress Notes (Signed)
  Subjective:    Patient ID: [name], female    DOB: [birth year], 79 y.o.   MRN: [id number]  HPI [name] is a 79 year old woman, immigrant from [place] residing in the [place] for decades now, with PMH of stable CLL, HTN, hyperlipidemia, carotid artery stenosis, bilateral knee arthropathy who comes in today for a routine follow up visit. She complains of right lower leg swelling that has been present for one week. She denies calf pain or tenderness. She states that years ago she had swelling in her left leg that subsided on its own. She states that often she has LE edema and noticed that she has been sleeping more on her right side lately but does not recall trauma to her leg and denies recent travel or prolonged immobilization.   She requested printed prescriptions for some of her medications as they are sent to a mail order pharmacy.   She had no other complaints today.   Review of Systems  Constitutional: Negative for chills, diaphoresis, activity change and appetite change.  Respiratory: Negative for cough, choking, chest tightness and shortness of breath.   Cardiovascular: Positive for leg swelling. Negative for chest pain and palpitations.  Gastrointestinal: Negative for abdominal pain and abdominal distention.  Musculoskeletal: Positive for back pain, arthralgias and gait problem. Negative for myalgias and joint swelling.       These are chronic  Skin: Negative for color change, pallor, rash and wound.  Neurological: Negative for dizziness, light-headedness, numbness and headaches.  Hematological: Does not bruise/bleed easily.  Psychiatric/Behavioral: Negative for behavioral problems.       Objective:   Physical Exam  Nursing note and vitals reviewed. Constitutional: She is oriented to person, place, and time. She appears well-developed and well-nourished. No distress.  HENT:  Nose: Nose normal.  Eyes: Conjunctivae normal are normal.  Cardiovascular: Normal rate and  regular rhythm.   Pulmonary/Chest: Effort normal and breath sounds normal. No respiratory distress. She has no wheezes. She has no rales. She exhibits no tenderness.  Abdominal: Soft. Bowel sounds are normal.  Musculoskeletal: She exhibits edema. She exhibits no tenderness.       Right lower leg with 2+pitting edema up to her knee, with no discoloration, no calf tenderness or erythema, no palpable cord and full ROM. Bilateral 1+pedal pulses. Left LE with trace pitting edema up to her knee.   Neurological: She is alert and oriented to person, place, and time.  Skin: Skin is warm and dry. No rash noted. She is not diaphoretic. No erythema. No pallor.  Psychiatric: She has a normal mood and affect. Her behavior is normal.          Assessment & Plan:

## 2012-12-06 NOTE — Assessment & Plan Note (Signed)
BP elevated today, pt assures compliance to her medications. She attributes her elevated BP to rushing to be on time for her appointment today. Repeat BP still elevated but in the setting of patient having just undergone bilateral LE Duplex. Will reassess BP during her next visit.

## 2012-12-06 NOTE — Assessment & Plan Note (Signed)
LDL of 108, close to her goal of <100. Continue Zocor and lifestyle modification.

## 2012-12-06 NOTE — Assessment & Plan Note (Signed)
Pt stated that she had left leg swelling years ago that subsided on its own. Right leg swelling for 1 week prior to her presentation during this visit with 2+ pitting edema. Differential to include DVT (she has CLL, age of 45, unilateral swelling, wells score of 3 putting her at high risk for DVT) and dependent edema (she states sleeping more on her right side lately). Bilateral LE duplex negative for DVT. D-dimer note ordered for concerns of being falsely elevated in context of her CKD. Pt was given prescription for compression stocking with instruction to keep her leg elevated. She was encouraged to return to the clinic if her swelling persisted or worsened.

## 2012-12-13 ENCOUNTER — Other Ambulatory Visit (HOSPITAL_BASED_OUTPATIENT_CLINIC_OR_DEPARTMENT_OTHER): Payer: Medicare Other | Admitting: Lab

## 2012-12-13 DIAGNOSIS — C9110 Chronic lymphocytic leukemia of B-cell type not having achieved remission: Secondary | ICD-10-CM

## 2012-12-13 LAB — MANUAL DIFFERENTIAL
Basophil: 1 % (ref 0–2)
EOS: 2 % (ref 0–7)
LYMPH: 62 % — ABNORMAL HIGH (ref 14–49)
MONO: 5 % (ref 0–14)
Metamyelocytes: 0 % (ref 0–0)
Myelocytes: 0 % (ref 0–0)
PLT EST: ADEQUATE
RBC Comments: NORMAL

## 2012-12-13 LAB — CBC & DIFF AND RETIC
MCHC: 32.6 g/dL (ref 31.5–36.0)
Platelets: 345 10*3/uL (ref 145–400)
RBC: 3.41 10*6/uL — ABNORMAL LOW (ref 3.70–5.45)
Retic Ct Abs: 51.83 10*3/uL (ref 33.70–90.70)

## 2012-12-13 LAB — COMPREHENSIVE METABOLIC PANEL (CC13)
ALT: 14 U/L (ref 0–55)
Albumin: 3.7 g/dL (ref 3.5–5.0)
BUN: 36 mg/dL — ABNORMAL HIGH (ref 7.0–26.0)
CO2: 27 mEq/L (ref 22–29)
Chloride: 103 mEq/L (ref 98–107)
Creatinine: 1.9 mg/dL — ABNORMAL HIGH (ref 0.6–1.1)
Glucose: 170 mg/dl — ABNORMAL HIGH (ref 70–99)
Total Protein: 7.2 g/dL (ref 6.4–8.3)

## 2013-01-19 ENCOUNTER — Ambulatory Visit (INDEPENDENT_AMBULATORY_CARE_PROVIDER_SITE_OTHER): Payer: Medicare Other | Admitting: Internal Medicine

## 2013-01-19 ENCOUNTER — Encounter: Payer: Self-pay | Admitting: Internal Medicine

## 2013-01-19 VITALS — BP 126/51 | HR 80 | Temp 97.9°F | Ht <= 58 in | Wt 148.5 lb

## 2013-01-19 DIAGNOSIS — D539 Nutritional anemia, unspecified: Secondary | ICD-10-CM

## 2013-01-19 DIAGNOSIS — E119 Type 2 diabetes mellitus without complications: Secondary | ICD-10-CM

## 2013-01-19 DIAGNOSIS — D649 Anemia, unspecified: Secondary | ICD-10-CM

## 2013-01-19 DIAGNOSIS — Z23 Encounter for immunization: Secondary | ICD-10-CM

## 2013-01-19 DIAGNOSIS — R600 Localized edema: Secondary | ICD-10-CM

## 2013-01-19 DIAGNOSIS — R609 Edema, unspecified: Secondary | ICD-10-CM

## 2013-01-19 DIAGNOSIS — R040 Epistaxis: Secondary | ICD-10-CM

## 2013-01-19 DIAGNOSIS — I10 Essential (primary) hypertension: Secondary | ICD-10-CM

## 2013-01-19 MED ORDER — SALINE NASAL SPRAY 0.65 % NA SOLN: 1.0000 | NASAL | Status: DC | PRN

## 2013-01-19 MED ORDER — AMLODIPINE BESYLATE 10 MG PO TABS: 10.0000 mg | ORAL_TABLET | Freq: Every day | ORAL | Status: DC

## 2013-01-19 MED ORDER — FUROSEMIDE 40 MG PO TABS: 40.0000 mg | ORAL_TABLET | Freq: Every day | ORAL | Status: DC

## 2013-01-19 MED ORDER — LOSARTAN POTASSIUM 100 MG PO TABS: 100.0000 mg | ORAL_TABLET | Freq: Every day | ORAL | Status: DC

## 2013-01-19 NOTE — Patient Instructions (Addendum)
General Instructions: -Use o spray de agua saline uma ou duas vezes ao dia para prevenir mais sangramento do [place].  -Pare de tomar Garlic ou supplemento de alho -Continue tomando seus outros remedios -Marque proxima visita para dentro de tres meses ou antes se a senhora tiver algum problema de saudade  -Foi um prazer ver-la [place] Level!   Treatment Goals:  Goals (1 Years of Data) as of 01/19/13         As of Today 12/05/12 12/05/12 09/09/12 06/03/12     Blood Pressure    . Blood Pressure < 140/90  126/51 167/65 166/72 113/51 134/55     Result Component    . HEMOGLOBIN A1C < 7.0   6.4  6.1 6.5    . LDL CALC < 100   108         Progress Toward Treatment Goals:  Treatment Goal 01/19/2013  Blood pressure at goal    Self Care Goals & Plans:  Self Care Goal 12/05/2012  Manage my medications take my medicines as prescribed; bring my medications to every visit; refill my medications on time; follow the sick day instructions if I am sick  Monitor my health keep track of my weight  Eat healthy foods eat more vegetables; eat fruit for snacks and desserts; eat baked foods instead of fried foods; drink diet soda or water instead of juice or soda  Be physically active find an activity I enjoy       Care Management & Community Referrals:  Referral 01/19/2013  Referrals made for care management support none needed      Hemorragia do Nariz (Nosebleed) Hemorragias nasais podem ser causadas por diversos fatores, incluindo pancadas, infeces, plipos, corpos estranhos, membranas mucosas secas ou clima, medicaes e ar condicionado. A [facility] hemorragias nasais ocorre na regio frontal do nariz. Devido a Administrator, a maioria desses sangramentos pode ser controlada apertando as narinas de forma gentil e contnua. Faa isso por pelo menos 10 a 20 minutos. O motivo para essa presso contnua  que ela deve ser [employer] at [employer]. Se a presso for [employer] perodo  de 10 a 20 minutos, o processo pode ter de ser reiniciado. A hemorragia nasal pode parar por conta prpria ou pela presso, pode precisar de calor concentrado (cauterizao) ou parar pela presso de um tampo. INSTRUES PARA TRATAMENTO DOMICILIAR  Se o nariz foi tamponado, tenta manter o tampo dentro at ser removido pelo mdico. Se um tampo de gaze foi usado e ele comear a se soltar, recoloque-o gentilmente ou corte a ponta. Caso tenha sido usado um cateter de balo para tampar o nariz, NO corte. Em outros casos, no remova o tampo a menos que seja instrudo a isso.  Evita assoar o nariz por 12 horas depois do tratamento. Isto podia deslocar o tampo ou cogulo e recomear o sangramento.  Se o sangramento recomea, sente-se e dobre-se para a frente, apertando delicadamente metade do nariz, continuamente, por vinte minutos.  Se o sangramento foi causado por membrana mucosa seca, passe vaselina ou ungento de bacitracina na parte interna do nariz usando a ponta do dedo mindinho Psychiatric nurse. Proceda deste modo quando necessrio durante tempo seco. Isto manter as membranas de mucosas midas e permite que curem.  Mantenha a umidade em sua casa usando menos ar condicionado ou use um [employer].  No use aspirina ou medicamentos que possam provocar sangramento. Seu mdico lhe dar recomendaes.  Retome suas atividades normais quando se sentir capaz,  mas tente evitar esforos, levantar ou dobrar na altura da cintura durante vrios dias.  Se o sangramento se tornar recorrente e sem causa aparente, seu mdico pode sugerir um esquema de [place]. PROCURE ASSISTNCIA MDICA IMEDIATAMENTE SE:  Se o sangramento voltar e no pode ser controlado.  Se houver sangramento incomum decorrente de ferir outras partes do corpo.  Tiver febre.  Se o sangramento continuar.  Se a condio que o trouxe Geographical information systems officer.  Se sentir vertigem, desmaio, suar ou vomitar de sangue. CERTIFIQUE-SE DE:  Compreende as  instrues referentes  alta.  Ir monitorar sua condio.  Procurar assistncia mdica imediatamente, conforme indicado. Document Released: 08/19/2005 Document Revised: 02/01/2012 [facility] Patient Information 2013 [place], [place].

## 2013-01-19 NOTE — Progress Notes (Signed)
  Subjective:    Patient ID: [name], female    DOB: 1921-10-01, 79 y.o.   MRN: [id number]  HPI [name] is a pleasant 79 year old woman with PMH significant for CLL, HTN, CKD stage 2, DM2, and CAD who comes in for follow up visit for her chronic medical problems and epistaxis. She describes being in her usual state of health until one occasion when she blew her nose too hard and noticed blood in the tissue. That particular nosebleed took almost two hours to subside. She reports 7 episodes of nosebleed this month with minimum bleeding, no more than 1-2 table spoons of blood per episode; the last episode was 6 days ago. She had mild nausea after the nosebleed but no vomiting. She had nosebleeds in the past but not recently. She denies fever/chills, nasal congestion, rhinorrhea, allergic rhinitis, nose pain, or history of nasal polyps.  Of note, her right LE edema has completely subsided now.    Review of Systems  Constitutional: Positive for fatigue. Negative for fever, chills, diaphoresis, activity change, appetite change and unexpected weight change.  HENT: Positive for nosebleeds. Negative for congestion, sore throat, facial swelling, rhinorrhea, postnasal drip and sinus pressure.   Eyes: Negative for pain and itching.  Respiratory: Negative for cough, chest tightness, shortness of breath and wheezing.   Cardiovascular: Negative for chest pain, palpitations and leg swelling.  Gastrointestinal: Positive for nausea. Negative for vomiting, abdominal pain and blood in stool.  Genitourinary: Negative for dysuria, frequency, hematuria and difficulty urinating.  Musculoskeletal: Negative for myalgias.  Skin: Negative for color change, pallor, rash and wound.  Neurological: Negative for dizziness, syncope, weakness and headaches.  Hematological: Negative for adenopathy. Does not bruise/bleed easily.  Psychiatric/Behavioral: Negative for behavioral problems and agitation.       Objective:    Physical Exam  Nursing note and vitals reviewed. Constitutional: She is oriented to person, place, and time. She appears well-developed and well-nourished. No distress.  HENT:  Mouth/Throat: Oropharynx is clear and moist. No oropharyngeal exudate.  Left septum with anterior scab of dry blood of ~16mm in diameter with no surrounding edema and with no discharge. Right septum normal. No anterior polyps noted in bilateral nostrils. Turbinates with no erythema or edema bilaterally, no nasal discharge.   Eyes: Conjunctivae are normal. Right eye exhibits no discharge. Left eye exhibits no discharge. No scleral icterus.  Cardiovascular: Normal rate and regular rhythm.   Pulmonary/Chest: Effort normal and breath sounds normal. No respiratory distress. She has no wheezes. She has no rales. She exhibits no tenderness.  Musculoskeletal: She exhibits no edema and no tenderness.  Lymphadenopathy:    She has no cervical adenopathy.  Neurological: She is alert and oriented to person, place, and time.  Skin: Skin is warm and dry. No rash noted. She is not diaphoretic. No erythema. No pallor.  Psychiatric: She has a normal mood and affect. Her behavior is normal.          Assessment & Plan:

## 2013-01-20 DIAGNOSIS — R040 Epistaxis: Secondary | ICD-10-CM | POA: Insufficient documentation

## 2013-01-20 NOTE — Assessment & Plan Note (Signed)
Edema completely resolved with leg elevation

## 2013-01-20 NOTE — Assessment & Plan Note (Addendum)
Ordered anemia panel 

## 2013-01-20 NOTE — Assessment & Plan Note (Signed)
BP well controlled. Printed prescriptions for Lasix, amlodipine, and Cozaar

## 2013-01-20 NOTE — Assessment & Plan Note (Addendum)
She reports minimum blood loss but first episode lasted for two hours. Last episode 6 days ago. She is on Aggrenox. Likely provoking event was blowing of the nose forcefully. Source of nosebleeds is always from left nostril. Left septum with anterior scab of dry blood, likely the source of her bleed. No anterior nasal polyps noted on physical exam. She was advised to avoid nose blowing and to apply pressure to nasal bridge to stop the nosebleeds as well as application of pressure with tissue.  -She was advised to stop taking garlic extract supplementation to avoid possible additive effects to Aggrenox--some reports of increased clotting time with garlic extract although not very well supported by the literature -saline spray twice per day in both nostrils as needed to prevent further nosebleeds from nasal dryness -CBC, BMET, anemia panel

## 2013-01-24 ENCOUNTER — Other Ambulatory Visit (HOSPITAL_BASED_OUTPATIENT_CLINIC_OR_DEPARTMENT_OTHER): Payer: Medicare Other

## 2013-01-24 DIAGNOSIS — C9110 Chronic lymphocytic leukemia of B-cell type not having achieved remission: Secondary | ICD-10-CM

## 2013-01-24 LAB — COMPREHENSIVE METABOLIC PANEL (CC13)
ALT: 13 U/L (ref 0–55)
AST: 19 U/L (ref 5–34)
Albumin: 3.7 g/dL (ref 3.5–5.0)
CO2: 25 mEq/L (ref 22–29)
Calcium: 9.5 mg/dL (ref 8.4–10.4)
Chloride: 107 mEq/L (ref 98–107)
Creatinine: 1.8 mg/dL — ABNORMAL HIGH (ref 0.6–1.1)
Potassium: 4.6 mEq/L (ref 3.5–5.1)
Sodium: 143 mEq/L (ref 136–145)
Total Protein: 6.9 g/dL (ref 6.4–8.3)

## 2013-01-24 LAB — CBC & DIFF AND RETIC
BASO%: 0.2 % (ref 0.0–2.0)
EOS%: 1.8 % (ref 0.0–7.0)
HCT: 29.4 % — ABNORMAL LOW (ref 34.8–46.6)
Immature Retic Fract: 17.4 % — ABNORMAL HIGH (ref 1.60–10.00)
MCH: 29.5 pg (ref 25.1–34.0)
MCHC: 31.6 g/dL (ref 31.5–36.0)
MONO#: 1.6 10*3/uL — ABNORMAL HIGH (ref 0.1–0.9)
NEUT%: 31.4 % — ABNORMAL LOW (ref 38.4–76.8)
RBC: 3.15 10*6/uL — ABNORMAL LOW (ref 3.70–5.45)
RDW: 14.5 % (ref 11.2–14.5)
Retic %: 2.44 % — ABNORMAL HIGH (ref 0.70–2.10)
WBC: 17.5 10*3/uL — ABNORMAL HIGH (ref 3.9–10.3)
lymph#: 10 10*3/uL — ABNORMAL HIGH (ref 0.9–3.3)

## 2013-01-24 LAB — TECHNOLOGIST REVIEW

## 2013-03-07 ENCOUNTER — Other Ambulatory Visit (HOSPITAL_BASED_OUTPATIENT_CLINIC_OR_DEPARTMENT_OTHER): Payer: Medicare Other | Admitting: Lab

## 2013-03-07 DIAGNOSIS — C9110 Chronic lymphocytic leukemia of B-cell type not having achieved remission: Secondary | ICD-10-CM

## 2013-03-07 LAB — CBC & DIFF AND RETIC
Basophils Absolute: 0.2 10*3/uL — ABNORMAL HIGH (ref 0.0–0.1)
HCT: 29.5 % — ABNORMAL LOW (ref 34.8–46.6)
HGB: 9.6 g/dL — ABNORMAL LOW (ref 11.6–15.9)
Immature Retic Fract: 12.3 % — ABNORMAL HIGH (ref 1.60–10.00)
LYMPH%: 64.5 % — ABNORMAL HIGH (ref 14.0–49.7)
MONO#: 1.6 10*3/uL — ABNORMAL HIGH (ref 0.1–0.9)
NEUT%: 26.7 % — ABNORMAL LOW (ref 38.4–76.8)
Platelets: 370 10*3/uL (ref 145–400)
Retic Ct Abs: 62.34 10*3/uL (ref 33.70–90.70)
WBC: 23.9 10*3/uL — ABNORMAL HIGH (ref 3.9–10.3)
lymph#: 15.4 10*3/uL — ABNORMAL HIGH (ref 0.9–3.3)

## 2013-03-07 LAB — COMPREHENSIVE METABOLIC PANEL (CC13)
AST: 20 U/L (ref 5–34)
BUN: 28.7 mg/dL — ABNORMAL HIGH (ref 7.0–26.0)
Calcium: 9.2 mg/dL (ref 8.4–10.4)
Chloride: 104 mEq/L (ref 98–107)
Creatinine: 2 mg/dL — ABNORMAL HIGH (ref 0.6–1.1)
Total Bilirubin: 0.35 mg/dL (ref 0.20–1.20)

## 2013-04-03 ENCOUNTER — Ambulatory Visit (INDEPENDENT_AMBULATORY_CARE_PROVIDER_SITE_OTHER): Payer: Medicare Other | Admitting: Internal Medicine

## 2013-04-03 VITALS — BP 123/54 | HR 82 | Temp 97.8°F | Wt 144.0 lb

## 2013-04-03 DIAGNOSIS — E119 Type 2 diabetes mellitus without complications: Secondary | ICD-10-CM

## 2013-04-03 DIAGNOSIS — C9110 Chronic lymphocytic leukemia of B-cell type not having achieved remission: Secondary | ICD-10-CM

## 2013-04-03 DIAGNOSIS — Z23 Encounter for immunization: Secondary | ICD-10-CM

## 2013-04-03 DIAGNOSIS — I10 Essential (primary) hypertension: Secondary | ICD-10-CM

## 2013-04-03 DIAGNOSIS — R279 Unspecified lack of coordination: Secondary | ICD-10-CM

## 2013-04-03 DIAGNOSIS — N182 Chronic kidney disease, stage 2 (mild): Secondary | ICD-10-CM

## 2013-04-03 DIAGNOSIS — E785 Hyperlipidemia, unspecified: Secondary | ICD-10-CM

## 2013-04-03 DIAGNOSIS — K219 Gastro-esophageal reflux disease without esophagitis: Secondary | ICD-10-CM

## 2013-04-03 LAB — POCT GLYCOSYLATED HEMOGLOBIN (HGB A1C): Hemoglobin A1C: 6.2

## 2013-04-03 LAB — GLUCOSE, CAPILLARY: Glucose-Capillary: 161 mg/dL — ABNORMAL HIGH (ref 70–99)

## 2013-04-03 MED ORDER — OMEPRAZOLE 20 MG PO CPDR: 20.0000 mg | DELAYED_RELEASE_CAPSULE | Freq: Every day | ORAL | Status: DC

## 2013-04-03 MED ORDER — ASPIRIN-DIPYRIDAMOLE ER 25-200 MG PO CP12: 1.0000 | ORAL_CAPSULE | Freq: Two times a day (BID) | ORAL | Status: DC

## 2013-04-03 MED ORDER — SIMVASTATIN 20 MG PO TABS: 20.0000 mg | ORAL_TABLET | Freq: Every day | ORAL | Status: DC

## 2013-04-03 NOTE — Patient Instructions (Signed)
General Instructions: -Nao precisa tomar a aspirina porque o aggrenox ja tem aspirina.  -Visita de seguimento em tres meses ou antes se a senhora tiver algum problema de saude.   Treatment Goals:  Goals (1 Years of Data) as of 04/03/13         As of Today 01/19/13 12/05/12 12/05/12 09/09/12     Blood Pressure    . Blood Pressure < 140/90  123/54 126/51 167/65 166/72 113/51     Result Component    . HEMOGLOBIN A1C < 7.0  6.2  6.4  6.1    . LDL CALC < 100    108        Progress Toward Treatment Goals:  Treatment Goal 04/03/2013  Hemoglobin A1C unable to assess  Blood pressure unchanged    Self Care Goals & Plans:  Self Care Goal 04/03/2013  Manage my medications take my medicines as prescribed; bring my medications to every visit; refill my medications on time  Monitor my health (No Data)  Eat healthy foods eat foods that are low in salt; eat baked foods instead of fried foods  Be physically active find an activity I enjoy    Home Blood Glucose Monitoring 04/03/2013  Check my blood sugar no home glucose monitoring     Care Management & Community Referrals:  Referral 01/19/2013  Referrals made for care management support none needed

## 2013-04-04 ENCOUNTER — Encounter: Payer: Self-pay | Admitting: Internal Medicine

## 2013-04-04 NOTE — Assessment & Plan Note (Signed)
Tdap during this visit.

## 2013-04-04 NOTE — Assessment & Plan Note (Addendum)
Secondary to OA od bl knees, with varus deformity. No history of falls.  Of note, not DEXA scan on file. Pt does not recall having one or being evaluated for osteoporosis. Current guidelines do not specify age to stop doing DEXA scans of bisphosphonate treatment. Patient  Currently on Calcium and Vitamin D daily and will continue this supplementation.   Discussed with Dr. Dalphine Handing who recommends no DEXA scan or treatment of osteoporosis for patients age 77 and above.

## 2013-04-04 NOTE — Assessment & Plan Note (Signed)
Repeat BMET during next visit with urine micro albumin/cr.

## 2013-04-04 NOTE — Assessment & Plan Note (Signed)
Lab Results  Component Value Date   HGBA1C 6.2 04/03/2013   HGBA1C 6.4 12/05/2012   HGBA1C 6.1 09/09/2012     Assessment: Diabetes control: good control (HgbA1C at goal) Progress toward A1C goal:  unable to assess Comments: Checked Hg A1C during this visit. Diet controlled.  Plan: Medications:  none, continue diet control. Home glucose monitoring: Frequency: no home glucose monitoring Instruction/counseling given: no instruction/counseling  Educational resources provided: brochure Self management tools provided: does not use CBG monitor Other plans: Continue diet control with periodic Hg A1C check

## 2013-04-04 NOTE — Progress Notes (Signed)
  Subjective:    Patient ID: [name], female    DOB: [birth year], 79 y.o.   MRN: [id number]  HPI Ms. Senn if a pleasant 79 year old woman with PMH of CLL, DM2, HTN, and hyperlipidemia who comes in for routine follow up and medication refill. She states that her nosebleeds have completely subsided once she stopped taking ASA 81 mg daily. She denies falls. Her medications needing refills (print out mail order) are Zocor, Aggrenox, and omeprazole. She had no complaints today.    Review of Systems  Constitutional: Negative for fever, appetite change and fatigue.  Respiratory: Negative for cough and shortness of breath.   Cardiovascular: Negative for chest pain, palpitations and leg swelling.  Gastrointestinal: Negative for abdominal pain.  Genitourinary: Negative for dysuria.  Neurological: Negative for dizziness, syncope and light-headedness.       Objective:   Physical Exam  Nursing note and vitals reviewed. Constitutional: She is oriented to person, place, and time. She appears well-developed and well-nourished. No distress.  Eyes: Conjunctivae are normal. No scleral icterus.  Cardiovascular: Normal rate and regular rhythm.   Murmur heard. 3/6 outflow murmur heard best at the RUSB  Pulmonary/Chest: Effort normal and breath sounds normal. No respiratory distress. She has no wheezes. She has no rales. She exhibits no tenderness.  Abdominal: Soft. Bowel sounds are normal.  Musculoskeletal: She exhibits no edema and no tenderness.  Bilateral varus knee  Neurological: She is alert and oriented to person, place, and time. Coordination abnormal.  Skin: Skin is warm and dry. No rash noted. She is not diaphoretic. No erythema. No pallor.  Bruise on anterior medial right leg  Psychiatric: She has a normal mood and affect. Her behavior is normal.          Assessment & Plan:

## 2013-04-04 NOTE — Assessment & Plan Note (Signed)
Printed prescription for Zocor. No complaint of muscle aches that could be 2/2 statin tx.

## 2013-04-04 NOTE — Assessment & Plan Note (Addendum)
BP Readings from Last 3 Encounters:  04/03/13 123/54  01/19/13 126/51  12/05/12 167/65    Lab Results  Component Value Date   NA 140 03/07/2013   K 4.6 03/07/2013   CREATININE 2.0* 03/07/2013    Assessment: Blood pressure control: controlled Progress toward BP goal:  unchanged Comments: assures medication compliance, no dizziness.   Plan: Medications:  continue current medications, Norvasc 10mg , Lasix 40mg , Cozaar 100mg  Educational resources provided: brochure Self management tools provided: home blood pressure logbook Other plans: Printed Rx (mail order) with 3 refills. Repeat BMET next visit, reassess Lasix dose if Cr continues to rise.

## 2013-04-18 ENCOUNTER — Other Ambulatory Visit (HOSPITAL_BASED_OUTPATIENT_CLINIC_OR_DEPARTMENT_OTHER): Payer: Medicare Other | Admitting: Lab

## 2013-04-18 DIAGNOSIS — C9110 Chronic lymphocytic leukemia of B-cell type not having achieved remission: Secondary | ICD-10-CM

## 2013-04-18 LAB — CBC & DIFF AND RETIC
Basophils Absolute: 0 10*3/uL (ref 0.0–0.1)
EOS%: 1.4 % (ref 0.0–7.0)
HGB: 11.1 g/dL — ABNORMAL LOW (ref 11.6–15.9)
MCH: 29.1 pg (ref 25.1–34.0)
MCV: 90.6 fL (ref 79.5–101.0)
MONO%: 4.8 % (ref 0.0–14.0)
RDW: 14 % (ref 11.2–14.5)
Retic Ct Abs: 40.39 10*3/uL (ref 33.70–90.70)

## 2013-04-18 LAB — COMPREHENSIVE METABOLIC PANEL (CC13)
ALT: 14 U/L (ref 0–55)
AST: 19 U/L (ref 5–34)
Alkaline Phosphatase: 92 U/L (ref 40–150)
BUN: 34.8 mg/dL — ABNORMAL HIGH (ref 7.0–26.0)
Calcium: 9.8 mg/dL (ref 8.4–10.4)
Chloride: 103 mEq/L (ref 98–107)
Creatinine: 2.3 mg/dL — ABNORMAL HIGH (ref 0.6–1.1)

## 2013-05-30 ENCOUNTER — Other Ambulatory Visit: Payer: Medicare Other | Admitting: Lab

## 2013-06-01 ENCOUNTER — Telehealth: Payer: Self-pay | Admitting: *Deleted

## 2013-06-01 ENCOUNTER — Ambulatory Visit (HOSPITAL_BASED_OUTPATIENT_CLINIC_OR_DEPARTMENT_OTHER): Payer: Medicare Other | Admitting: Oncology

## 2013-06-01 ENCOUNTER — Other Ambulatory Visit: Payer: Self-pay | Admitting: *Deleted

## 2013-06-01 ENCOUNTER — Other Ambulatory Visit (HOSPITAL_BASED_OUTPATIENT_CLINIC_OR_DEPARTMENT_OTHER): Payer: Medicare Other | Admitting: Lab

## 2013-06-01 VITALS — BP 157/62 | HR 102 | Temp 98.3°F | Resp 20 | Ht <= 58 in | Wt 138.3 lb

## 2013-06-01 DIAGNOSIS — C9110 Chronic lymphocytic leukemia of B-cell type not having achieved remission: Secondary | ICD-10-CM

## 2013-06-01 LAB — CBC & DIFF AND RETIC
Basophils Absolute: 0.1 10*3/uL (ref 0.0–0.1)
Eosinophils Absolute: 0.3 10*3/uL (ref 0.0–0.5)
HCT: 34.3 % — ABNORMAL LOW (ref 34.8–46.6)
HGB: 11.3 g/dL — ABNORMAL LOW (ref 11.6–15.9)
Immature Retic Fract: 10.9 % — ABNORMAL HIGH (ref 1.60–10.00)
MONO#: 2.3 10*3/uL — ABNORMAL HIGH (ref 0.1–0.9)
NEUT#: 7.1 10*3/uL — ABNORMAL HIGH (ref 1.5–6.5)
NEUT%: 26.6 % — ABNORMAL LOW (ref 38.4–76.8)
Retic Ct Abs: 59.21 10*3/uL (ref 33.70–90.70)
WBC: 26.6 10*3/uL — ABNORMAL HIGH (ref 3.9–10.3)
lymph#: 16.9 10*3/uL — ABNORMAL HIGH (ref 0.9–3.3)

## 2013-06-01 LAB — COMPREHENSIVE METABOLIC PANEL (CC13)
AST: 20 U/L (ref 5–34)
BUN: 31 mg/dL — ABNORMAL HIGH (ref 7.0–26.0)
CO2: 24 mEq/L (ref 22–29)
Calcium: 9.8 mg/dL (ref 8.4–10.4)
Chloride: 103 mEq/L (ref 98–109)
Creatinine: 2.1 mg/dL — ABNORMAL HIGH (ref 0.6–1.1)

## 2013-06-01 NOTE — Progress Notes (Signed)
ID: [name]   DOB: 19-Oct-1921  MR#: [id number]  CSN#:[id number]  PCP: Ky Barban, MD GYN: SU:  OTHER MD: Jene Every    INTERVAL HISTORY: [name] returns today with her husband [name] for followup of her chronic lymphoid leukemia. The interval history is significant for [name] continuing to feel very depressed. Last visit I suggested the right a diary. This visit I gave him information on the courses on [place] language and culture at [facility] [facility]. Possibly he could get some students to come once a week just to practice the language.  REVIEW OF SYSTEMS: [name] herself is doing well except for her knees. They hurt sufficiently that she takes an  Ultracet at night to help her sleep, and she uses Tylenol sometimes during the day. She has some stress urinary incontinence, but otherwise a detailed review of systems today was stable and in particular she denies any fevers, rash, bleeding, dysphagia ring adenopathy, unexplained weight loss or unexplained fatigue.  PAST MEDICAL HISTORY: Past Medical History  Diagnosis Date  . Diabetes mellitus   . Hyperlipidemia   . Hypertension   . Carotid stenosis, right 08/2005    40-60%  . History of colonic polyps 2002    Normal colonoscopy 08/2004  . CKD (chronic kidney disease) stage 2, GFR 60-89 ml/min   . CLL (chronic lymphocytic leukemia)     Dr. Darnelle Catalan. s/p Rituxan  . Pulse irregularity     Etiology->frequent PACs, normal EKG    FAMILY HISTORY No family history on file.  GYNECOLOGIC HISTORY: GX P0  SOCIAL HISTORY: She is originally from [place]. 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 They have no children. They are Catholic.  ADVANCED DIRECTIVES: in place  HEALTH MAINTENANCE: History  Substance Use Topics  . Smoking status: Former Games developer  . Smokeless tobacco: Not on file  . Alcohol Use: Not on file     Colonoscopy: Buccini  PAP:  Bone density:  Lipid panel:  No Known Allergies  Current Outpatient  Prescriptions  Medication Sig Dispense Refill  . acetaminophen (TYLENOL) 500 MG tablet Take 1,000 mg by mouth 2 (two) times daily as needed. For pain      . amLODipine (NORVASC) 10 MG tablet Take 1 tablet (10 mg total) by mouth daily.  100 tablet  3  . Calcium Carbonate-Vitamin D (CALCIUM PLUS VITAMIN D PO) Take 1 tablet by mouth 2 (two) times daily.      . Cranberry 450 MG CAPS Take 1 capsule by mouth daily.      [name] dipyridamole-aspirin (AGGRENOX) 200-25 MG per 12 hr capsule Take 1 capsule by mouth 2 (two) times daily.  200 capsule  3  . furosemide (LASIX) 40 MG tablet Take 1 tablet (40 mg total) by mouth daily.  200 tablet  3  . losartan (COZAAR) 100 MG tablet Take 1 tablet (100 mg total) by mouth daily.  100 tablet  3  . Multiple Vitamins-Minerals (MACULAR VITAMIN BENEFIT PO) Take 1 tablet by mouth 2 (two) times daily.      . Multiple Vitamins-Minerals (MULTIVITAMINS THER. W/MINERALS) TABS Take 1 tablet by mouth daily.      . Omega-3 Fatty Acids (FISH OIL) 1200 MG CAPS Take 1 capsule by mouth daily.      [name] omeprazole (PRILOSEC) 20 MG capsule Take 1 capsule (20 mg total) by mouth daily.  100 capsule  3  . Polyethyl Glycol-Propyl Glycol (SYSTANE OP) Place 1 drop into both eyes 2 (two) times  daily.      . riTUXimab (RITUXAN) 10 MG/ML injection Administered by Dr. Darnelle Catalan       . simvastatin (ZOCOR) 20 MG tablet Take 1 tablet (20 mg total) by mouth at bedtime.  100 tablet  3  . sodium chloride (OCEAN NASAL SPRAY) 0.65 % nasal spray Place 1 spray into the nose as needed for congestion (You may use it 2-3 times per day.).  45 mL  3  . tramadol-acetaminophen (ULTRACET) 37.5-325 MG per tablet Take 1-2 tablets by mouth every 6 (six) hours as needed.         No current facility-administered medications for this visit.    OBJECTIVE: Elderly white woman in no acute distress Filed Vitals:   06/01/13 1433  BP: 157/62  Pulse: 102  Temp: 98.3 F (36.8 Hampton)  Resp: 20     Body mass index is 28.91  kg/(m^2).    ECOG FS: 2  Sclerae unicteric Oropharynx clear No cervical, supraclavicular, or axillary adenopathy Lungs no rales or rhonchi Heart regular rate and rhythm Abd benign, no splenomegaly noted MSK no focal spinal tenderness, no peripheral edema; fair range of motion bilateral upper extremities; no obvious erythema or swelling either knee Neuro: nonfocal, well oriented, pleasant affect   LAB RESULTS: Lab Results  Component Value Date   WBC 22.6* 04/18/2013   NEUTROABS 5.2 04/18/2013   HGB 11.1* 04/18/2013   HCT 34.5* 04/18/2013   MCV 90.6 04/18/2013   PLT 304 04/18/2013      Chemistry      Component Value Date/Time   NA 142 04/18/2013 1422   NA 142 12/05/2012 1520   K 4.1 04/18/2013 1422   K 5.4* 12/05/2012 1520   CL 103 04/18/2013 1422   CL 103 12/05/2012 1520   CO2 26 04/18/2013 1422   CO2 25 12/05/2012 1520   BUN 34.8* 04/18/2013 1422   BUN 34* 12/05/2012 1520   CREATININE 2.3* 04/18/2013 1422   CREATININE 1.85* 12/05/2012 1520   CREATININE 1.89* 06/28/2012 1425      Component Value Date/Time   CALCIUM 9.8 04/18/2013 1422   CALCIUM 10.5 12/05/2012 1520   CALCIUM 10.5 08/07/2008 0000   ALKPHOS 92 04/18/2013 1422   ALKPHOS 77 06/28/2012 1425   AST 19 04/18/2013 1422   AST 15 06/28/2012 1425   ALT 14 04/18/2013 1422   ALT 10 06/28/2012 1425   BILITOT 0.38 04/18/2013 1422   BILITOT 0.4 06/28/2012 1425       No results found for this basename: LABCA2    No components found with this basename: LABCA125    No results found for this basename: INR,  in the last 168 hours  Urinalysis No results found for this basename: colorurine,  appearanceur,  labspec,  phurine,  glucoseu,  hgbur,  bilirubinur,  ketonesur,  proteinur,  urobilinogen,  nitrite,  leukocytesur    STUDIES: No results found.   ASSESSMENT: 79 y.o.  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   Status post treatment with Rituxan in 2004 and 2007, with no treatment after November 2007.   (2) anemia, consistent with anemia of chronic renal injury       PLAN: From the point of view of her chronic lymphoid leukemia, [name] is doing fantastic. She does not need any intervention at this point. We will continue to follow her counts every 6 weeks and do visits once a year.  I wonder if some minor intervention might help her knee pain. I am referring her to orthopedics with this in mind.  Otherwise I am more concerned about [name] and his depression. I think he needs to get out of the house more, and make use of the talents he still has in particular his knowledge of [place] language and literature.   [name]    06/01/2013

## 2013-06-01 NOTE — Telephone Encounter (Signed)
Tried to call the pt and inform her that [facility] gave the wrong doctor for her to see for knee pain. He meant to place Dr. Leandra Kern. i made an appt for her to see Dr. Shelle Iron on 06/13/13 @2 :45pm. i will mail a letter along with Dr. Shelle Iron info so that the pt can attend her appt...td

## 2013-06-01 NOTE — Telephone Encounter (Signed)
appts made and printed. i called Dr. Tyrone Apple office and sw Feliz Beam she will be faxing a form to fill out, and when i fax it back the scheduler will call the pt and schedule her an appt. i aslo gv pt the info to call them due to it not relate to the ov...td

## 2013-06-19 ENCOUNTER — Encounter: Payer: Self-pay | Admitting: Internal Medicine

## 2013-06-19 ENCOUNTER — Encounter: Payer: Medicare Other | Admitting: Internal Medicine

## 2013-06-19 ENCOUNTER — Ambulatory Visit (INDEPENDENT_AMBULATORY_CARE_PROVIDER_SITE_OTHER): Payer: Medicare Other | Admitting: Internal Medicine

## 2013-06-19 VITALS — BP 184/80 | HR 76 | Temp 97.8°F | Ht <= 58 in | Wt 139.7 lb

## 2013-06-19 DIAGNOSIS — E119 Type 2 diabetes mellitus without complications: Secondary | ICD-10-CM

## 2013-06-19 DIAGNOSIS — M1710 Unilateral primary osteoarthritis, unspecified knee: Secondary | ICD-10-CM

## 2013-06-19 DIAGNOSIS — E785 Hyperlipidemia, unspecified: Secondary | ICD-10-CM

## 2013-06-19 DIAGNOSIS — C9110 Chronic lymphocytic leukemia of B-cell type not having achieved remission: Secondary | ICD-10-CM

## 2013-06-19 DIAGNOSIS — D539 Nutritional anemia, unspecified: Secondary | ICD-10-CM

## 2013-06-19 DIAGNOSIS — I10 Essential (primary) hypertension: Secondary | ICD-10-CM

## 2013-06-19 LAB — GLUCOSE, CAPILLARY: Glucose-Capillary: 136 mg/dL — ABNORMAL HIGH (ref 70–99)

## 2013-06-19 MED ORDER — HYDROCHLOROTHIAZIDE 12.5 MG PO CAPS: 12.5000 mg | ORAL_CAPSULE | Freq: Every day | ORAL | Status: DC

## 2013-06-19 NOTE — Patient Instructions (Addendum)
General Instructions: -Start taking Hydrochlorothiazide for your blood pressure.  -Return in 2 weeks for blood pressure recheck.  -Foi bom ver a senhora [name] vez!   Treatment Goals:  Goals (1 Years of Data) as of 06/19/13         As of Today As of Today 06/01/13 04/03/13 01/19/13     Blood Pressure    . Blood Pressure < 140/90  184/80 169/63 157/62 123/54 126/51     Result Component    . HEMOGLOBIN A1C < 7.0     6.2     . LDL CALC < 100            Progress Toward Treatment Goals:  Treatment Goal 06/19/2013  Hemoglobin A1C at goal  Blood pressure deteriorated    Self Care Goals & Plans:  Self Care Goal 06/19/2013  Manage my medications take my medicines as prescribed; bring my medications to every visit  Monitor my health -  Eat healthy foods eat foods that are low in salt  Be physically active -    Home Blood Glucose Monitoring 06/19/2013  Check my blood sugar no home glucose monitoring     Care Management & Community Referrals:  Referral 06/19/2013  Referrals made for care management support none needed  Referrals made to community resources none

## 2013-06-20 LAB — LIPID PANEL
Cholesterol: 172 mg/dL (ref 0–200)
HDL: 37 mg/dL — ABNORMAL LOW (ref >39)
Total CHOL/HDL Ratio: 4.6 Ratio

## 2013-06-20 MED ORDER — SPIRONOLACTONE 25 MG PO TABS: 25.0000 mg | ORAL_TABLET | Freq: Every day | ORAL | Status: DC

## 2013-06-20 NOTE — Assessment & Plan Note (Signed)
Hg stable per recent labs. She is followed by Dr. Darnelle Catalan, her Oncologist recently for CLL monitoring with no indication for tx at this time.

## 2013-06-20 NOTE — Assessment & Plan Note (Signed)
Stable per recent evaluation by her Oncologist.

## 2013-06-20 NOTE — Assessment & Plan Note (Signed)
Checked lipid panel during this visit. Triglycerides mildly elevated, HDL mildly low, LDL >100. No SE with statin use, continue Zocor.

## 2013-06-20 NOTE — Assessment & Plan Note (Addendum)
She states that she was recently evaluated by her Orthopedic Surgeon with bl knee plain film and the decision to continue conservative management with Tylenol PRN and Ultracet PRN for her knee pain. She declines referral to Physical Therapy for evaluation of gait. She has no hx of falls but states that will let us know if she wants PT eval.

## 2013-06-20 NOTE — Progress Notes (Signed)
  Subjective:    Patient ID: [name], female    DOB: 1921/10/25, 79 y.o.   MRN: [id number]  HPI [name] is a pleasant 79 year old woman with PMH of CLL, HTN, DM2, osteoarthritis who comes in for routine follow up visit with no acute complaints.    Review of Systems  Constitutional: Negative for fever, chills, diaphoresis, activity change and appetite change.  Respiratory: Negative for cough, chest tightness and shortness of breath.   Cardiovascular: Negative for chest pain, palpitations and leg swelling.  Gastrointestinal: Negative for abdominal pain.  Genitourinary: Negative for dysuria.  Musculoskeletal: Positive for arthralgias. Negative for joint swelling.       Chronic bilateral knee pain, R>L  Skin: Negative for rash and wound.  Neurological: Negative for dizziness, speech difficulty and headaches.  Psychiatric/Behavioral: Negative for behavioral problems and agitation.       Objective:   Physical Exam  Nursing note and vitals reviewed. Constitutional: She appears well-developed and well-nourished.  HENT:  Head: Normocephalic.  Eyes: Conjunctivae are normal. No scleral icterus.  Neck: Neck supple.  Cardiovascular: Normal rate and regular rhythm.   Pulmonary/Chest: Effort normal and breath sounds normal. No respiratory distress. She has no wheezes. She has no rales.  Abdominal: Soft.  Musculoskeletal: She exhibits no edema and no tenderness.  Varus bl knee deformity. Bl knees with no palpable effusion, no TTP, no edema or erythema.   Neurological: She is alert. She exhibits normal muscle tone. Coordination abnormal.  Oriented to place and time (did not know the names of the presidents of the [place] or [place]). Mini cog word recall 1/3 but clock drawing normal.   Skin: Skin is warm and dry. No rash noted. No erythema.  Psychiatric: She has a normal mood and affect. Her behavior is normal.          Assessment & Plan:

## 2013-06-20 NOTE — Assessment & Plan Note (Signed)
Lab Results  Component Value Date   HGBA1C 6.2 04/03/2013   HGBA1C 6.4 12/05/2012   HGBA1C 6.1 09/09/2012     Assessment: Diabetes control: good control (HgbA1C at goal) Progress toward A1C goal:  at goal Comments: Diet controlled  Plan: Medications:  continue diet control Home glucose monitoring: Frequency: no home glucose monitoring Timing:   Instruction/counseling given: discussed foot care Educational resources provided: brochure Self management tools provided:   Other plans: Recheck Hg A1C during next visit. Lipid panel check during this visit. Continue statin and ARB. DM foot exam during this visit.

## 2013-06-20 NOTE — Assessment & Plan Note (Signed)
BP Readings from Last 3 Encounters:  06/19/13 184/80  06/01/13 157/62  04/03/13 123/54    Lab Results  Component Value Date   NA 141 06/01/2013   K 3.6 06/01/2013   CREATININE 2.1* 06/01/2013    Assessment: Blood pressure control: moderately elevated Progress toward BP goal:  deteriorated Comments: She assures compliance with Norvasc 10mg  daily, Losartan 100mg  daily, and Lasix 40mg  daily. No pedal edema or pulmonary rales on physical exam. No new medications or herbal supplements.   Plan: Medications:  continue current medications Educational resources provided:  None Self management tools provided: home blood pressure logbook Other plans: Initially added HCTZ 12.5mg  but given the possibility of marked increased diuresis of HCTZ and Lasix, HCTZ was discontinued. Spironolactone 25mg  daily was prescribed electronically and sent to her pharmacy. The patient was called at home and instructed to discard her Rx for HCTZ and start taking spironolactone. She agreed and understood.  Will check BMET during her next visit.

## 2013-06-21 NOTE — Progress Notes (Signed)
Case discussed with Dr. Kennerly (at time of visit, soon after the resident saw the patient).  We reviewed the resident's history and exam and pertinent patient test results.  I agree with the assessment, diagnosis, and plan of care documented in the resident's note.  

## 2013-07-03 ENCOUNTER — Encounter: Payer: Self-pay | Admitting: Internal Medicine

## 2013-07-03 ENCOUNTER — Ambulatory Visit (INDEPENDENT_AMBULATORY_CARE_PROVIDER_SITE_OTHER): Payer: Medicare Other | Admitting: Internal Medicine

## 2013-07-03 VITALS — BP 176/68 | HR 75 | Temp 98.3°F | Wt 141.1 lb

## 2013-07-03 DIAGNOSIS — E119 Type 2 diabetes mellitus without complications: Secondary | ICD-10-CM

## 2013-07-03 DIAGNOSIS — M1710 Unilateral primary osteoarthritis, unspecified knee: Secondary | ICD-10-CM

## 2013-07-03 DIAGNOSIS — I10 Essential (primary) hypertension: Secondary | ICD-10-CM

## 2013-07-03 MED ORDER — AMLODIPINE BESYLATE 10 MG PO TABS: 10.0000 mg | ORAL_TABLET | Freq: Every day | ORAL | Status: DC

## 2013-07-03 MED ORDER — LOSARTAN POTASSIUM 100 MG PO TABS: 100.0000 mg | ORAL_TABLET | Freq: Every day | ORAL | Status: DC

## 2013-07-03 MED ORDER — TRAMADOL-ACETAMINOPHEN 37.5-325 MG PO TABS: 1.0000 | ORAL_TABLET | Freq: Four times a day (QID) | ORAL | Status: DC | PRN

## 2013-07-03 NOTE — Patient Instructions (Addendum)
General Instructions: -Continue taking all your medications as prescribed, including Cozaar and spironolactone.  -Come back in 3-5 weeks for follow up and repeat labs.  -Let us know if you are interested in physical therapy or in nurse visits for blood pressure checks.  -Foi muito bom ver a senhora Minerva Areola vez!   Treatment Goals:  Goals (1 Years of Data) as of 07/03/13         As of Today 06/19/13 06/19/13 06/01/13 04/03/13     Blood Pressure    . Blood Pressure < 140/90  176/68 184/80 169/63 157/62 123/54     Result Component    . HEMOGLOBIN A1C < 7.0      6.2    . LDL CALC < 100   99         Progress Toward Treatment Goals:  Treatment Goal 07/03/2013  Hemoglobin A1C at goal  Blood pressure unchanged    Self Care Goals & Plans:  Self Care Goal 07/03/2013  Manage my medications refill my medications on time  Monitor my health -  Eat healthy foods eat foods that are low in salt  Be physically active -  Meeting treatment goals maintain the current self-care plan    Home Blood Glucose Monitoring 07/03/2013  Check my blood sugar no home glucose monitoring     Care Management & Community Referrals:  Referral 06/19/2013  Referrals made for care management support none needed  Referrals made to community resources none

## 2013-07-04 NOTE — Assessment & Plan Note (Signed)
BP Readings from Last 3 Encounters:  07/03/13 176/68  06/19/13 184/80  06/01/13 157/62    Lab Results  Component Value Date   NA 141 06/01/2013   K 3.6 06/01/2013   CREATININE 2.1* 06/01/2013    Assessment: Blood pressure control: mildly elevated Progress toward BP goal:  unchanged Comments: She started taking spironolactone 25mg  daily as prescribed after her last visit but forgot to take Cozaar. Her BP is elevated during this visit.   Plan: Medications:  continue current medications. Norvasc 10mg  daily, Lasix 40mg  daily. Cozaar 100mg  daily, and spironolactone 25mg  daily.  Educational resources provided:   Self management tools provided:   Other plans: Follow up in 3-5 weeks for BP recheck, will consider BMET for K trending.

## 2013-07-04 NOTE — Progress Notes (Signed)
  Subjective:    Patient ID: [name], female    DOB: 08-06-1921, 79 y.o.   MRN: [id number]  HPI Ms. Vantassell is a 79 year old woman with PMH of CLL, HTN, DM2, CKD who comes in for follow up visit for her HTN. She states that she has started taking spironolactone 25mg  daily as prescribed after her office visit but she forgotten to take Cozaar for days prior to this visit. She explains that she has been more forgetful in the past months. She does not have Home Health services and declines any offer for such services at this time.    Review of Systems  Constitutional: Negative for fever, chills, appetite change and fatigue.  Respiratory: Negative for cough and shortness of breath.   Cardiovascular: Negative for chest pain, palpitations and leg swelling.  Genitourinary: Negative for dysuria and frequency.  Musculoskeletal: Positive for arthralgias and gait problem.       Chronic right knee pain  Skin: Negative for color change, pallor, rash and wound.  Neurological: Negative for dizziness, syncope, speech difficulty, numbness and headaches.  Hematological: Does not bruise/bleed easily.  Psychiatric/Behavioral: Negative for agitation.       Forgetfulness        Objective:   Physical Exam  Nursing note and vitals reviewed. Constitutional: She is oriented to person, place, and time. She appears well-developed and well-nourished. No distress.  HENT:  Head: Normocephalic and atraumatic.  Wearing glasses   Eyes: Conjunctivae are normal. No scleral icterus.  Cardiovascular: Normal rate and regular rhythm.   Pulmonary/Chest: Effort normal. No respiratory distress.  Musculoskeletal: She exhibits no edema and no tenderness.  No right knee joint effusion, warmth, or erythema.   Neurological: She is alert and oriented to person, place, and time. Coordination abnormal.  Skin: Skin is warm and dry. No rash noted. She is not diaphoretic. No erythema. No pallor.  Psychiatric: She has a  normal mood and affect. Her behavior is normal.          Assessment & Plan:

## 2013-07-05 NOTE — Progress Notes (Signed)
Case discussed with Dr. Kennerly soon after the resident saw the patient.  We reviewed the resident's history and exam and pertinent patient test results.  I agree with the assessment, diagnosis, and plan of care documented in the resident's note. 

## 2013-07-11 ENCOUNTER — Other Ambulatory Visit (HOSPITAL_BASED_OUTPATIENT_CLINIC_OR_DEPARTMENT_OTHER): Payer: Medicare Other | Admitting: Lab

## 2013-07-11 DIAGNOSIS — C9110 Chronic lymphocytic leukemia of B-cell type not having achieved remission: Secondary | ICD-10-CM

## 2013-07-11 LAB — COMPREHENSIVE METABOLIC PANEL (CC13)
ALT: 13 U/L (ref 0–55)
AST: 18 U/L (ref 5–34)
CO2: 24 mEq/L (ref 22–29)
Creatinine: 2.2 mg/dL — ABNORMAL HIGH (ref 0.6–1.1)
Total Bilirubin: 0.54 mg/dL (ref 0.20–1.20)

## 2013-07-11 LAB — CBC WITH DIFFERENTIAL/PLATELET
BASO%: 1.1 % (ref 0.0–2.0)
Basophils Absolute: 0.3 10*3/uL — ABNORMAL HIGH (ref 0.0–0.1)
HCT: 32.6 % — ABNORMAL LOW (ref 34.8–46.6)
HGB: 10.9 g/dL — ABNORMAL LOW (ref 11.6–15.9)
LYMPH%: 72.1 % — ABNORMAL HIGH (ref 14.0–49.7)
MCH: 30.4 pg (ref 25.1–34.0)
MCHC: 33.3 g/dL (ref 31.5–36.0)
MONO#: 1.5 10*3/uL — ABNORMAL HIGH (ref 0.1–0.9)
NEUT%: 20.1 % — ABNORMAL LOW (ref 38.4–76.8)
Platelets: 354 10*3/uL (ref 145–400)
WBC: 27.5 10*3/uL — ABNORMAL HIGH (ref 3.9–10.3)
lymph#: 19.9 10*3/uL — ABNORMAL HIGH (ref 0.9–3.3)

## 2013-07-11 LAB — TECHNOLOGIST REVIEW

## 2013-08-21 ENCOUNTER — Encounter: Payer: Self-pay | Admitting: Internal Medicine

## 2013-08-21 ENCOUNTER — Ambulatory Visit (INDEPENDENT_AMBULATORY_CARE_PROVIDER_SITE_OTHER): Payer: Medicare Other | Admitting: Internal Medicine

## 2013-08-21 VITALS — BP 173/69 | HR 87 | Temp 97.9°F | Wt 137.4 lb

## 2013-08-21 DIAGNOSIS — N183 Chronic kidney disease, stage 3 (moderate): Secondary | ICD-10-CM

## 2013-08-21 DIAGNOSIS — R269 Unspecified abnormalities of gait and mobility: Secondary | ICD-10-CM

## 2013-08-21 DIAGNOSIS — Z23 Encounter for immunization: Secondary | ICD-10-CM

## 2013-08-21 DIAGNOSIS — R2681 Unsteadiness on feet: Secondary | ICD-10-CM

## 2013-08-21 DIAGNOSIS — I10 Essential (primary) hypertension: Secondary | ICD-10-CM

## 2013-08-21 DIAGNOSIS — E785 Hyperlipidemia, unspecified: Secondary | ICD-10-CM

## 2013-08-21 DIAGNOSIS — E119 Type 2 diabetes mellitus without complications: Secondary | ICD-10-CM

## 2013-08-21 DIAGNOSIS — R279 Unspecified lack of coordination: Secondary | ICD-10-CM

## 2013-08-21 DIAGNOSIS — IMO0002 Reserved for concepts with insufficient information to code with codable children: Secondary | ICD-10-CM

## 2013-08-21 DIAGNOSIS — N189 Chronic kidney disease, unspecified: Secondary | ICD-10-CM

## 2013-08-21 LAB — BASIC METABOLIC PANEL WITH GFR
CO2: 25 mEq/L (ref 19–32)
Chloride: 95 mEq/L — ABNORMAL LOW (ref 96–112)
Creat: 2.06 mg/dL — ABNORMAL HIGH (ref 0.50–1.10)

## 2013-08-21 LAB — POCT GLYCOSYLATED HEMOGLOBIN (HGB A1C): Hemoglobin A1C: 6.4

## 2013-08-21 NOTE — Patient Instructions (Addendum)
General Instructions: -Stop taking Hydrochlorothiazide -Stop taking Spironolactone -Start taking Cozaar everyday  -You have been referred to Physical Therapy, they will call you with the details of the appointment. This is to help prevent falls.  -Follow up with [place] next week, on Monday, 10/6.   Treatment Goals:  Goals (1 Years of Data) as of 08/21/13         As of Today 07/03/13 06/19/13 06/19/13 06/01/13     Blood Pressure    . Blood Pressure < 140/90  173/69 176/68 184/80 169/63 157/62     Result Component    . HEMOGLOBIN A1C < 7.0  6.4        . LDL CALC < 100    99        Progress Toward Treatment Goals:  Treatment Goal 08/21/2013  Hemoglobin A1C at goal  Blood pressure unchanged    Self Care Goals & Plans:  Self Care Goal 08/21/2013  Manage my medications take my medicines as prescribed; bring my medications to every visit; refill my medications on time  Monitor my health -  Eat healthy foods eat foods that are low in salt  Be physically active find workout friends  Meeting treatment goals -    Home Blood Glucose Monitoring 08/21/2013  Check my blood sugar no home glucose monitoring     Care Management & Community Referrals:  Referral 08/21/2013  Referrals made for care management support (No Data)  Referrals made to community resources falls prevention

## 2013-08-21 NOTE — Assessment & Plan Note (Addendum)
Creatinine close to her baseline, K stable at 4.6.  She received her flu vaccine today.

## 2013-08-21 NOTE — Assessment & Plan Note (Signed)
Will continue same dose of Zocor for now to prevent medication confusion as her BP medications needed adjustment during this visit.

## 2013-08-21 NOTE — Assessment & Plan Note (Signed)
Lab Results  Component Value Date   HGBA1C 6.4 08/21/2013   HGBA1C 6.2 04/03/2013   HGBA1C 6.4 12/05/2012     Assessment: Diabetes control: good control (HgbA1C at goal) Progress toward A1C goal:  at goal Comments: She is on diet control alone  Plan: Medications:  continue diet control Home glucose monitoring: Frequency: no home glucose monitoring Timing:   Instruction/counseling given: reminded to bring medications to each visit Educational resources provided:   Self management tools provided:   Other plans: Will continue statin, BP control, will check Hg A1C in 3 months.

## 2013-08-21 NOTE — Assessment & Plan Note (Addendum)
She denies falls but certainly is a risk for falls. She does not use a cane or walker at home.  -NeuroRehab referral   Addendum: Helio, the patient's husband agreed to [facility] services. Ordered HH PT and RN for BP and medication management. Will consider referral to [facility] as well.

## 2013-08-21 NOTE — Progress Notes (Signed)
Subjective:    Patient ID: [name], female    DOB: August 26, 1921, 79 y.o.   MRN: [id number]  HPI  [name] is a pleasant 79 year old [place] woman originally from [place] with PMH significant for CLL, HTN, CKD, and DM2, and osteoarthritis who presents for follow up visit for her blood pressure. She explains that she has been out of Cozaar, she called the Pharmacy and they told her that she could not have this medication until early next month as her last dispense was for a 3 month supply.  She states that she feels well overall with no headache, blurry vision, chest pain, or leg edema. She has not brought all her medications with her but has a bottle of hydrochlorothiazide and spironolactone and states that she has been taking both. She had forgotten that she was told not to take the HCTZ and has only a few pills left in her bottle.  She denies falls, stating that her gait is a little unsteady but she is always carefull going up or down the 4-5 steps to get to her house.  Per her report, her husband does not like to make friends and does not like strangers in her house so they do not have friends in [place] or in the [place]. They used to have a neighbor who would check on them if she did not see them outside for 1-2 days but this neighbor moved away and sold her house which is currently empty.  They do not have family in the [place] either, she has a brother in [place] named Heitor Godoio [place] but he is in his 77's and she does not call him very often.   Review of Systems  Constitutional: Negative for fever, chills, diaphoresis, activity change, appetite change and fatigue.  Respiratory: Positive for cough. Negative for shortness of breath and wheezing.        She has occasional cough at night that improves with Ricola lozenge   Cardiovascular: Negative for chest pain, palpitations and leg swelling.  Gastrointestinal: Negative for abdominal pain.  Genitourinary: Positive for dysuria.  Negative for flank pain, vaginal bleeding and difficulty urinating.       Dysuria that comes and goes and improves with Cranberry supplement.   Musculoskeletal: Positive for arthralgias and gait problem. Negative for back pain.       Right knee pain that comes and goes  Neurological: Negative for dizziness, weakness, light-headedness and headaches.  Hematological: Bruises/bleeds easily.  Psychiatric/Behavioral: Negative for behavioral problems, confusion and agitation.       Objective:   Physical Exam  Nursing note and vitals reviewed. Constitutional: She is oriented to person, place, and time. She appears well-developed and well-nourished. No distress.  HENT:  Head: Atraumatic.  Eyes: Conjunctivae are normal. No scleral icterus.  Cardiovascular: Normal rate and regular rhythm.   Pulmonary/Chest: Effort normal and breath sounds normal. No respiratory distress. She has no wheezes. She has no rales. She exhibits no tenderness.  Abdominal: Soft. She exhibits no distension.  Musculoskeletal: She exhibits no edema and no tenderness.  Strength 5/5 for triceps, biceps bilaterally. Grip strength 5/5 for the right hand and 4/5 for the left hand. Distal forearm with bone deformity secondary to healed injury.  Quadriceps, hamstrings, abductors, adductors, dorsal flexion, plantar flexion, 5/5 bilaterally.  Wide unsteady gait with valgus deformity of the bilateral knees.  Right knee with no edema, erythema, tenderness, or increased warmth.  Neurological: She is alert and oriented to person, place,  and time. She has normal reflexes. She displays normal reflexes. No cranial nerve deficit. She exhibits normal muscle tone. Coordination abnormal.  Mini mental status with normal clock and 3/3 word recall.  Skin: Skin is warm and dry. No rash noted. She is not diaphoretic. No erythema. No pallor.  Psychiatric: She has a normal mood and affect. Her behavior is normal.          Assessment & Plan:

## 2013-08-21 NOTE — Assessment & Plan Note (Addendum)
BP Readings from Last 3 Encounters:  08/21/13 173/69  07/03/13 176/68  06/19/13 184/80    Lab Results  Component Value Date   NA 136 08/21/2013   K 4.6 08/21/2013   CREATININE 2.06* 08/21/2013    Assessment: Blood pressure control: moderately elevated Progress toward BP goal:  unchanged Comments: She is on amlodipine 10mg  daily, Lasix 40mg  daily. She was supposed to take Cozaar 100mg  daily but tells me that she has been out of this medication since July. I called [facility] and they reported that this medication was dispensed and filled on July 6th. The patient has been taking HCTZ 12.5 mg and spironolactone 25 mg since I last saw her.   Plan: Medications:  Continue amlodipine, Lasix, restart Cozaar, dicontinue spironolactone and HCTZ.  Educational resources provided:   Self management tools provided: other (see comments) (Pt declines HH RN for BP checks) Other plans: We both crossed the labels on the medication bottles of HCTZ and Spironolactone and wrote the word "nao" meaning "no" in [place]. I called the refill at her Pharmacy which will be ready for pick up after this visit.    We had an extensive discussion in regards to referral to [facility], [facility] RN, for medication assistance but she adamantly refuses such referral stating that her husband does not like strangers in their house that this would only cause problems for her. She states that her husband purchased a BP monitor, it is in their home but she does not want to use it; she prefers to come to the [facility] once per week as needed for BP monitoring.  Checked BMET during this visit for K trending. Would avoid spironolactone with ARB for now given her CKD. Would consider adding beta blocker or increasing the dose of Lasix if she needs increase in anti-hypertensive and diuresis.

## 2013-08-21 NOTE — Assessment & Plan Note (Signed)
Her right knee is not painful today, the pain is well controlled with Ultram PRN.

## 2013-08-22 ENCOUNTER — Other Ambulatory Visit (HOSPITAL_BASED_OUTPATIENT_CLINIC_OR_DEPARTMENT_OTHER): Payer: Medicare Other | Admitting: Lab

## 2013-08-22 DIAGNOSIS — C9110 Chronic lymphocytic leukemia of B-cell type not having achieved remission: Secondary | ICD-10-CM

## 2013-08-22 LAB — CBC WITH DIFFERENTIAL/PLATELET
Basophils Absolute: 0.3 10*3/uL — ABNORMAL HIGH (ref 0.0–0.1)
Eosinophils Absolute: 0.3 10*3/uL (ref 0.0–0.5)
HGB: 11 g/dL — ABNORMAL LOW (ref 11.6–15.9)
MCV: 92.8 fL (ref 79.5–101.0)
MONO#: 1.4 10*3/uL — ABNORMAL HIGH (ref 0.1–0.9)
NEUT#: 6.5 10*3/uL (ref 1.5–6.5)
RDW: 14 % (ref 11.2–14.5)
lymph#: 19.8 10*3/uL — ABNORMAL HIGH (ref 0.9–3.3)

## 2013-08-23 ENCOUNTER — Telehealth: Payer: Self-pay | Admitting: *Deleted

## 2013-08-23 NOTE — Addendum Note (Signed)
Addended by: Sara Chu D on: 08/23/2013 01:32 PM   Modules accepted: Orders

## 2013-08-23 NOTE — Telephone Encounter (Signed)
Referral faxed to [facility] for [facility] PT and RN to help with medication and assess pt for other needs.  Phone call complete.Kingsley Spittle Cassady10/1/20144:40 PM

## 2013-08-28 ENCOUNTER — Encounter: Payer: Self-pay | Admitting: Internal Medicine

## 2013-08-28 ENCOUNTER — Ambulatory Visit (INDEPENDENT_AMBULATORY_CARE_PROVIDER_SITE_OTHER): Payer: Medicare Other | Admitting: Internal Medicine

## 2013-08-28 ENCOUNTER — Encounter: Payer: Medicare Other | Admitting: Internal Medicine

## 2013-08-28 VITALS — BP 135/58 | HR 82 | Temp 97.9°F | Wt 139.2 lb

## 2013-08-28 DIAGNOSIS — I10 Essential (primary) hypertension: Secondary | ICD-10-CM

## 2013-08-28 DIAGNOSIS — R279 Unspecified lack of coordination: Secondary | ICD-10-CM

## 2013-08-28 NOTE — Patient Instructions (Signed)
General Instructions: -Your blood pressure is great today! Continue taking your medications and call us if you need refills.  -We will contact Lanora Manis, the physical therapist you requested to arrange home physical therapy.  -Follow up in 3 months.    Treatment Goals:  Goals (1 Years of Data) as of 08/28/13         As of Today As of Today 08/21/13 07/03/13 06/19/13     Blood Pressure    . Blood Pressure < 140/90  135/58 141/62 173/69 176/68 184/80     Result Component    . HEMOGLOBIN A1C < 7.0    6.4      . LDL CALC < 100      99      Progress Toward Treatment Goals:  Treatment Goal 08/28/2013  Hemoglobin A1C at goal  Blood pressure at goal    Self Care Goals & Plans:  Self Care Goal 08/28/2013  Manage my medications take my medicines as prescribed; bring my medications to every visit; refill my medications on time  Monitor my health -  Eat healthy foods eat foods that are low in salt  Be physically active find an activity I enjoy  Meeting treatment goals -    Home Blood Glucose Monitoring 08/28/2013  Check my blood sugar no home glucose monitoring     Care Management & Community Referrals:  Referral 08/28/2013  Referrals made for care management support none needed  Referrals made to community resources -

## 2013-08-28 NOTE — Progress Notes (Signed)
Case discussed with Dr. Kennerly at the time of the visit.  We reviewed the resident's history and exam and pertinent patient test results.  I agree with the assessment, diagnosis, and plan of care documented in the resident's note. 

## 2013-08-29 NOTE — Assessment & Plan Note (Addendum)
She has had no falls. Per her request, ordered [facility] PT for gait stabilization and fall prevention and evaluation for the need of [facility] equipment such as a cane or walker.

## 2013-08-29 NOTE — Addendum Note (Signed)
Addended by: Neomia Dear on: 08/29/2013 04:11 PM   Modules accepted: Orders

## 2013-08-29 NOTE — Assessment & Plan Note (Signed)
BP Readings from Last 3 Encounters:  08/28/13 135/58  08/21/13 173/69  07/03/13 176/68    Lab Results  Component Value Date   NA 136 08/21/2013   K 4.6 08/21/2013   CREATININE 2.06* 08/21/2013    Assessment: Blood pressure control: controlled Progress toward BP goal:  at goal Comments: She is on Lasix 40mg  daily, Norvasc 10mg  daily, and Cozaar 100mg  daily which she had run out of for months until her last visit with me last week.  Plan: Medications:  continue current medications Educational resources provided:   Self management tools provided:   Other plans: Counseled patient on calling us if she was again unable to get any her medications refilled, she agreed and understood.

## 2013-08-29 NOTE — Progress Notes (Signed)
  Subjective:    Patient ID: [name], female    DOB: [birth year], 79 y.o.   MRN: [id number]  HPI [name] is a pleasant 79 year old woman with PMH of CLL, CKD stage 3, DM2, osteoarthritis, and HTN who presents for BP follow up. She has been taking Cozaar since last week when I last saw her and her blood pressure is well controlled during this visit.  She had no complaints during this visit.    Review of Systems  Constitutional: Negative for fever, chills, appetite change and fatigue.  Respiratory: Negative for shortness of breath.   Cardiovascular: Negative for chest pain, palpitations and leg swelling.  Musculoskeletal: Positive for arthralgias.  Neurological: Negative for dizziness, syncope and light-headedness.  Psychiatric/Behavioral: Negative for behavioral problems and agitation.       Objective:   Physical Exam  Nursing note and vitals reviewed. Constitutional: She is oriented to person, place, and time. She appears well-developed and well-nourished. No distress.  Sitting on wheelchair as in her previous visits.   Cardiovascular: Normal rate and regular rhythm.   Pulmonary/Chest: Effort normal. No respiratory distress.  Musculoskeletal: She exhibits no edema and no tenderness.  Neurological: She is alert and oriented to person, place, and time.  Skin: She is not diaphoretic.  Psychiatric: She has a normal mood and affect. Her behavior is normal.          Assessment & Plan:

## 2013-09-04 NOTE — Progress Notes (Signed)
Case discussed with Dr. Kennerly soon after the resident saw the patient.  We reviewed the resident's history and exam and pertinent patient test results.  I agree with the assessment, diagnosis and plan of care documented in the resident's note. 

## 2013-09-23 ENCOUNTER — Other Ambulatory Visit: Payer: Self-pay | Admitting: Internal Medicine

## 2013-10-03 ENCOUNTER — Other Ambulatory Visit (HOSPITAL_BASED_OUTPATIENT_CLINIC_OR_DEPARTMENT_OTHER): Payer: Medicare Other | Admitting: Lab

## 2013-10-03 DIAGNOSIS — C9110 Chronic lymphocytic leukemia of B-cell type not having achieved remission: Secondary | ICD-10-CM

## 2013-10-03 LAB — CBC WITH DIFFERENTIAL/PLATELET
BASO%: 0.4 % (ref 0.0–2.0)
Basophils Absolute: 0.1 10*3/uL (ref 0.0–0.1)
EOS%: 0.9 % (ref 0.0–7.0)
Eosinophils Absolute: 0.3 10*3/uL (ref 0.0–0.5)
HCT: 30.7 % — ABNORMAL LOW (ref 34.8–46.6)
HGB: 9.8 g/dL — ABNORMAL LOW (ref 11.6–15.9)
LYMPH%: 73.7 % — ABNORMAL HIGH (ref 14.0–49.7)
MCH: 29.7 pg (ref 25.1–34.0)
MCHC: 31.8 g/dL (ref 31.5–36.0)
MCV: 93.2 fL (ref 79.5–101.0)
MONO#: 2.1 10*3/uL — ABNORMAL HIGH (ref 0.1–0.9)
MONO%: 6.9 % (ref 0.0–14.0)
NEUT#: 5.5 10*3/uL (ref 1.5–6.5)
NEUT%: 18.1 % — ABNORMAL LOW (ref 38.4–76.8)
Platelets: 329 10*3/uL (ref 145–400)
RBC: 3.29 10*6/uL — ABNORMAL LOW (ref 3.70–5.45)
RDW: 13.9 % (ref 11.2–14.5)
WBC: 30.4 10*3/uL — ABNORMAL HIGH (ref 3.9–10.3)
lymph#: 22.4 10*3/uL — ABNORMAL HIGH (ref 0.9–3.3)

## 2013-10-03 LAB — TECHNOLOGIST REVIEW

## 2013-10-03 LAB — COMPREHENSIVE METABOLIC PANEL (CC13)
ALT: 8 U/L (ref 0–55)
AST: 18 U/L (ref 5–34)
Albumin: 3.6 g/dL (ref 3.5–5.0)
Alkaline Phosphatase: 85 U/L (ref 40–150)
BUN: 19.7 mg/dL (ref 7.0–26.0)
Calcium: 9.3 mg/dL (ref 8.4–10.4)
Chloride: 110 mEq/L — ABNORMAL HIGH (ref 98–109)
Creatinine: 1.4 mg/dL — ABNORMAL HIGH (ref 0.6–1.1)
Glucose: 146 mg/dl — ABNORMAL HIGH (ref 70–140)
Potassium: 4.2 mEq/L (ref 3.5–5.1)
Sodium: 143 mEq/L (ref 136–145)
Total Bilirubin: 0.34 mg/dL (ref 0.20–1.20)

## 2013-10-05 ENCOUNTER — Other Ambulatory Visit: Payer: Self-pay | Admitting: *Deleted

## 2013-10-05 DIAGNOSIS — I10 Essential (primary) hypertension: Secondary | ICD-10-CM

## 2013-10-05 NOTE — Telephone Encounter (Signed)
[facility] - pt requesting 90 day supply.

## 2013-10-06 MED ORDER — FUROSEMIDE 40 MG PO TABS: 40.0000 mg | ORAL_TABLET | Freq: Every day | ORAL | Status: DC

## 2013-11-13 ENCOUNTER — Telehealth: Payer: Self-pay | Admitting: *Deleted

## 2013-11-13 NOTE — Telephone Encounter (Signed)
ptr spouse called to cancel her appt for 11/14/13 due to the flu. He r/s her for 11/24/13 @ 3pm.....td

## 2013-11-14 ENCOUNTER — Other Ambulatory Visit: Payer: Medicare Other

## 2013-11-24 ENCOUNTER — Other Ambulatory Visit (HOSPITAL_BASED_OUTPATIENT_CLINIC_OR_DEPARTMENT_OTHER): Payer: Medicare Other

## 2013-11-24 DIAGNOSIS — C9110 Chronic lymphocytic leukemia of B-cell type not having achieved remission: Secondary | ICD-10-CM

## 2013-11-24 LAB — CBC WITH DIFFERENTIAL/PLATELET
BASO%: 0.5 % (ref 0.0–2.0)
BASOS ABS: 0.2 10*3/uL — AB (ref 0.0–0.1)
EOS%: 0.2 % (ref 0.0–7.0)
Eosinophils Absolute: 0.1 10*3/uL (ref 0.0–0.5)
HEMATOCRIT: 31.4 % — AB (ref 34.8–46.6)
HGB: 10.3 g/dL — ABNORMAL LOW (ref 11.6–15.9)
LYMPH%: 67.9 % — AB (ref 14.0–49.7)
MCH: 30.3 pg (ref 25.1–34.0)
MCHC: 32.7 g/dL (ref 31.5–36.0)
MCV: 92.7 fL (ref 79.5–101.0)
MONO#: 1.1 10*3/uL — ABNORMAL HIGH (ref 0.1–0.9)
MONO%: 3 % (ref 0.0–14.0)
NEUT#: 10.6 10*3/uL — ABNORMAL HIGH (ref 1.5–6.5)
NEUT%: 28.4 % — AB (ref 38.4–76.8)
Platelets: 524 10*3/uL — ABNORMAL HIGH (ref 145–400)
RBC: 3.38 10*6/uL — ABNORMAL LOW (ref 3.70–5.45)
RDW: 14 % (ref 11.2–14.5)
WBC: 37.3 10*3/uL — ABNORMAL HIGH (ref 3.9–10.3)
lymph#: 25.3 10*3/uL — ABNORMAL HIGH (ref 0.9–3.3)

## 2013-11-24 LAB — COMPREHENSIVE METABOLIC PANEL (CC13)
ALK PHOS: 102 U/L (ref 40–150)
ALT: 9 U/L (ref 0–55)
AST: 14 U/L (ref 5–34)
Albumin: 3.6 g/dL (ref 3.5–5.0)
Anion Gap: 16 mEq/L — ABNORMAL HIGH (ref 3–11)
BUN: 26 mg/dL (ref 7.0–26.0)
CO2: 23 mEq/L (ref 22–29)
CREATININE: 1.8 mg/dL — AB (ref 0.6–1.1)
Calcium: 9.6 mg/dL (ref 8.4–10.4)
Chloride: 103 mEq/L (ref 98–109)
GLUCOSE: 228 mg/dL — AB (ref 70–140)
Potassium: 4.2 mEq/L (ref 3.5–5.1)
Sodium: 141 mEq/L (ref 136–145)
Total Bilirubin: 0.53 mg/dL (ref 0.20–1.20)
Total Protein: 7.4 g/dL (ref 6.4–8.3)

## 2013-11-24 LAB — TECHNOLOGIST REVIEW

## 2013-11-30 ENCOUNTER — Other Ambulatory Visit: Payer: Self-pay | Admitting: Internal Medicine

## 2013-12-25 ENCOUNTER — Other Ambulatory Visit: Payer: Medicare Other | Admitting: *Deleted

## 2013-12-25 ENCOUNTER — Other Ambulatory Visit: Payer: Self-pay | Admitting: Internal Medicine

## 2013-12-25 ENCOUNTER — Encounter: Payer: Self-pay | Admitting: Internal Medicine

## 2013-12-25 ENCOUNTER — Ambulatory Visit (INDEPENDENT_AMBULATORY_CARE_PROVIDER_SITE_OTHER): Payer: Medicare Other | Admitting: Internal Medicine

## 2013-12-25 VITALS — BP 139/59 | HR 91 | Temp 97.0°F | Wt 141.1 lb

## 2013-12-25 DIAGNOSIS — R059 Cough, unspecified: Secondary | ICD-10-CM

## 2013-12-25 DIAGNOSIS — R058 Other specified cough: Secondary | ICD-10-CM

## 2013-12-25 DIAGNOSIS — R279 Unspecified lack of coordination: Secondary | ICD-10-CM

## 2013-12-25 DIAGNOSIS — R609 Edema, unspecified: Secondary | ICD-10-CM

## 2013-12-25 DIAGNOSIS — C9110 Chronic lymphocytic leukemia of B-cell type not having achieved remission: Secondary | ICD-10-CM

## 2013-12-25 DIAGNOSIS — E119 Type 2 diabetes mellitus without complications: Secondary | ICD-10-CM

## 2013-12-25 DIAGNOSIS — R05 Cough: Secondary | ICD-10-CM

## 2013-12-25 DIAGNOSIS — M25473 Effusion, unspecified ankle: Secondary | ICD-10-CM

## 2013-12-25 DIAGNOSIS — I10 Essential (primary) hypertension: Secondary | ICD-10-CM

## 2013-12-25 DIAGNOSIS — R040 Epistaxis: Secondary | ICD-10-CM

## 2013-12-25 LAB — POCT GLYCOSYLATED HEMOGLOBIN (HGB A1C): HEMOGLOBIN A1C: 6.4

## 2013-12-25 LAB — GLUCOSE, CAPILLARY: GLUCOSE-CAPILLARY: 148 mg/dL — AB (ref 70–99)

## 2013-12-25 MED ORDER — SALINE NASAL SPRAY 0.65 % NA SOLN
1.0000 | NASAL | Status: DC | PRN
Start: 2013-12-25 — End: 2013-12-25

## 2013-12-25 MED ORDER — SALINE NASAL SPRAY 0.65 % NA SOLN: 1.0000 | NASAL | Status: DC | PRN

## 2013-12-25 NOTE — Patient Instructions (Signed)
General Instructions: -Start using saline nasal spray, 2 sprays in each nostril 4 times per day.  -Call us if your dry cough is getting worse.  -You may use a compression stocking for the mild swelling in your right ankle.  -Follow up with your lab visit with Dr. [name] tomorrow at [facility].  -Follow up with [place] in 3 months or sooner as needed.  -Foi um prazer ver a senhora [name] vez!   Treatment Goals:  Goals (1 Years of Data) as of 12/25/13         As of Today 08/28/13 08/28/13 08/21/13 07/03/13     Blood Pressure    . Blood Pressure < 140/90  139/59 135/58 141/62 173/69 176/68     Result Component    . HEMOGLOBIN A1C < 7.0  6.4   6.4     . LDL CALC < 100            Progress Toward Treatment Goals:  Treatment Goal 12/25/2013  Hemoglobin A1C at goal  Blood pressure at goal    Self Care Goals & Plans:  Self Care Goal 12/25/2013  Manage my medications take my medicines as prescribed; refill my medications on time  Monitor my health -  Eat healthy foods -  Be physically active -  Meeting treatment goals maintain the current self-care plan    Home Blood Glucose Monitoring 12/25/2013  Check my blood sugar no home glucose monitoring     Care Management & Community Referrals:  Referral 08/28/2013  Referrals made for care management support none needed  Referrals made to community resources -

## 2013-12-26 ENCOUNTER — Other Ambulatory Visit (HOSPITAL_BASED_OUTPATIENT_CLINIC_OR_DEPARTMENT_OTHER): Payer: Medicare Other

## 2013-12-26 DIAGNOSIS — R058 Other specified cough: Secondary | ICD-10-CM | POA: Insufficient documentation

## 2013-12-26 DIAGNOSIS — M25473 Effusion, unspecified ankle: Secondary | ICD-10-CM | POA: Insufficient documentation

## 2013-12-26 DIAGNOSIS — C9110 Chronic lymphocytic leukemia of B-cell type not having achieved remission: Secondary | ICD-10-CM

## 2013-12-26 DIAGNOSIS — R05 Cough: Secondary | ICD-10-CM | POA: Insufficient documentation

## 2013-12-26 LAB — CBC WITH DIFFERENTIAL/PLATELET
BASO%: 0.8 % (ref 0.0–2.0)
Basophils Absolute: 0.3 10*3/uL — ABNORMAL HIGH (ref 0.0–0.1)
EOS ABS: 0.3 10*3/uL (ref 0.0–0.5)
EOS%: 1 % (ref 0.0–7.0)
HCT: 29.5 % — ABNORMAL LOW (ref 34.8–46.6)
HGB: 9.5 g/dL — ABNORMAL LOW (ref 11.6–15.9)
LYMPH%: 71.3 % — AB (ref 14.0–49.7)
MCH: 30.1 pg (ref 25.1–34.0)
MCHC: 32.1 g/dL (ref 31.5–36.0)
MCV: 93.6 fL (ref 79.5–101.0)
MONO#: 1.7 10*3/uL — ABNORMAL HIGH (ref 0.1–0.9)
MONO%: 5.2 % (ref 0.0–14.0)
NEUT%: 21.7 % — ABNORMAL LOW (ref 38.4–76.8)
NEUTROS ABS: 7.2 10*3/uL — AB (ref 1.5–6.5)
PLATELETS: 420 10*3/uL — AB (ref 145–400)
RBC: 3.15 10*6/uL — ABNORMAL LOW (ref 3.70–5.45)
RDW: 15.1 % — AB (ref 11.2–14.5)
WBC: 33.1 10*3/uL — ABNORMAL HIGH (ref 3.9–10.3)
lymph#: 23.6 10*3/uL — ABNORMAL HIGH (ref 0.9–3.3)

## 2013-12-26 LAB — COMPREHENSIVE METABOLIC PANEL (CC13)
ALBUMIN: 3.7 g/dL (ref 3.5–5.0)
ALK PHOS: 96 U/L (ref 40–150)
ALT: 15 U/L (ref 0–55)
ANION GAP: 12 meq/L — AB (ref 3–11)
AST: 21 U/L (ref 5–34)
BUN: 45.1 mg/dL — AB (ref 7.0–26.0)
CO2: 26 mEq/L (ref 22–29)
Calcium: 9.5 mg/dL (ref 8.4–10.4)
Chloride: 103 mEq/L (ref 98–109)
Creatinine: 1.9 mg/dL — ABNORMAL HIGH (ref 0.6–1.1)
GLUCOSE: 201 mg/dL — AB (ref 70–140)
POTASSIUM: 4.4 meq/L (ref 3.5–5.1)
Sodium: 141 mEq/L (ref 136–145)
Total Bilirubin: 0.32 mg/dL (ref 0.20–1.20)
Total Protein: 6.7 g/dL (ref 6.4–8.3)

## 2013-12-26 LAB — TECHNOLOGIST REVIEW

## 2013-12-26 NOTE — Assessment & Plan Note (Signed)
She describes having a dry cough during the winter months that is present at night. She denies GERD symptoms with no orthopnea, PND, SOB, no purulent nasal discharge, no fever/chills, or decreased appetite, sinus pain/pressure, sneezing/itching. At this time, post-nasal drip is the most likely etiology.  -Will give her a trial of saline nasal spray 4 times daily  -continue use of Delsym lozenges as needed (her BP remains well controlled with this medication) -She is advised to return to the [facility] if her cough worsens or if she has fever/chills, decreased appetite, or SOB.

## 2013-12-26 NOTE — Assessment & Plan Note (Signed)
BP Readings from Last 3 Encounters:  12/25/13 139/59  08/28/13 135/58  08/21/13 173/69    Lab Results  Component Value Date   NA 141 12/26/2013   K 4.4 12/26/2013   CREATININE 1.9* 12/26/2013    Assessment: Blood pressure control: controlled Progress toward BP goal:  at goal Comments: She is on Cozaar, amlodipine, and Lasix  Plan: Medications:  continue current medications Educational resources provided:   Self management tools provided:   Other plans: Continue monitoring.

## 2013-12-26 NOTE — Progress Notes (Signed)
Subjective:    Patient ID: [name], female    DOB: 1921/10/14, 80 y.o.   MRN: [id number]  Diabetes Pertinent negatives for hypoglycemia include no confusion or dizziness. Pertinent negatives for diabetes include no chest pain, no fatigue and no weakness.   [name] is a 80 year old woman with PMH of HTN, CKD stage 3, CLL, DM2 who presents for routine follow up visit. [name] complains of a dry cough that [name] has had for years during the winter times that is present only at night. The cough started in early December and it does not prevent [name] from sleeping. [name] also has nasal congestion. The cough improves with Delsym lozenges. [name] denies heartburn symptoms, orthopnea, PND. [name] also complains of swelling of [name] right ankle for the past week. [name] denies trauma to [name] right leg or ankle. The swelling improves with rest and leg elevation. [name] explains that [name] has had similar swelling of [name] entire lower leg and right knee in the past that usually subside after wearing a compression stockings and elevating [name] legs. [name] tells me that [name] had 30 sessions of home PT during the fall and feels stronger with more steady gait, [name] does not feel like [name] needs a cane or a walker to help stabilize [name] gait but [name] always uses caution when walking around by holding on to guardrails or furniture and uses a wheelchair when going to appointments.    Review of Systems  Constitutional: Negative for fever, chills, diaphoresis, activity change, appetite change, fatigue and unexpected weight change.  HENT: Positive for congestion, postnasal drip and rhinorrhea. Negative for ear pain, hearing loss, nosebleeds, sinus pressure, sneezing, sore throat, trouble swallowing and voice change.   Eyes: Negative for discharge and itching.  Respiratory: Positive for cough. Negative for choking, shortness of breath and wheezing.   Cardiovascular: Positive for leg swelling. Negative for chest pain and palpitations.    Gastrointestinal: Negative for abdominal pain and blood in stool.  Genitourinary: Negative for dysuria.  Musculoskeletal: Positive for arthralgias.       Chronic bilateral knee pain, worse on the right.   Neurological: Negative for dizziness, weakness and light-headedness.  Psychiatric/Behavioral: Negative for behavioral problems, confusion and agitation.       Objective:   Physical Exam  Nursing note and vitals reviewed. Constitutional: [name] is oriented to person, place, and time. [name] appears well-developed and well-nourished. No distress.  Sitting in wheelchair as in previous office visits.   HENT:  Mouth/Throat: Oropharynx is clear and moist. No oropharyngeal exudate.  Bilateral mildly swollen, erythematous nasal turbinates with no nasal discharge.   Eyes: Conjunctivae are normal. Right eye exhibits no discharge. Left eye exhibits no discharge. No scleral icterus.  Cardiovascular: Normal rate and regular rhythm.   Pulmonary/Chest: Effort normal and breath sounds normal. No respiratory distress. [name] has no wheezes. [name] has no rales.  Abdominal: Soft. Bowel sounds are normal.  Musculoskeletal: [name] exhibits edema. [name] exhibits no tenderness.  Left ankle with 1+ pitting edema that extends down to the lateral aspect of [name] foot with with no discoloration, no signs of trauma, not TTP, with no increased warmth and with no erythema.   DP, TP 1+ and equal bilaterally.   Neurological: [name] is alert and oriented to person, place, and time. No cranial nerve deficit. Coordination abnormal.  Skin: Skin is warm and dry. [name] is not diaphoretic.  Psychiatric: [name] has a normal mood and affect. [name] behavior is normal.  Assessment & Plan:

## 2013-12-26 NOTE — Assessment & Plan Note (Addendum)
Lab Results  Component Value Date   HGBA1C 6.4 12/25/2013   HGBA1C 6.4 08/21/2013   HGBA1C 6.2 04/03/2013     Assessment: Diabetes control: good control (HgbA1C at goal) Progress toward A1C goal:  at goal Comments: On diet control alone  Plan: Medications:  continue diet control. Noted new ADA guidelines with goal A1C of 8-8.5% for her age group.  Home glucose monitoring: Frequency: no home glucose monitoring Timing:   Instruction/counseling given: discussed foot care Educational resources provided:   Self management tools provided:   Other plans: Continue monitoring.

## 2013-12-26 NOTE — Assessment & Plan Note (Signed)
She denies recent falls and reports undergoing 30 sessions of PT at home. She states that she feels stronger and safer at home with good techniques to prevent falls such as holding on to furniture and guardrails. She does not want to start using a cane or a walker a this time. As she stated in previous visits, she does not wish to have more Moore as her husband does not want "strangers" in their home. They do have a physical therapist that is their friend and would call her if [facility] PT was recommended again.

## 2013-12-26 NOTE — Assessment & Plan Note (Signed)
She has had edema of her right ankle for 1 week which improves with leg elevation. No recent trauma, no tenderness to palpation, ROM at her baseline. She has had similar right leg edema in the best that resolved with use of compression stocking. This could be dependent edema.  -Pt to use compression stocking and elevate leg while at rest.

## 2013-12-27 NOTE — Progress Notes (Signed)
INTERNAL MEDICINE TEACHING ATTENDING ADDENDUM - Ronn Smolinsky, DO: I reviewed and discussed at the time of visit with the resident Dr. Kennerly, the patient's medical history, physical examination, diagnosis and results of tests and treatment and I agree with the patient's care as documented. 

## 2014-02-06 ENCOUNTER — Other Ambulatory Visit (HOSPITAL_BASED_OUTPATIENT_CLINIC_OR_DEPARTMENT_OTHER): Payer: Medicare Other

## 2014-02-06 DIAGNOSIS — C9110 Chronic lymphocytic leukemia of B-cell type not having achieved remission: Secondary | ICD-10-CM

## 2014-02-06 LAB — CBC WITH DIFFERENTIAL/PLATELET
BASO%: 0.5 % (ref 0.0–2.0)
Basophils Absolute: 0.2 10*3/uL — ABNORMAL HIGH (ref 0.0–0.1)
EOS%: 0.9 % (ref 0.0–7.0)
Eosinophils Absolute: 0.3 10*3/uL (ref 0.0–0.5)
HCT: 31.6 % — ABNORMAL LOW (ref 34.8–46.6)
HGB: 10 g/dL — ABNORMAL LOW (ref 11.6–15.9)
LYMPH#: 30 10*3/uL — AB (ref 0.9–3.3)
LYMPH%: 77 % — ABNORMAL HIGH (ref 14.0–49.7)
MCH: 29.7 pg (ref 25.1–34.0)
MCHC: 31.7 g/dL (ref 31.5–36.0)
MCV: 93.6 fL (ref 79.5–101.0)
MONO#: 1.8 10*3/uL — AB (ref 0.1–0.9)
MONO%: 4.7 % (ref 0.0–14.0)
NEUT#: 6.6 10*3/uL — ABNORMAL HIGH (ref 1.5–6.5)
NEUT%: 16.9 % — ABNORMAL LOW (ref 38.4–76.8)
Platelets: 348 10*3/uL (ref 145–400)
RBC: 3.38 10*6/uL — AB (ref 3.70–5.45)
RDW: 15.1 % — AB (ref 11.2–14.5)
WBC: 39 10*3/uL — AB (ref 3.9–10.3)

## 2014-02-06 LAB — TECHNOLOGIST REVIEW

## 2014-02-23 ENCOUNTER — Other Ambulatory Visit: Payer: Self-pay | Admitting: Internal Medicine

## 2014-02-23 DIAGNOSIS — M1990 Unspecified osteoarthritis, unspecified site: Secondary | ICD-10-CM

## 2014-02-27 NOTE — Telephone Encounter (Signed)
Called to pharm, pharmacist did not understand what a resident DEA# means

## 2014-03-20 ENCOUNTER — Other Ambulatory Visit (HOSPITAL_BASED_OUTPATIENT_CLINIC_OR_DEPARTMENT_OTHER): Payer: Medicare Other

## 2014-03-20 DIAGNOSIS — C9110 Chronic lymphocytic leukemia of B-cell type not having achieved remission: Secondary | ICD-10-CM

## 2014-03-20 LAB — CBC WITH DIFFERENTIAL/PLATELET
BASO%: 0.4 % (ref 0.0–2.0)
Basophils Absolute: 0.2 10*3/uL — ABNORMAL HIGH (ref 0.0–0.1)
EOS%: 0.6 % (ref 0.0–7.0)
Eosinophils Absolute: 0.3 10*3/uL (ref 0.0–0.5)
HEMATOCRIT: 33.3 % — AB (ref 34.8–46.6)
HGB: 10.6 g/dL — ABNORMAL LOW (ref 11.6–15.9)
LYMPH%: 80.2 % — ABNORMAL HIGH (ref 14.0–49.7)
MCH: 29.6 pg (ref 25.1–34.0)
MCHC: 31.8 g/dL (ref 31.5–36.0)
MCV: 93 fL (ref 79.5–101.0)
MONO#: 1.7 10*3/uL — AB (ref 0.1–0.9)
MONO%: 3.5 % (ref 0.0–14.0)
NEUT#: 7.2 10*3/uL — ABNORMAL HIGH (ref 1.5–6.5)
NEUT%: 15.3 % — AB (ref 38.4–76.8)
Platelets: 355 10*3/uL (ref 145–400)
RBC: 3.58 10*6/uL — ABNORMAL LOW (ref 3.70–5.45)
RDW: 14.3 % (ref 11.2–14.5)
WBC: 47.2 10*3/uL — AB (ref 3.9–10.3)
lymph#: 37.9 10*3/uL — ABNORMAL HIGH (ref 0.9–3.3)

## 2014-03-20 LAB — COMPREHENSIVE METABOLIC PANEL (CC13)
ALK PHOS: 105 U/L (ref 40–150)
ALT: 9 U/L (ref 0–55)
ANION GAP: 12 meq/L — AB (ref 3–11)
AST: 23 U/L (ref 5–34)
Albumin: 3.9 g/dL (ref 3.5–5.0)
BILIRUBIN TOTAL: 0.35 mg/dL (ref 0.20–1.20)
BUN: 27 mg/dL — ABNORMAL HIGH (ref 7.0–26.0)
CO2: 24 meq/L (ref 22–29)
Calcium: 10.1 mg/dL (ref 8.4–10.4)
Chloride: 108 mEq/L (ref 98–109)
Creatinine: 1.6 mg/dL — ABNORMAL HIGH (ref 0.6–1.1)
Glucose: 150 mg/dl — ABNORMAL HIGH (ref 70–140)
Potassium: 4.3 mEq/L (ref 3.5–5.1)
Sodium: 144 mEq/L (ref 136–145)
Total Protein: 7.1 g/dL (ref 6.4–8.3)

## 2014-03-20 LAB — TECHNOLOGIST REVIEW

## 2014-03-26 ENCOUNTER — Ambulatory Visit (INDEPENDENT_AMBULATORY_CARE_PROVIDER_SITE_OTHER): Payer: Medicare Other | Admitting: Internal Medicine

## 2014-03-26 ENCOUNTER — Encounter: Payer: Self-pay | Admitting: Internal Medicine

## 2014-03-26 VITALS — BP 173/60 | HR 67 | Temp 97.4°F | Wt 142.1 lb

## 2014-03-26 DIAGNOSIS — Z7189 Other specified counseling: Secondary | ICD-10-CM

## 2014-03-26 DIAGNOSIS — R279 Unspecified lack of coordination: Secondary | ICD-10-CM

## 2014-03-26 DIAGNOSIS — I10 Essential (primary) hypertension: Secondary | ICD-10-CM

## 2014-03-26 DIAGNOSIS — M1990 Unspecified osteoarthritis, unspecified site: Secondary | ICD-10-CM

## 2014-03-26 MED ORDER — AMLODIPINE BESYLATE 10 MG PO TABS: 10.0000 mg | ORAL_TABLET | Freq: Every day | ORAL | Status: DC

## 2014-03-26 MED ORDER — TRAMADOL-ACETAMINOPHEN 37.5-325 MG PO TABS: 1.0000 | ORAL_TABLET | Freq: Four times a day (QID) | ORAL | Status: DC | PRN

## 2014-03-26 NOTE — Assessment & Plan Note (Signed)
BP Readings from Last 3 Encounters:  03/26/14 173/60  12/25/13 139/59  08/28/13 135/58    Lab Results  Component Value Date   NA 144 03/20/2014   K 4.3 03/20/2014   CREATININE 1.6* 03/20/2014    Assessment: Blood pressure control:  No controlled Progress toward BP goal:   Not at goal Comments: She is on Lasix 40mg  daily, losartan 100mg  daily, and amlodipine 10mg  daily but has not been taking amlodipine as she thought one of her providers had told her not to take this medication anymore.   Plan: Medications:  continue current medications. Pt advised to restart taking amlodipine 10mg  daily, she has this medication at home but refills were sent as well.  Educational resources provided:    Self management tools provided:   Other plans: She will follow up in one month

## 2014-03-26 NOTE — Assessment & Plan Note (Addendum)
2/2 Varus deformity of bilaterally knees 2/2 OA. No recent falls. She states that she feels "strong" and does not need PT again.

## 2014-03-26 NOTE — Assessment & Plan Note (Signed)
Refilled Ultracet 37.5mg  1-2 tablet q6hr PRN for pain #30 with 3 refills ([facility], printed prescription was voided and discarded).

## 2014-03-26 NOTE — Patient Instructions (Signed)
-  Continue taking amlodipine 10mg  daily.  -Return in 1 month to discuss advance directives and MOST form and for blood pressure recheck.

## 2014-03-26 NOTE — Assessment & Plan Note (Signed)
Pt brought 2-page hand written document with end-of-life instructions. Pt wants this document to be scanned into her chart and we will do so.  She was given an advance directive packet and MOST [place]. She wants to discuss the MOST form with her husband but tells me that she is leaning towards being DNR/DNI.  She was given Reunion Grady's hours and contact number in case she has more questions in regards to filling out these forms.  She will return to the clinic in one month.

## 2014-03-26 NOTE — Progress Notes (Signed)
   Subjective:    Patient ID: [name], female    DOB: May 17, 1921, 80 y.o.   MRN: [id number]  HPI [name] is a 80 yr old woman, originally from [place], resident of [place] for many years with PMH of HTN, CKD stage 3, CLL, who presents for blood pressure follow up and for discussion of advance directives. She brings a handwritten note with detailed instructions in regards to arrangements to be made in case of her death.  Since I last her her cough and ankle swelling have resolved.  She reports being in good health but wants to have documentation to detail her wishes if she becomes ill.  She denies recent falls. Continues to have chronic bilateral knee pain that respond well to Ultracet, she requests refill on this medication.     Review of Systems  Constitutional: Negative for fever, chills, diaphoresis and appetite change.  Respiratory: Negative for cough, shortness of breath and wheezing.   Cardiovascular: Negative for chest pain, palpitations and leg swelling.  Gastrointestinal: Negative for abdominal pain.  Genitourinary: Negative for dysuria.  Musculoskeletal: Positive for arthralgias.  Neurological: Negative for dizziness and light-headedness.  Psychiatric/Behavioral: Negative for confusion.       Objective:   Physical Exam  Nursing note and vitals reviewed. Constitutional: She is oriented to person, place, and time. She appears well-developed and well-nourished. No distress.  Eyes:  Wearing eyeglasses   Cardiovascular: Normal rate and regular rhythm.   Pulmonary/Chest: Effort normal and breath sounds normal. No respiratory distress. She has no wheezes. She has no rales.  Abdominal: Soft.  Musculoskeletal: She exhibits no edema and no tenderness.  Bilateral varus knee deformity  Neurological: She is alert and oriented to person, place, and time.  Skin: Skin is warm and dry. She is not diaphoretic.  Psychiatric: She has a normal mood and affect.            Assessment & Plan:

## 2014-03-29 NOTE — Progress Notes (Signed)
Case discussed with Dr. Kennerly at the time of the visit.  We reviewed the resident's history and exam and pertinent patient test results.  I agree with the assessment, diagnosis, and plan of care documented in the resident's note. 

## 2014-04-30 ENCOUNTER — Ambulatory Visit (INDEPENDENT_AMBULATORY_CARE_PROVIDER_SITE_OTHER): Payer: Medicare Other | Admitting: Internal Medicine

## 2014-04-30 ENCOUNTER — Encounter: Payer: Self-pay | Admitting: Internal Medicine

## 2014-04-30 VITALS — BP 167/65 | HR 77 | Temp 97.0°F | Wt 142.3 lb

## 2014-04-30 DIAGNOSIS — I10 Essential (primary) hypertension: Secondary | ICD-10-CM

## 2014-04-30 DIAGNOSIS — E119 Type 2 diabetes mellitus without complications: Secondary | ICD-10-CM

## 2014-04-30 DIAGNOSIS — M1990 Unspecified osteoarthritis, unspecified site: Secondary | ICD-10-CM

## 2014-04-30 DIAGNOSIS — Z7189 Other specified counseling: Secondary | ICD-10-CM

## 2014-04-30 LAB — GLUCOSE, CAPILLARY: GLUCOSE-CAPILLARY: 170 mg/dL — AB (ref 70–99)

## 2014-04-30 LAB — POCT GLYCOSYLATED HEMOGLOBIN (HGB A1C): Hemoglobin A1C: 6.9

## 2014-04-30 NOTE — Assessment & Plan Note (Signed)
BP Readings from Last 3 Encounters:  04/30/14 167/65  03/26/14 173/60  12/25/13 139/59    Lab Results  Component Value Date   NA 144 03/20/2014   K 4.3 03/20/2014   CREATININE 1.6* 03/20/2014    Assessment: Blood pressure control: mildly elevated Progress toward BP goal:  improved Comments: She is on Cozaar 100mg  daily, Lasix 40mg  daily, and amlodipine10mg  daily. She has resumed taking amlodipine daily since she last saw me but forgot to take it before this visit as she was rushed.   Plan: Medications:  continue current medications Educational resources provided:   Self management tools provided:   Other plans: She will continue taking her medications as above with follow up in 2 weeks.

## 2014-04-30 NOTE — Assessment & Plan Note (Signed)
Lab Results  Component Value Date   HGBA1C 6.9 04/30/2014   HGBA1C 6.4 12/25/2013   HGBA1C 6.4 08/21/2013     Assessment: Diabetes control: good control (HgbA1C at goal) Progress toward A1C goal:  at goal Comments: Diet controlled, HgA1C at 6.9%  Plan: Medications:  continue diet control Home glucose monitoring: Frequency: no home glucose monitoring Timing:   Instruction/counseling given: discussed diet Educational resources provided:   Self management tools provided:   Other plans: Continue monitoring.

## 2014-04-30 NOTE — Assessment & Plan Note (Signed)
She forgot to bring her MOST form but states that she will bring it during her next visit.

## 2014-04-30 NOTE — Assessment & Plan Note (Signed)
Not worsening pain. She will start using a cane for better stabilization of her gait. She denies falls, states that she feels strong and does not want PT again.

## 2014-04-30 NOTE — Progress Notes (Signed)
   Subjective:    Patient ID: ANUPAMA PIEHL, female    DOB: [birth year], 80 y.o.   MRN: [id number]  Hypertension Pertinent negatives include no chest pain, headaches, palpitations or shortness of breath.   Ms. Sarratt is a 80 yr old woman with PMH of HTN, CKD stage 3, stable CLL, who presents for blood pressure follow up and for discussion of advance directives.  She has been taking amlodipine 10mg  daily since I last saw her but she forgot to take it today due to being rushed to come here.  She has purchased a cane and will start using it when she goes out. She denies recent falls and states that she is strong and does not need PT.   She forgot to bring her MOST form but has read it and agrees to bring it to the [facility].      Review of Systems  Constitutional: Negative for fever, chills, diaphoresis, activity change, appetite change, fatigue and unexpected weight change.  Respiratory: Negative for cough and shortness of breath.   Cardiovascular: Negative for chest pain, palpitations and leg swelling.  Gastrointestinal: Negative for abdominal pain.  Genitourinary: Negative for dysuria.  Musculoskeletal: Negative for arthralgias and back pain.  Neurological: Negative for dizziness, weakness, light-headedness and headaches.  Psychiatric/Behavioral: Negative for behavioral problems and agitation.       Objective:   Physical Exam  Nursing note and vitals reviewed. Constitutional: She is oriented to person, place, and time. She appears well-developed and well-nourished. No distress.  Sitting in wheelchair  Cardiovascular: Normal rate and regular rhythm.   Pulmonary/Chest: Effort normal and breath sounds normal. No respiratory distress. She has no wheezes. She has no rales.  Abdominal: Soft. There is no tenderness.  Musculoskeletal: She exhibits no edema and no tenderness.  Right knee with no edema, erythema, or effusion, not TTP  Neurological: She is alert and oriented to person, place,  and time. Coordination abnormal.  Skin: Skin is warm and dry. No rash noted. She is not diaphoretic. No erythema. No pallor.  Psychiatric: She has a normal mood and affect. Her behavior is normal.          Assessment & Plan:

## 2014-04-30 NOTE — Patient Instructions (Signed)
General Instructions: -Start using a cane to stabilize your gait.  -Fill out the MOST form and bring it to the front desk or during your next appointment.  -Follow up in 2 months.   Comece a usar uma bengala para estabilizar seu equilibrio quando a senhora caminha.  Preenha o formulario MOST e traga este formulario para a secretaria aqui our durante sua proxima visita.  Visita de seguimento para dois meses.    Please bring your medicines with you each time you come.   Medicines may be  Eye drops  Herbal   Vitamins  Pills  Seeing these help [place] take care of you.  Treatment Goals:  Goals (1 Years of Data) as of 04/30/14         As of Today 03/26/14 03/26/14 12/25/13 08/28/13     Blood Pressure    . Blood Pressure < 140/90  162/65 173/60 178/82 139/59 135/58     Result Component    . HEMOGLOBIN A1C < 8.0     6.4     . LDL CALC < 100            Progress Toward Treatment Goals:  Treatment Goal 04/30/2014  Hemoglobin A1C at goal  Blood pressure improved    Self Care Goals & Plans:  Self Care Goal 04/30/2014  Manage my medications take my medicines as prescribed; bring my medications to every visit  Monitor my health -  Eat healthy foods -  Be physically active -  Meeting treatment goals -    Home Blood Glucose Monitoring 04/30/2014  Check my blood sugar no home glucose monitoring     Care Management & Community Referrals:  Referral 04/30/2014  Referrals made for care management support other (see comment)  Referrals made to community resources -

## 2014-05-01 ENCOUNTER — Other Ambulatory Visit (HOSPITAL_BASED_OUTPATIENT_CLINIC_OR_DEPARTMENT_OTHER): Payer: Medicare Other

## 2014-05-01 DIAGNOSIS — C9110 Chronic lymphocytic leukemia of B-cell type not having achieved remission: Secondary | ICD-10-CM

## 2014-05-01 LAB — CBC WITH DIFFERENTIAL/PLATELET
BASO%: 0.5 % (ref 0.0–2.0)
Basophils Absolute: 0.2 10*3/uL — ABNORMAL HIGH (ref 0.0–0.1)
EOS%: 1 % (ref 0.0–7.0)
Eosinophils Absolute: 0.5 10*3/uL (ref 0.0–0.5)
HEMATOCRIT: 32.6 % — AB (ref 34.8–46.6)
HGB: 10.3 g/dL — ABNORMAL LOW (ref 11.6–15.9)
LYMPH#: 45.4 10*3/uL — AB (ref 0.9–3.3)
LYMPH%: 86 % — ABNORMAL HIGH (ref 14.0–49.7)
MCH: 29.7 pg (ref 25.1–34.0)
MCHC: 31.6 g/dL (ref 31.5–36.0)
MCV: 93.9 fL (ref 79.5–101.0)
MONO#: 0.3 10*3/uL (ref 0.1–0.9)
MONO%: 0.5 % (ref 0.0–14.0)
NEUT#: 6.4 10*3/uL (ref 1.5–6.5)
NEUT%: 12 % — ABNORMAL LOW (ref 38.4–76.8)
Platelets: 350 10*3/uL (ref 145–400)
RBC: 3.47 10*6/uL — AB (ref 3.70–5.45)
RDW: 14.3 % (ref 11.2–14.5)
WBC: 52.8 10*3/uL — AB (ref 3.9–10.3)

## 2014-05-01 LAB — TECHNOLOGIST REVIEW

## 2014-05-03 NOTE — Progress Notes (Signed)
Case discussed with Dr. Kennerly soon after the resident saw the patient.  We reviewed the resident's history and exam and pertinent patient test results.  I agree with the assessment, diagnosis, and plan of care documented in the resident's note. 

## 2014-05-15 ENCOUNTER — Other Ambulatory Visit: Payer: Self-pay | Admitting: Internal Medicine

## 2014-06-11 ENCOUNTER — Other Ambulatory Visit: Payer: Self-pay | Admitting: *Deleted

## 2014-06-11 DIAGNOSIS — C9110 Chronic lymphocytic leukemia of B-cell type not having achieved remission: Secondary | ICD-10-CM

## 2014-06-12 ENCOUNTER — Other Ambulatory Visit (HOSPITAL_BASED_OUTPATIENT_CLINIC_OR_DEPARTMENT_OTHER): Payer: Medicare Other

## 2014-06-12 ENCOUNTER — Telehealth: Payer: Self-pay | Admitting: Oncology

## 2014-06-12 ENCOUNTER — Ambulatory Visit (HOSPITAL_BASED_OUTPATIENT_CLINIC_OR_DEPARTMENT_OTHER): Payer: Medicare Other | Admitting: Oncology

## 2014-06-12 VITALS — BP 135/45 | HR 82 | Temp 98.0°F | Resp 18 | Ht <= 58 in | Wt 144.0 lb

## 2014-06-12 DIAGNOSIS — N189 Chronic kidney disease, unspecified: Secondary | ICD-10-CM

## 2014-06-12 DIAGNOSIS — C9110 Chronic lymphocytic leukemia of B-cell type not having achieved remission: Secondary | ICD-10-CM

## 2014-06-12 DIAGNOSIS — D649 Anemia, unspecified: Secondary | ICD-10-CM

## 2014-06-12 LAB — CBC WITH DIFFERENTIAL/PLATELET
BASO%: 0.5 % (ref 0.0–2.0)
BASOS ABS: 0.3 10*3/uL — AB (ref 0.0–0.1)
EOS%: 0.5 % (ref 0.0–7.0)
Eosinophils Absolute: 0.3 10*3/uL (ref 0.0–0.5)
HEMATOCRIT: 30.9 % — AB (ref 34.8–46.6)
HEMOGLOBIN: 9.8 g/dL — AB (ref 11.6–15.9)
LYMPH%: 83.3 % — ABNORMAL HIGH (ref 14.0–49.7)
MCH: 29.7 pg (ref 25.1–34.0)
MCHC: 31.6 g/dL (ref 31.5–36.0)
MCV: 94 fL (ref 79.5–101.0)
MONO#: 2.2 10*3/uL — ABNORMAL HIGH (ref 0.1–0.9)
MONO%: 3.2 % (ref 0.0–14.0)
NEUT#: 8.6 10*3/uL — ABNORMAL HIGH (ref 1.5–6.5)
NEUT%: 12.5 % — AB (ref 38.4–76.8)
Platelets: 387 10*3/uL (ref 145–400)
RBC: 3.29 10*6/uL — ABNORMAL LOW (ref 3.70–5.45)
RDW: 14.4 % (ref 11.2–14.5)
WBC: 68.6 10*3/uL (ref 3.9–10.3)
lymph#: 57.1 10*3/uL — ABNORMAL HIGH (ref 0.9–3.3)

## 2014-06-12 LAB — COMPREHENSIVE METABOLIC PANEL (CC13)
ALK PHOS: 99 U/L (ref 40–150)
ALT: 11 U/L (ref 0–55)
AST: 24 U/L (ref 5–34)
Albumin: 4 g/dL (ref 3.5–5.0)
Anion Gap: 13 mEq/L — ABNORMAL HIGH (ref 3–11)
BUN: 45.1 mg/dL — AB (ref 7.0–26.0)
CALCIUM: 9.8 mg/dL (ref 8.4–10.4)
CHLORIDE: 105 meq/L (ref 98–109)
CO2: 23 mEq/L (ref 22–29)
CREATININE: 2.6 mg/dL — AB (ref 0.6–1.1)
GLUCOSE: 157 mg/dL — AB (ref 70–140)
Potassium: 4.6 mEq/L (ref 3.5–5.1)
Sodium: 140 mEq/L (ref 136–145)
Total Bilirubin: 0.39 mg/dL (ref 0.20–1.20)
Total Protein: 7.3 g/dL (ref 6.4–8.3)

## 2014-06-12 LAB — TECHNOLOGIST REVIEW

## 2014-06-12 NOTE — Progress Notes (Signed)
ID: [name]   DOB: 1921-09-02  MR#: [id number]  CSN#:[id number]  PCP: Tracy Pais, MD GYN: SU:  OTHER MD: [name]    INTERVAL HISTORY: [name] returns today with her husband [name] for followup of her chronic lymphoid leukemia. She tells me that "everything is fine". However [name] looks like he is continuing to decline. She does not use a wheelchair at home but she seems so unsteady up front that they brought her to the visit in a wheelchair.  REVIEW OF SYSTEMS: [name] denies any unexplained fatigue or unexplained weight loss. She tells me she does all her normal activities, mostly taking care of the house "and him". (Have been married more than 7 years). There have been no fevers, drenching sweats, rash, or adenopathy. A detailed review of systems today according to her was entirely benign  PAST MEDICAL HISTORY: Past Medical History  Diagnosis Date  . Diabetes mellitus   . Hyperlipidemia   . Hypertension   . Carotid stenosis, right 08/2005    40-60%  . History of colonic polyps 2002    Normal colonoscopy 08/2004  . CKD (chronic kidney disease) stage 2, GFR 60-89 ml/min   . CLL (chronic lymphocytic leukemia)     Dr. Jana Hampton. s/p Rituxan  . Pulse irregularity     Etiology->frequent PACs, normal EKG    FAMILY HISTORY No family history on file.  GYNECOLOGIC HISTORY: GX P0  SOCIAL HISTORY: She is originally from [place]. 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 They have no children. They are Catholic.  ADVANCED DIRECTIVES: in place  HEALTH MAINTENANCE: History  Substance Use Topics  . Smoking status: Former Research scientist (life sciences)  . Smokeless tobacco: Not on file  . Alcohol Use: Not on file     Colonoscopy: Buccini  PAP:  Bone density:  Lipid panel:  No Known Allergies  Current Outpatient Prescriptions  Medication Sig Dispense Refill  . acetaminophen (TYLENOL) 500 MG tablet Take 1,000 mg by mouth 2 (two) times daily as needed. For pain      .  AGGRENOX 25-200 MG per 12 hr capsule TAKE ONE CAPSULE BY MOUTH TWICE A Hampton  200 capsule  2  . amLODipine (NORVASC) 10 MG tablet Take 1 tablet (10 mg total) by mouth daily.  30 tablet  11  . Calcium Carbonate-Vitamin D (CALCIUM PLUS VITAMIN D PO) Take 1 tablet by mouth 2 (two) times daily.      . Cranberry 450 MG CAPS Take 1 capsule by mouth daily.      . furosemide (LASIX) 40 MG tablet Take 1 tablet (40 mg total) by mouth daily.  90 tablet  6  . losartan (COZAAR) 100 MG tablet TAKE 1 TABLET BY MOUTH EVERY Hampton  100 tablet  1  . Multiple Vitamins-Minerals (MACULAR VITAMIN BENEFIT PO) Take 1 tablet by mouth 2 (two) times daily.      . Multiple Vitamins-Minerals (MULTIVITAMINS THER. W/MINERALS) TABS Take 1 tablet by mouth daily.      . Omega-3 Fatty Acids (FISH OIL) 1200 MG CAPS Take 1 capsule by mouth daily.      [name] omeprazole (PRILOSEC) 20 MG capsule Take 1 capsule (20 mg total) by mouth daily.  100 capsule  3  . Polyethyl Glycol-Propyl Glycol (SYSTANE OP) Place 1 drop into both eyes 2 (two) times daily.      . simvastatin (ZOCOR) 20 MG tablet TAKE 1 TABLET BY MOUTH EVERY Hampton AT BEDTIME  100 tablet  2  .  sodium chloride (OCEAN NASAL SPRAY) 0.65 % nasal spray Place 1 spray into the nose as needed for congestion (You may use it 2-3 times per Hampton.).  45 mL  3  . traMADol-acetaminophen (ULTRACET) 37.5-325 MG per tablet Take 1-2 tablets by mouth every 6 (six) hours as needed for moderate pain.  30 tablet  3   No current facility-administered medications for this visit.    OBJECTIVE: Elderly white woman who appears stated age 10 Vitals:   06/12/14 1511  BP: 135/45  Pulse: 82  Temp: 98 F (36.7 C)  Resp: 18     Body mass index is 35.37 kg/(m^2).    ECOG FS: 2  Sclerae unicteric, pupils round and reactive Oropharynx clear and moist No cervical, supraclavicular, or axillary adenopathy Lungs no rales or rhonchi Heart regular rate and rhythm Abd soft, nontender, obese, positive bowel sounds, no  splenomegaly MSK no focal spinal tenderness, no peripheral edema; Neuro: nonfocal, well oriented, pleasant affect   LAB RESULTS: Lab Results  Component Value Date   WBC 68.6* 06/12/2014   NEUTROABS 8.6* 06/12/2014   HGB 9.8* 06/12/2014   HCT 30.9* 06/12/2014   MCV 94.0 06/12/2014   PLT 387 06/12/2014      Chemistry      Component Value Date/Time   NA 144 03/20/2014 1505   NA 136 08/21/2013 1429   K 4.3 03/20/2014 1505   K 4.6 08/21/2013 1429   CL 95* 08/21/2013 1429   CL 103 04/18/2013 1422   CO2 24 03/20/2014 1505   CO2 25 08/21/2013 1429   BUN 27.0* 03/20/2014 1505   BUN 47* 08/21/2013 1429   CREATININE 1.6* 03/20/2014 1505   CREATININE 2.06* 08/21/2013 1429   CREATININE 1.89* 06/28/2012 1425      Component Value Date/Time   CALCIUM 10.1 03/20/2014 1505   CALCIUM 9.3 08/21/2013 1429   CALCIUM 10.5 08/07/2008 0000   ALKPHOS 105 03/20/2014 1505   ALKPHOS 77 06/28/2012 1425   AST 23 03/20/2014 1505   AST 15 06/28/2012 1425   ALT 9 03/20/2014 1505   ALT 10 06/28/2012 1425   BILITOT 0.35 03/20/2014 1505   BILITOT 0.4 06/28/2012 1425       No results found for this basename: LABCA2    No components found with this basename: LABCA125    No results found for this basename: INR,  in the last 168 hours  Urinalysis No results found for this basename: colorurine,  appearanceur,  labspec,  phurine,  glucoseu,  hgbur,  bilirubinur,  ketonesur,  proteinur,  urobilinogen,  nitrite,  leukocytesur    STUDIES: No results found.  ASSESSMENT: 80 y.o.  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  Status post treatment with Rituxan in 2004 and 2007, with no treatment after November 2007.   (2) anemia, possibly due to CLL progression or 2 [place] deficiency with chronic renal insufficiency     PLAN:  [name] is clinically stable. However she is having worsening anemia. This could well  be [place] deficiency. It could be progression from her leukemia, but her neutrophils and platelets are fine.  After much thought what we decided to do is bring her back for additional lab work in about a month. This will give [place] another point on the hemoglobin and also check a reticulocyte count, ferritin, folate, and labs associated with her CLL. She will then see me again about a  month after that to review results and at that point we will decide whether to start Rituxan, start Aranesp,, or simply continue followup.  She has a good understanding of the overall plan. She agrees with that. She will call with any problems that may develop before her next visit here.   [name] C    06/12/2014

## 2014-06-12 NOTE — Telephone Encounter (Signed)
per pof to sch appt-gave pt copy of sch °

## 2014-06-17 ENCOUNTER — Other Ambulatory Visit: Payer: Self-pay | Admitting: Internal Medicine

## 2014-06-21 ENCOUNTER — Other Ambulatory Visit: Payer: Self-pay | Admitting: Internal Medicine

## 2014-06-21 NOTE — Telephone Encounter (Signed)
Hi Gayle,    I approved Cozar refill for her on 7/27. Is this a new script request? Let me know if I need to approve it again.   Thanks!  SK

## 2014-06-25 NOTE — Telephone Encounter (Signed)
Pharmacy called and they do have the Rx written on 7/27

## 2014-07-02 ENCOUNTER — Ambulatory Visit (INDEPENDENT_AMBULATORY_CARE_PROVIDER_SITE_OTHER): Payer: Medicare Other | Admitting: Internal Medicine

## 2014-07-02 ENCOUNTER — Encounter: Payer: Self-pay | Admitting: Internal Medicine

## 2014-07-02 VITALS — BP 125/63 | HR 83 | Temp 97.2°F | Ht <= 58 in | Wt 139.9 lb

## 2014-07-02 DIAGNOSIS — Z7189 Other specified counseling: Secondary | ICD-10-CM

## 2014-07-02 DIAGNOSIS — I10 Essential (primary) hypertension: Secondary | ICD-10-CM

## 2014-07-02 DIAGNOSIS — E119 Type 2 diabetes mellitus without complications: Secondary | ICD-10-CM

## 2014-07-02 DIAGNOSIS — K219 Gastro-esophageal reflux disease without esophagitis: Secondary | ICD-10-CM

## 2014-07-02 LAB — GLUCOSE, CAPILLARY: Glucose-Capillary: 162 mg/dL — ABNORMAL HIGH (ref 70–99)

## 2014-07-02 MED ORDER — OMEPRAZOLE 20 MG PO CPDR: 20.0000 mg | DELAYED_RELEASE_CAPSULE | Freq: Every day | ORAL | Status: DC

## 2014-07-02 MED ORDER — OMEPRAZOLE 20 MG PO CPDR
20.0000 mg | DELAYED_RELEASE_CAPSULE | Freq: Every day | ORAL | Status: DC
Start: 2014-07-02 — End: 2015-06-07

## 2014-07-02 NOTE — Patient Instructions (Signed)
General Instructions: -It was a pleasure seeing again today.  -Continue taking your medications as prescribed.  -Follow up with me in 3 months or sooner if needed.     Treatment Goals:  Goals (1 Years of Data) as of 07/02/14         As of Today 06/12/14 04/30/14 04/30/14 03/26/14     Blood Pressure    . Blood Pressure < 140/90  125/63 135/45 167/65 162/65 173/60     Result Component    . HEMOGLOBIN A1C < 8.0    6.9      . LDL CALC < 100            Progress Toward Treatment Goals:  Treatment Goal 07/02/2014  Hemoglobin A1C at goal  Blood pressure at goal    Self Care Goals & Plans:  Self Care Goal 07/02/2014  Manage my medications take my medicines as prescribed; bring my medications to every visit; refill my medications on time  Monitor my health check my feet daily  Eat healthy foods eat more vegetables; eat foods that are low in salt; eat baked foods instead of fried foods  Be physically active -  Meeting treatment goals maintain the current self-care plan    Home Blood Glucose Monitoring 07/02/2014  Check my blood sugar no home glucose monitoring     Care Management & Community Referrals:  Referral 04/30/2014  Referrals made for care management support other (see comment)  Referrals made to community resources -

## 2014-07-04 DIAGNOSIS — K219 Gastro-esophageal reflux disease without esophagitis: Secondary | ICD-10-CM | POA: Insufficient documentation

## 2014-07-04 NOTE — Progress Notes (Signed)
   Subjective:    Patient ID: [name], female    DOB: November 14, 1921, 80 y.o.   MRN: [id number]  Diabetes Pertinent negatives for diabetes include no chest pain, no fatigue and no weakness.   [name] is a 80 yr old woman with PMH of HTN, CKD stage 3, stable CLL, who presents for blood pressure. In addition she needs refill of her omeprazole. She reports feeling well and trying to eat healthier foods, avoiding sweets and fried foods, she has lost some weight. She has had no falls.  She forgot to bring the MOST form again and requests a new one.     Review of Systems  Constitutional: Negative for fever, chills, diaphoresis, activity change, appetite change, fatigue and unexpected weight change.  Respiratory: Positive for cough. Negative for shortness of breath and wheezing.        She has had an intermittent dry cough present at night  Cardiovascular: Negative for chest pain, palpitations and leg swelling.  Gastrointestinal: Negative for abdominal pain.  Genitourinary: Negative for dysuria.  Neurological: Negative for syncope, weakness and light-headedness.  Psychiatric/Behavioral: Negative for behavioral problems and agitation.       Objective:   Physical Exam  Nursing note and vitals reviewed. Constitutional: She is oriented to person, place, and time. She appears well-developed and well-nourished. No distress.  Sitting in wheelchair  Cardiovascular: Normal rate and regular rhythm.   Pulmonary/Chest: Effort normal and breath sounds normal. No respiratory distress. She has no wheezes. She has no rales.  Abdominal: Soft. Bowel sounds are normal. She exhibits no distension. There is no tenderness. There is no rebound and no guarding.  Musculoskeletal: She exhibits no edema and no tenderness.  Neurological: She is alert and oriented to person, place, and time. Coordination normal.  Skin: Skin is warm and dry. She is not diaphoretic.  Psychiatric: She has a normal mood and  affect. Her behavior is normal.          Assessment & Plan:

## 2014-07-04 NOTE — Assessment & Plan Note (Signed)
BP Readings from Last 3 Encounters:  07/02/14 125/63  06/12/14 135/45  04/30/14 167/65    Lab Results  Component Value Date   NA 140 06/12/2014   K 4.6 06/12/2014   CREATININE 2.6* 06/12/2014    Assessment: Blood pressure control: controlled Progress toward BP goal:  at goal Comments: She is on Norvasc 10mg  daily, Losartan 100mg  daily, Lasix 40mg  daily  Plan: Medications:  continue current medications Educational resources provided:   Self management tools provided:   Other plans: Follow up in 3 months

## 2014-07-04 NOTE — Assessment & Plan Note (Signed)
She was provided with another copy of the MOST form for her review

## 2014-07-04 NOTE — Assessment & Plan Note (Signed)
She requested refill of omeprazole 20mg  daily which was sent to her pharmacy

## 2014-07-04 NOTE — Assessment & Plan Note (Signed)
Lab Results  Component Value Date   HGBA1C 6.9 04/30/2014   HGBA1C 6.4 12/25/2013   HGBA1C 6.4 08/21/2013     Assessment: Diabetes control: good control (HgbA1C at goal) Progress toward A1C goal:  at goal Comments: Diet controlled, she has lost 4 lbs intentionally by cutting back on sweets and fried foods  Plan: Medications:  continue diet control Home glucose monitoring: Frequency: no home glucose monitoring Timing:   Instruction/counseling given: reminded to bring medications to each visit and discussed foot care Educational resources provided: handout Self management tools provided:   Other plans: Foot exam performed during this visit. Follow up in 3 months.

## 2014-07-05 NOTE — Progress Notes (Signed)
Case discussed with Dr. Kennerly soon after the resident saw the patient.  We reviewed the resident's history and exam and pertinent patient test results.  I agree with the assessment, diagnosis, and plan of care documented in the resident's note. 

## 2014-07-10 ENCOUNTER — Other Ambulatory Visit (HOSPITAL_BASED_OUTPATIENT_CLINIC_OR_DEPARTMENT_OTHER): Payer: Medicare Other

## 2014-07-10 DIAGNOSIS — N189 Chronic kidney disease, unspecified: Secondary | ICD-10-CM

## 2014-07-10 DIAGNOSIS — D649 Anemia, unspecified: Secondary | ICD-10-CM

## 2014-07-10 DIAGNOSIS — C9110 Chronic lymphocytic leukemia of B-cell type not having achieved remission: Secondary | ICD-10-CM

## 2014-07-10 LAB — CBC & DIFF AND RETIC
BASO%: 0.9 % (ref 0.0–2.0)
Basophils Absolute: 0.8 10*3/uL — ABNORMAL HIGH (ref 0.0–0.1)
EOS ABS: 0.4 10*3/uL (ref 0.0–0.5)
EOS%: 0.4 % (ref 0.0–7.0)
HCT: 32.8 % — ABNORMAL LOW (ref 34.8–46.6)
HGB: 10.1 g/dL — ABNORMAL LOW (ref 11.6–15.9)
Immature Retic Fract: 5 % (ref 1.60–10.00)
LYMPH%: 88.5 % — ABNORMAL HIGH (ref 14.0–49.7)
MCH: 29.4 pg (ref 25.1–34.0)
MCHC: 30.9 g/dL — ABNORMAL LOW (ref 31.5–36.0)
MCV: 95.1 fL (ref 79.5–101.0)
MONO#: 0.5 10*3/uL (ref 0.1–0.9)
MONO%: 0.6 % (ref 0.0–14.0)
NEUT%: 9.6 % — ABNORMAL LOW (ref 38.4–76.8)
NEUTROS ABS: 8.4 10*3/uL — AB (ref 1.5–6.5)
Platelets: 357 10*3/uL (ref 145–400)
RBC: 3.45 10*6/uL — AB (ref 3.70–5.45)
RDW: 14.5 % (ref 11.2–14.5)
RETIC %: 1.04 % (ref 0.70–2.10)
RETIC CT ABS: 35.88 10*3/uL (ref 33.70–90.70)
WBC: 87.7 10*3/uL (ref 3.9–10.3)
lymph#: 77.6 10*3/uL — ABNORMAL HIGH (ref 0.9–3.3)

## 2014-07-10 LAB — COMPREHENSIVE METABOLIC PANEL (CC13)
ALK PHOS: 100 U/L (ref 40–150)
ALT: 10 U/L (ref 0–55)
AST: 21 U/L (ref 5–34)
Albumin: 4 g/dL (ref 3.5–5.0)
Anion Gap: 13 mEq/L — ABNORMAL HIGH (ref 3–11)
BILIRUBIN TOTAL: 0.27 mg/dL (ref 0.20–1.20)
BUN: 52 mg/dL — AB (ref 7.0–26.0)
CO2: 22 mEq/L (ref 22–29)
CREATININE: 2.4 mg/dL — AB (ref 0.6–1.1)
Calcium: 9.4 mg/dL (ref 8.4–10.4)
Chloride: 108 mEq/L (ref 98–109)
Glucose: 154 mg/dl — ABNORMAL HIGH (ref 70–140)
Potassium: 4.3 mEq/L (ref 3.5–5.1)
Sodium: 143 mEq/L (ref 136–145)
Total Protein: 7.3 g/dL (ref 6.4–8.3)

## 2014-07-10 LAB — TECHNOLOGIST REVIEW

## 2014-07-10 LAB — LACTATE DEHYDROGENASE (CC13): LDH: 477 U/L — AB (ref 125–245)

## 2014-07-11 LAB — FERRITIN CHCC: Ferritin: 59 ng/ml (ref 9–269)

## 2014-07-12 LAB — BETA 2 MICROGLOBULIN, SERUM: BETA 2 MICROGLOBULIN: 12.8 mg/L — AB (ref <=2.51)

## 2014-07-12 LAB — ERYTHROPOIETIN: Erythropoietin: 18.9 m[IU]/mL — ABNORMAL HIGH (ref 2.6–18.5)

## 2014-07-12 LAB — FOLATE RBC: RBC FOLATE: 1095 ng/mL (ref >280)

## 2014-07-12 LAB — VITAMIN B12: Vitamin B-12: 342 pg/mL (ref 211–911)

## 2014-08-07 ENCOUNTER — Ambulatory Visit (HOSPITAL_BASED_OUTPATIENT_CLINIC_OR_DEPARTMENT_OTHER): Payer: Medicare Other | Admitting: Oncology

## 2014-08-07 ENCOUNTER — Other Ambulatory Visit (HOSPITAL_BASED_OUTPATIENT_CLINIC_OR_DEPARTMENT_OTHER): Payer: Medicare Other

## 2014-08-07 ENCOUNTER — Telehealth: Payer: Self-pay | Admitting: Oncology

## 2014-08-07 VITALS — BP 162/41 | HR 70 | Temp 98.1°F | Resp 18 | Ht <= 58 in | Wt 141.0 lb

## 2014-08-07 DIAGNOSIS — E119 Type 2 diabetes mellitus without complications: Secondary | ICD-10-CM

## 2014-08-07 DIAGNOSIS — C9110 Chronic lymphocytic leukemia of B-cell type not having achieved remission: Secondary | ICD-10-CM

## 2014-08-07 DIAGNOSIS — D638 Anemia in other chronic diseases classified elsewhere: Secondary | ICD-10-CM

## 2014-08-07 DIAGNOSIS — N189 Chronic kidney disease, unspecified: Secondary | ICD-10-CM

## 2014-08-07 DIAGNOSIS — N183 Chronic kidney disease, stage 3 unspecified: Secondary | ICD-10-CM

## 2014-08-07 DIAGNOSIS — M19019 Primary osteoarthritis, unspecified shoulder: Secondary | ICD-10-CM

## 2014-08-07 DIAGNOSIS — D649 Anemia, unspecified: Secondary | ICD-10-CM

## 2014-08-07 LAB — CBC & DIFF AND RETIC
BASO%: 0.6 % (ref 0.0–2.0)
BASOS ABS: 0.5 10*3/uL — AB (ref 0.0–0.1)
EOS%: 0.5 % (ref 0.0–7.0)
Eosinophils Absolute: 0.4 10*3/uL (ref 0.0–0.5)
HCT: 31.5 % — ABNORMAL LOW (ref 34.8–46.6)
HEMOGLOBIN: 9.6 g/dL — AB (ref 11.6–15.9)
Immature Retic Fract: 5.9 % (ref 1.60–10.00)
LYMPH%: 88.7 % — AB (ref 14.0–49.7)
MCH: 29.1 pg (ref 25.1–34.0)
MCHC: 30.6 g/dL — ABNORMAL LOW (ref 31.5–36.0)
MCV: 95.3 fL (ref 79.5–101.0)
MONO#: 0.8 10*3/uL (ref 0.1–0.9)
MONO%: 1 % (ref 0.0–14.0)
NEUT#: 7 10*3/uL — ABNORMAL HIGH (ref 1.5–6.5)
NEUT%: 9.2 % — ABNORMAL LOW (ref 38.4–76.8)
PLATELETS: 359 10*3/uL (ref 145–400)
RBC: 3.31 10*6/uL — AB (ref 3.70–5.45)
RDW: 14.6 % — AB (ref 11.2–14.5)
RETIC CT ABS: 43.03 10*3/uL (ref 33.70–90.70)
Retic %: 1.3 % (ref 0.70–2.10)
WBC: 75.9 10*3/uL (ref 3.9–10.3)
lymph#: 67.3 10*3/uL — ABNORMAL HIGH (ref 0.9–3.3)

## 2014-08-07 LAB — TECHNOLOGIST REVIEW

## 2014-08-07 MED ORDER — TRAMADOL-ACETAMINOPHEN 37.5-325 MG PO TABS: 1.0000 | ORAL_TABLET | Freq: Four times a day (QID) | ORAL | Status: DC | PRN

## 2014-08-07 NOTE — Telephone Encounter (Signed)
gv pt appt schedule for dec 2015 thru aug 2016.

## 2014-08-07 NOTE — Progress Notes (Signed)
ID: [name]   DOB: Feb 09, 1921  MR#: [id number]  CSN#:[id number]  PCP: Blain Pais, MD GYN: SU:  OTHER MD: Susa Day    INTERVAL HISTORY: [name] returns today with her husband [name] for followup of her chronic lymphoid leukemia. The interval history is unremarkable. She is doing "okay". She mostly stays around the house, does all her housework and cooking and "I have a good helper". Her husband [name] tells me he is "trying to fix this cycles", by which he means that he keeps remembering things that happen in the past and trying to fix them. Some mild this process has helped him pull himself out of his depression. He looks considerably less depressed this visit that he did last period  REVIEW OF SYSTEMS: [name]'s worse problem is pain in her left knee. She has no drenching sweats, no fevers, no rash, no bleeding, is not aware of any adenopathy, and denies unexplained fatigue or weight loss. She does have stress urinary incontinence as a secondary problem. A detailed review of systems today was otherwise stable  PAST MEDICAL HISTORY: Past Medical History  Diagnosis Date  . Diabetes mellitus   . Hyperlipidemia   . Hypertension   . Carotid stenosis, right 08/2005    40-60%  . History of colonic polyps 2002    Normal colonoscopy 08/2004  . CKD (chronic kidney disease) stage 2, GFR 60-89 ml/min   . CLL (chronic lymphocytic leukemia)     Dr. Jana Hampton. s/p Rituxan  . Pulse irregularity     Etiology->frequent PACs, normal EKG    FAMILY HISTORY No family history on file.  GYNECOLOGIC HISTORY: GX P0  SOCIAL HISTORY: She is originally from [place]. 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 They have no children. They are Catholic.  ADVANCED DIRECTIVES: in place  HEALTH MAINTENANCE: History  Substance Use Topics  . Smoking status: Former Research scientist (life sciences)  . Smokeless tobacco: Not on file  . Alcohol Use: Not on file     Colonoscopy: Buccini  PAP:  Bone  density:  Lipid panel:  No Known Allergies  Current Outpatient Prescriptions  Medication Sig Dispense Refill  . acetaminophen (TYLENOL) 500 MG tablet Take 1,000 mg by mouth 2 (two) times daily as needed. For pain      . AGGRENOX 25-200 MG per 12 hr capsule TAKE ONE CAPSULE BY MOUTH TWICE A DAY  200 capsule  2  . amLODipine (NORVASC) 10 MG tablet Take 1 tablet (10 mg total) by mouth daily.  30 tablet  11  . Calcium Carbonate-Vitamin D (CALCIUM PLUS VITAMIN D PO) Take 1 tablet by mouth 2 (two) times daily.      . Cranberry 450 MG CAPS Take 1 capsule by mouth daily.      . furosemide (LASIX) 40 MG tablet Take 1 tablet (40 mg total) by mouth daily.  90 tablet  6  . losartan (COZAAR) 100 MG tablet TAKE 1 TABLET BY MOUTH EVERY DAY  100 tablet  3  . Multiple Vitamins-Minerals (MACULAR VITAMIN BENEFIT PO) Take 1 tablet by mouth 2 (two) times daily.      . Multiple Vitamins-Minerals (MULTIVITAMINS THER. W/MINERALS) TABS Take 1 tablet by mouth daily.      . Omega-3 Fatty Acids (FISH OIL) 1200 MG CAPS Take 1 capsule by mouth daily.      [name] omeprazole (PRILOSEC) 20 MG capsule Take 1 capsule (20 mg total) by mouth daily.  100 capsule  12  . Polyethyl  Glycol-Propyl Glycol (SYSTANE OP) Place 1 drop into both eyes 2 (two) times daily.      . simvastatin (ZOCOR) 20 MG tablet TAKE 1 TABLET BY MOUTH EVERY DAY AT BEDTIME  100 tablet  2  . sodium chloride (OCEAN NASAL SPRAY) 0.65 % nasal spray Place 1 spray into the nose as needed for congestion (You may use it 2-3 times per day.).  45 mL  3  . traMADol-acetaminophen (ULTRACET) 37.5-325 MG per tablet Take 1-2 tablets by mouth every 6 (six) hours as needed for moderate pain.  30 tablet  3   No current facility-administered medications for this visit.    OBJECTIVE: Elderly white woman examined in a wheelchair Filed Vitals:   08/07/14 1303  BP: 162/41  Pulse: 70  Temp: 98.1 F (36.7 C)  Resp: 18     Body mass index is 32.77 kg/(Hampton^2).    ECOG FS:  2  Sclerae unicteric, EOMs intact Oropharynx clear, no thrush or other lesions No cervical, supraclavicular, or axillary adenopathy Lungs no rales or rhonchi Heart regular rate and rhythm Abd soft, nontender, obese, positive bowel sounds, no splenomegaly MSK no focal spinal tenderness, no peripheral edema; Neuro: nonfocal, well oriented, pleasant affect   LAB RESULTS: Lab Results  Component Value Date   WBC 75.9* 08/07/2014   NEUTROABS 7.0* 08/07/2014   HGB 9.6* 08/07/2014   HCT 31.5* 08/07/2014   MCV 95.3 08/07/2014   PLT 359 08/07/2014      Chemistry      Component Value Date/Time   NA 143 07/10/2014 1447   NA 136 08/21/2013 1429   K 4.3 07/10/2014 1447   K 4.6 08/21/2013 1429   CL 95* 08/21/2013 1429   CL 103 04/18/2013 1422   CO2 22 07/10/2014 1447   CO2 25 08/21/2013 1429   BUN 52.0* 07/10/2014 1447   BUN 47* 08/21/2013 1429   CREATININE 2.4* 07/10/2014 1447   CREATININE 2.06* 08/21/2013 1429   CREATININE 1.89* 06/28/2012 1425      Component Value Date/Time   CALCIUM 9.4 07/10/2014 1447   CALCIUM 9.3 08/21/2013 1429   CALCIUM 10.5 08/07/2008 0000   ALKPHOS 100 07/10/2014 1447   ALKPHOS 77 06/28/2012 1425   AST 21 07/10/2014 1447   AST 15 06/28/2012 1425   ALT 10 07/10/2014 1447   ALT 10 06/28/2012 1425   BILITOT 0.27 07/10/2014 1447   BILITOT 0.4 06/28/2012 1425     Results for SCHUBACH, [name] (MRN [id number]) as of 08/07/2014 12:55  Ref. Range 05/21/2006 14:10 05/17/2012 14:53 07/10/2014 14:47  Vitamin B-12 Latest Range: 211-911 pg/mL 583 686 342    No results found for this basename: LABCA2    No components found with this basename: LABCA125    No results found for this basename: INR,  in the last 168 hours  Urinalysis No results found for this basename: colorurine,  appearanceur,  labspec,  phurine,  glucoseu,  hgbur,  bilirubinur,  ketonesur,  proteinur,  urobilinogen,  nitrite,  leukocytesur   Results for Tracy, Hampton (MRN [id number]) as of 08/07/2014 12:55  Ref. Range  05/21/2006 14:10 05/17/2012 14:53 07/10/2014 14:47  Ferritin Latest Range: 10-291 ng/mL 63  59  Folate No range found  >20.0   RBC Folate Latest Range: >280 ng/mL 698 (H)  1095   STUDIES: No results found.  ASSESSMENT: 80 y.o.  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  Status post treatment with Rituxan in 2004 and 2007, with no treatment after November 2007.   (2) anemia, possibly due to CLL progression or epo deficiency with chronic renal insufficiency     PLAN:  Amillya 's white cell count is slightly lower. Her anemia is stable. We checked your B12 folate and ferritin a month ago and they were all normal. Her reticulocyte count is inappropriately low, consistent with anemia of chronic illness. The only borderline reading there was the ferritin, which was less than 50.  At this point I really think no intervention is necessary. We're going to go back to checking her labwork every 3 months, beginning in December. She will see me again in 6 months. If there is a significant drop in the hemoglobin we will proceed to Aranesp and feraheme. At this point I don't see much need for an intervention in terms of her CLL, but we can always try right toxin in one dose every month or so for control if necessary.  It is good to see that her husbands depression is improved.  Mesha Schamberger C    08/07/2014

## 2014-10-08 ENCOUNTER — Ambulatory Visit (INDEPENDENT_AMBULATORY_CARE_PROVIDER_SITE_OTHER): Payer: Medicare Other | Admitting: Internal Medicine

## 2014-10-08 ENCOUNTER — Encounter: Payer: Self-pay | Admitting: Internal Medicine

## 2014-10-08 VITALS — BP 165/59 | HR 83 | Temp 97.9°F | Wt 143.0 lb

## 2014-10-08 DIAGNOSIS — I10 Essential (primary) hypertension: Secondary | ICD-10-CM | POA: Diagnosis not present

## 2014-10-08 DIAGNOSIS — R269 Unspecified abnormalities of gait and mobility: Secondary | ICD-10-CM | POA: Diagnosis not present

## 2014-10-08 DIAGNOSIS — E119 Type 2 diabetes mellitus without complications: Secondary | ICD-10-CM

## 2014-10-08 DIAGNOSIS — Z7189 Other specified counseling: Secondary | ICD-10-CM | POA: Diagnosis not present

## 2014-10-08 LAB — POCT GLYCOSYLATED HEMOGLOBIN (HGB A1C): Hemoglobin A1C: 6.5

## 2014-10-08 LAB — GLUCOSE, CAPILLARY: Glucose-Capillary: 156 mg/dL — ABNORMAL HIGH (ref 70–99)

## 2014-10-08 NOTE — Assessment & Plan Note (Signed)
Referred to [facility] RN for home assessment for equipment needs and medication management.

## 2014-10-08 NOTE — Assessment & Plan Note (Signed)
She again forgot the MOST form but is inclined to say that she does not want CPR "with her age" but will further discuss things with her husband.

## 2014-10-08 NOTE — Progress Notes (Signed)
Patient ID: [name], female   DOB: [birth year], 80 y.o.   MRN: [id number] Case discussed with Dr. Hayes Ludwig soon after the resident saw the patient.  We reviewed the resident's history and exam and pertinent patient test results.  I agree with the assessment, diagnosis, and plan of care documented in the resident's note.

## 2014-10-08 NOTE — Progress Notes (Signed)
   Subjective:    Patient ID: FARIN BUHMAN, female    DOB: [birth year], 80 y.o.   MRN: [id number]  HPI Ms. Deuser is a pleasant 80 year old woman with PMH of HTN, CKD stage 3, stable CLL, who presents for HTN follow up and for medication refills. She has no complains except that she feels forgetful at times. She continues to use a cane to stabilized her gait. No falls.    Review of Systems  Constitutional: Negative for fever, chills, diaphoresis, activity change, appetite change, fatigue and unexpected weight change.  Respiratory: Positive for cough. Negative for shortness of breath.   Cardiovascular: Negative for chest pain, palpitations and leg swelling.  Gastrointestinal: Negative for abdominal pain.  Genitourinary: Negative for dysuria and difficulty urinating.  Musculoskeletal: Positive for gait problem.  Neurological: Negative for dizziness and light-headedness.  Psychiatric/Behavioral: Negative for agitation.       Objective:   Physical Exam  Constitutional: She is oriented to person, place, and time. She appears well-developed and well-nourished. No distress.  Sitting in wheelchair   Cardiovascular: Normal rate and regular rhythm.   Pulmonary/Chest: Effort normal and breath sounds normal. No respiratory distress. She has no wheezes. She has no rales.  Abdominal: Soft. There is no tenderness.  Musculoskeletal: She exhibits no edema.  Neurological: She is alert and oriented to person, place, and time. Coordination abnormal.  Skin: Skin is warm and dry. She is not diaphoretic.  Psychiatric: She has a normal mood and affect. Her behavior is normal.  Nursing note and vitals reviewed.         Assessment & Plan:

## 2014-10-08 NOTE — Patient Instructions (Signed)
General Instructions: -You have been referred to a home visit with a nurse. They will call you to set the date and time.  -Follow up in 3 months.  -Have a wonderful Holiday Season!  [place] [employer] and [employer] antecipado!  Please bring your medicines with you each time you come to clinic.  Medicines may include prescription medications, over-the-counter medications, herbal remedies, eye drops, vitamins, or other pills.   Progress Toward Treatment Goals:  Treatment Goal 10/08/2014  Hemoglobin A1C at goal  Blood pressure improved    Self Care Goals & Plans:  Self Care Goal 10/08/2014  Manage my medications take my medicines as prescribed; bring my medications to every visit; refill my medications on time  Monitor my health -  Eat healthy foods -  Be physically active -  Meeting treatment goals maintain the current self-care plan    Home Blood Glucose Monitoring 10/08/2014  Check my blood sugar no home glucose monitoring     Care Management & Community Referrals:  Referral 10/08/2014  Referrals made for care management support other (see comment)  Referrals made to community resources -

## 2014-10-08 NOTE — Assessment & Plan Note (Signed)
Lab Results  Component Value Date   HGBA1C 6.5 10/08/2014   HGBA1C 6.9 04/30/2014   HGBA1C 6.4 12/25/2013     Assessment: Diabetes control: good control (HgbA1C at goal) Progress toward A1C goal:  at goal Comments: controlled on diet alone  Plan: Medications:  continue diet control Home glucose monitoring: Frequency: no home glucose monitoring Timing:   Instruction/counseling given: discussed diet Educational resources provided:   Self management tools provided:   Other plans: Follow in 3 months. Referred to [facility] RN

## 2014-10-08 NOTE — Assessment & Plan Note (Signed)
BP Readings from Last 3 Encounters:  10/08/14 165/59  08/07/14 162/41  07/02/14 125/63    Lab Results  Component Value Date   NA 143 07/10/2014   K 4.3 07/10/2014   CREATININE 2.4* 07/10/2014    Assessment: Blood pressure control: mildly elevated Progress toward BP goal:  improved Comments: She is on Lasix 40mg  daily, amlodipine 10mg  daily, losartan 100mg  daily. She sometimes forgets to take some of her medications. BP only slightly elevated today  Plan: Medications:  continue current medications Educational resources provided:   Self management tools provided:   Other plans: Follow up in 3 months. [facility] RN visit ordered

## 2014-10-15 ENCOUNTER — Other Ambulatory Visit: Payer: Self-pay | Admitting: Internal Medicine

## 2014-10-15 ENCOUNTER — Telehealth: Payer: Self-pay | Admitting: Licensed Clinical Social Worker

## 2014-10-15 DIAGNOSIS — R269 Unspecified abnormalities of gait and mobility: Secondary | ICD-10-CM

## 2014-10-15 NOTE — Telephone Encounter (Signed)
[name] was referred to for [facility] RN services.  CSW placed call to [name] and spouse answered phone.  Spouse states they have used [place] services in the past but they do not recall the name of the agency.  Spouse has no preference of agency only requesting afternoon calls/visits due to [name] not sleeping well at night and sleeps in late.  Referral made to [facility].

## 2014-10-25 ENCOUNTER — Other Ambulatory Visit (HOSPITAL_BASED_OUTPATIENT_CLINIC_OR_DEPARTMENT_OTHER): Payer: Medicare Other

## 2014-10-25 DIAGNOSIS — D638 Anemia in other chronic diseases classified elsewhere: Secondary | ICD-10-CM

## 2014-10-25 DIAGNOSIS — E119 Type 2 diabetes mellitus without complications: Secondary | ICD-10-CM

## 2014-10-25 DIAGNOSIS — M19019 Primary osteoarthritis, unspecified shoulder: Secondary | ICD-10-CM

## 2014-10-25 DIAGNOSIS — N183 Chronic kidney disease, stage 3 unspecified: Secondary | ICD-10-CM

## 2014-10-25 DIAGNOSIS — C9110 Chronic lymphocytic leukemia of B-cell type not having achieved remission: Secondary | ICD-10-CM

## 2014-10-25 LAB — CBC & DIFF AND RETIC
BASO%: 0.6 % (ref 0.0–2.0)
BASOS ABS: 0.6 10*3/uL — AB (ref 0.0–0.1)
EOS%: 0.4 % (ref 0.0–7.0)
Eosinophils Absolute: 0.4 10*3/uL (ref 0.0–0.5)
HCT: 31.2 % — ABNORMAL LOW (ref 34.8–46.6)
HGB: 9.4 g/dL — ABNORMAL LOW (ref 11.6–15.9)
Immature Retic Fract: 8.2 % (ref 1.60–10.00)
LYMPH%: 91 % — ABNORMAL HIGH (ref 14.0–49.7)
MCH: 29.2 pg (ref 25.1–34.0)
MCHC: 30.1 g/dL — ABNORMAL LOW (ref 31.5–36.0)
MCV: 97.1 fL (ref 79.5–101.0)
MONO#: 1 10*3/uL — ABNORMAL HIGH (ref 0.1–0.9)
MONO%: 0.9 % (ref 0.0–14.0)
NEUT#: 7.5 10*3/uL — ABNORMAL HIGH (ref 1.5–6.5)
NEUT%: 7.1 % — ABNORMAL LOW (ref 38.4–76.8)
Platelets: 401 10*3/uL — ABNORMAL HIGH (ref 145–400)
RBC: 3.22 10*6/uL — AB (ref 3.70–5.45)
RDW: 14 % (ref 11.2–14.5)
RETIC %: 1.34 % (ref 0.70–2.10)
RETIC CT ABS: 43.15 10*3/uL (ref 33.70–90.70)
WBC: 105.9 10*3/uL (ref 3.9–10.3)
lymph#: 96.4 10*3/uL — ABNORMAL HIGH (ref 0.9–3.3)

## 2014-10-25 LAB — TECHNOLOGIST REVIEW

## 2014-10-26 LAB — FERRITIN CHCC: FERRITIN: 65 ng/mL (ref 9–269)

## 2014-10-29 ENCOUNTER — Other Ambulatory Visit: Payer: Self-pay | Admitting: Oncology

## 2014-11-20 ENCOUNTER — Ambulatory Visit: Payer: Medicare Other | Admitting: Oncology

## 2014-11-20 ENCOUNTER — Other Ambulatory Visit: Payer: Medicare Other

## 2014-11-20 NOTE — Progress Notes (Signed)
No how

## 2014-11-21 ENCOUNTER — Telehealth: Payer: Self-pay | Admitting: Oncology

## 2014-11-21 NOTE — Telephone Encounter (Signed)
per pof to sch pt appt-sent MW email to sch pt trmt-pt to get update trmt 12/31

## 2014-11-21 NOTE — Telephone Encounter (Signed)
interper serv cld pt to give appt time & date-message was left on vm by interperer

## 2014-12-03 ENCOUNTER — Encounter: Payer: Self-pay | Admitting: Nurse Practitioner

## 2014-12-03 ENCOUNTER — Other Ambulatory Visit (HOSPITAL_BASED_OUTPATIENT_CLINIC_OR_DEPARTMENT_OTHER): Payer: Medicare Other

## 2014-12-03 ENCOUNTER — Ambulatory Visit (HOSPITAL_BASED_OUTPATIENT_CLINIC_OR_DEPARTMENT_OTHER): Payer: Medicare Other | Admitting: Nurse Practitioner

## 2014-12-03 VITALS — BP 168/58 | HR 110 | Temp 98.0°F | Resp 18 | Ht <= 58 in | Wt 140.5 lb

## 2014-12-03 DIAGNOSIS — N183 Chronic kidney disease, stage 3 unspecified: Secondary | ICD-10-CM

## 2014-12-03 DIAGNOSIS — C9110 Chronic lymphocytic leukemia of B-cell type not having achieved remission: Secondary | ICD-10-CM

## 2014-12-03 DIAGNOSIS — D638 Anemia in other chronic diseases classified elsewhere: Secondary | ICD-10-CM

## 2014-12-03 DIAGNOSIS — E119 Type 2 diabetes mellitus without complications: Secondary | ICD-10-CM

## 2014-12-03 DIAGNOSIS — D649 Anemia, unspecified: Secondary | ICD-10-CM

## 2014-12-03 DIAGNOSIS — N182 Chronic kidney disease, stage 2 (mild): Secondary | ICD-10-CM

## 2014-12-03 DIAGNOSIS — M19019 Primary osteoarthritis, unspecified shoulder: Secondary | ICD-10-CM

## 2014-12-03 LAB — CBC & DIFF AND RETIC
BASO%: 1.1 % (ref 0.0–2.0)
Basophils Absolute: 1.2 10*3/uL — ABNORMAL HIGH (ref 0.0–0.1)
EOS%: 0.4 % (ref 0.0–7.0)
Eosinophils Absolute: 0.4 10*3/uL (ref 0.0–0.5)
HEMATOCRIT: 31.3 % — AB (ref 34.8–46.6)
HGB: 9.4 g/dL — ABNORMAL LOW (ref 11.6–15.9)
IMMATURE RETIC FRACT: 7.4 % (ref 1.60–10.00)
LYMPH%: 90.7 % — ABNORMAL HIGH (ref 14.0–49.7)
MCH: 28.9 pg (ref 25.1–34.0)
MCHC: 29.9 g/dL — ABNORMAL LOW (ref 31.5–36.0)
MCV: 96.7 fL (ref 79.5–101.0)
MONO#: 0.8 10*3/uL (ref 0.1–0.9)
MONO%: 0.7 % (ref 0.0–14.0)
NEUT#: 7.9 10*3/uL — ABNORMAL HIGH (ref 1.5–6.5)
NEUT%: 7.1 % — ABNORMAL LOW (ref 38.4–76.8)
Platelets: 381 10*3/uL (ref 145–400)
RBC: 3.24 10*6/uL — AB (ref 3.70–5.45)
RDW: 14.1 % (ref 11.2–14.5)
RETIC %: 1.25 % (ref 0.70–2.10)
Retic Ct Abs: 40.5 10*3/uL (ref 33.70–90.70)
WBC: 110.8 10*3/uL (ref 3.9–10.3)
lymph#: 100.5 10*3/uL — ABNORMAL HIGH (ref 0.9–3.3)

## 2014-12-03 LAB — FERRITIN CHCC: Ferritin: 65 ng/ml (ref 9–269)

## 2014-12-03 LAB — TECHNOLOGIST REVIEW

## 2014-12-03 NOTE — Progress Notes (Signed)
ID: [name]   DOB: 11/09/1921  MR#: [id number]  CSN#:[id number]  PCP: Blain Pais, MD GYN: SU:  OTHER MD: Susa Day    INTERVAL HISTORY: [name] returns today with her husband [name] for followup of her chronic lymphoid leukemia. The interval history is unremarkable. She had a good holiday and "feels as good as can be expected" for her age. She denies any recent illnesses or infections. There are no changes in her medical history.  REVIEW OF SYSTEMS: [name] denies fevers, chill, nausea, vomiting, or changes in bowel habits. She has some stress urinary incontinence that is unchanged. Her appetite is healthy and so is her energy level. She denies headaches, dizziness, unexplained weight loss, night sweats, rashes or bleeding. She is a wheelchair today, but typically uses a cane to ambulate in public. At home she simply uses furniture. She has a mild dry cough, typically at night that resolves with a cough drop. She sleeps well. She denies shortness of breath, chest pain, or palpitations. A detailed review of system is negative   PAST MEDICAL HISTORY: Past Medical History  Diagnosis Date  . Diabetes mellitus   . Hyperlipidemia   . Hypertension   . Carotid stenosis, right 08/2005    40-60%  . History of colonic polyps 2002    Normal colonoscopy 08/2004  . CKD (chronic kidney disease) stage 2, GFR 60-89 ml/min   . CLL (chronic lymphocytic leukemia)     Dr. [name]. s/p Rituxan  . Pulse irregularity     Etiology->frequent PACs, normal EKG    FAMILY HISTORY No family history on file.  GYNECOLOGIC HISTORY: GX P0  SOCIAL HISTORY: She is originally from [place]. 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 They have no children. They are Catholic.  ADVANCED DIRECTIVES: in place  HEALTH MAINTENANCE: History  Substance Use Topics  . Smoking status: Former Research scientist (life sciences)  . Smokeless tobacco: Not on file  . Alcohol Use: Not on file     Colonoscopy:  Buccini  PAP:  Bone density:  Lipid panel:  No Known Allergies  Current Outpatient Prescriptions  Medication Sig Dispense Refill  . acetaminophen (TYLENOL) 500 MG tablet Take 1,000 mg by mouth 2 (two) times daily as needed. For pain    . AGGRENOX 25-200 MG per 12 hr capsule TAKE ONE CAPSULE BY MOUTH TWICE A DAY 200 capsule 2  . amLODipine (NORVASC) 10 MG tablet Take 1 tablet (10 mg total) by mouth daily. 30 tablet 11  . Calcium Carbonate-Vitamin D (CALCIUM PLUS VITAMIN D PO) Take 1 tablet by mouth 2 (two) times daily.    . Cranberry 450 MG CAPS Take 1 capsule by mouth daily.    . furosemide (LASIX) 40 MG tablet Take 1 tablet (40 mg total) by mouth daily. 90 tablet 6  . losartan (COZAAR) 100 MG tablet TAKE 1 TABLET BY MOUTH EVERY DAY 100 tablet 3  . Multiple Vitamins-Minerals (MACULAR VITAMIN BENEFIT PO) Take 1 tablet by mouth 2 (two) times daily.    . Multiple Vitamins-Minerals (MULTIVITAMINS THER. W/MINERALS) TABS Take 1 tablet by mouth daily.    . Omega-3 Fatty Acids (FISH OIL) 1200 MG CAPS Take 1 capsule by mouth daily.    [name] omeprazole (PRILOSEC) 20 MG capsule Take 1 capsule (20 mg total) by mouth daily. 100 capsule 12  . simvastatin (ZOCOR) 20 MG tablet TAKE 1 TABLET BY MOUTH EVERY DAY AT BEDTIME 100 tablet 2  . Polyethyl Glycol-Propyl Glycol (SYSTANE OP) Place  1 drop into both eyes 2 (two) times daily.    . sodium chloride (OCEAN NASAL SPRAY) 0.65 % nasal spray Place 1 spray into the nose as needed for congestion (You may use it 2-3 times per day.). (Patient not taking: Reported on 12/03/2014) 45 mL 3  . traMADol-acetaminophen (ULTRACET) 37.5-325 MG per tablet Take 1-2 tablets by mouth every 6 (six) hours as needed for moderate pain. (Patient not taking: Reported on 12/03/2014) 30 tablet 3   No current facility-administered medications for this visit.    OBJECTIVE: Elderly white woman examined in a wheelchair Filed Vitals:   12/03/14 1421  BP: 163/45  Pulse: 110  Temp: 98 F (36.7  C)  Resp: 18     Body mass index is 32.66 kg/(m^2).    ECOG FS: 2  Skin: warm, dry  HEENT: sclerae anicteric, conjunctivae pink, oropharynx clear. No thrush or mucositis.  Lymph Nodes: No cervical or supraclavicular lymphadenopathy  Lungs: clear to auscultation bilaterally, no rales, wheezes, or rhonci  Heart: regular rate and rhythm  Abdomen: round, soft, non tender, positive bowel sounds  Musculoskeletal: No focal spinal tenderness, no peripheral edema  Neuro: non focal, well oriented, positive affect   LAB RESULTS: Lab Results  Component Value Date   WBC 110.8* 12/03/2014   NEUTROABS 7.9* 12/03/2014   HGB 9.4* 12/03/2014   HCT 31.3* 12/03/2014   MCV 96.7 12/03/2014   PLT 381 12/03/2014      Chemistry      Component Value Date/Time   NA 143 07/10/2014 1447   NA 136 08/21/2013 1429   K 4.3 07/10/2014 1447   K 4.6 08/21/2013 1429   CL 95* 08/21/2013 1429   CL 103 04/18/2013 1422   CO2 22 07/10/2014 1447   CO2 25 08/21/2013 1429   BUN 52.0* 07/10/2014 1447   BUN 47* 08/21/2013 1429   CREATININE 2.4* 07/10/2014 1447   CREATININE 2.06* 08/21/2013 1429   CREATININE 1.89* 06/28/2012 1425      Component Value Date/Time   CALCIUM 9.4 07/10/2014 1447   CALCIUM 9.3 08/21/2013 1429   CALCIUM 10.5 08/07/2008 0000   ALKPHOS 100 07/10/2014 1447   ALKPHOS 77 06/28/2012 1425   AST 21 07/10/2014 1447   AST 15 06/28/2012 1425   ALT 10 07/10/2014 1447   ALT 10 06/28/2012 1425   BILITOT 0.27 07/10/2014 1447   BILITOT 0.4 06/28/2012 1425     Results for IVINS, Keith M (MRN [id number]) as of 08/07/2014 12:55  Ref. Range 05/21/2006 14:10 05/17/2012 14:53 07/10/2014 14:47  Vitamin B-12 Latest Range: 211-911 pg/mL 583 686 342    No results found for: LABCA2  No components found for: LABCA125  No results for input(s): INR in the last 168 hours.  Urinalysis No results found for: COLORURINE Results for SETH, FRIEDLANDER (MRN [id number]) as of 08/07/2014 12:55  Ref. Range  05/21/2006 14:10 05/17/2012 14:53 07/10/2014 14:47  Ferritin Latest Range: 10-291 ng/mL 63  59  Folate No range found  >20.0   RBC Folate Latest Range: >280 ng/mL 698 (H)  1095   STUDIES: No results found.  ASSESSMENT: 81 y.o.  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  Status post treatment with Rituxan in 2004 and 2007, with no treatment after November 2007.   (2) anemia, possibly due to CLL progression or epo deficiency with chronic renal insufficiency     PLAN:  [name] is  doing well today. The labs were reviewed in detail and were entirely stable. Her neutrophils are just slightly higher at 7.9, but her hgb is exactly the same at 9.4. The ferritin level is still pending. These results were reviewed with Dr. [name].   [name] will return as scheduled in 2 months with lab  And an office visit. Per Dr. Virgie Dad last note: "If there is a significant drop in the hemoglobin we will proceed to Aranesp and feraheme. At this point I don't see much need for an intervention in terms of her CLL, but we can always try rituxin in one dose every month or so for control if necessary." [name] understands and agrees with this plan. She has been encouraged to call with any issues that might arise before her next visit here.   Marcelino Duster    12/03/2014

## 2014-12-13 ENCOUNTER — Other Ambulatory Visit: Payer: Self-pay | Admitting: Internal Medicine

## 2015-01-10 ENCOUNTER — Other Ambulatory Visit: Payer: Self-pay | Admitting: Internal Medicine

## 2015-01-17 ENCOUNTER — Other Ambulatory Visit (HOSPITAL_BASED_OUTPATIENT_CLINIC_OR_DEPARTMENT_OTHER): Payer: Medicare Other

## 2015-01-17 DIAGNOSIS — C9110 Chronic lymphocytic leukemia of B-cell type not having achieved remission: Secondary | ICD-10-CM

## 2015-01-17 DIAGNOSIS — E119 Type 2 diabetes mellitus without complications: Secondary | ICD-10-CM

## 2015-01-17 DIAGNOSIS — D638 Anemia in other chronic diseases classified elsewhere: Secondary | ICD-10-CM

## 2015-01-17 DIAGNOSIS — N183 Chronic kidney disease, stage 3 unspecified: Secondary | ICD-10-CM

## 2015-01-17 DIAGNOSIS — M19019 Primary osteoarthritis, unspecified shoulder: Secondary | ICD-10-CM

## 2015-01-17 LAB — CBC & DIFF AND RETIC
BASO%: 0.9 % (ref 0.0–2.0)
Basophils Absolute: 1.2 10*3/uL — ABNORMAL HIGH (ref 0.0–0.1)
EOS ABS: 0.5 10*3/uL (ref 0.0–0.5)
EOS%: 0.3 % (ref 0.0–7.0)
HCT: 31.8 % — ABNORMAL LOW (ref 34.8–46.6)
HGB: 9.2 g/dL — ABNORMAL LOW (ref 11.6–15.9)
Immature Retic Fract: 6.1 % (ref 1.60–10.00)
LYMPH%: 92 % — AB (ref 14.0–49.7)
MCH: 27.8 pg (ref 25.1–34.0)
MCHC: 29 g/dL — AB (ref 31.5–36.0)
MCV: 95.7 fL (ref 79.5–101.0)
MONO#: 1.7 10*3/uL — ABNORMAL HIGH (ref 0.1–0.9)
MONO%: 1.3 % (ref 0.0–14.0)
NEUT#: 7.2 10*3/uL — ABNORMAL HIGH (ref 1.5–6.5)
NEUT%: 5.5 % — ABNORMAL LOW (ref 38.4–76.8)
PLATELETS: 378 10*3/uL (ref 145–400)
RBC: 3.33 10*6/uL — ABNORMAL LOW (ref 3.70–5.45)
RDW: 14.6 % — ABNORMAL HIGH (ref 11.2–14.5)
RETIC CT ABS: 35.63 10*3/uL (ref 33.70–90.70)
Retic %: 1.07 % (ref 0.70–2.10)
WBC: 131.6 10*3/uL (ref 3.9–10.3)
lymph#: 121 10*3/uL — ABNORMAL HIGH (ref 0.9–3.3)

## 2015-01-17 LAB — TECHNOLOGIST REVIEW

## 2015-01-18 ENCOUNTER — Other Ambulatory Visit: Payer: Self-pay | Admitting: Nurse Practitioner

## 2015-01-18 LAB — FERRITIN CHCC: Ferritin: 59 ng/ml (ref 9–269)

## 2015-02-07 ENCOUNTER — Ambulatory Visit (HOSPITAL_BASED_OUTPATIENT_CLINIC_OR_DEPARTMENT_OTHER): Payer: Medicare Other | Admitting: Oncology

## 2015-02-07 ENCOUNTER — Other Ambulatory Visit (HOSPITAL_BASED_OUTPATIENT_CLINIC_OR_DEPARTMENT_OTHER): Payer: Medicare Other

## 2015-02-07 ENCOUNTER — Telehealth: Payer: Self-pay | Admitting: Oncology

## 2015-02-07 VITALS — BP 159/66 | HR 82 | Temp 98.0°F | Resp 16 | Ht <= 58 in | Wt 136.9 lb

## 2015-02-07 DIAGNOSIS — D649 Anemia, unspecified: Secondary | ICD-10-CM | POA: Insufficient documentation

## 2015-02-07 DIAGNOSIS — D631 Anemia in chronic kidney disease: Secondary | ICD-10-CM

## 2015-02-07 DIAGNOSIS — N189 Chronic kidney disease, unspecified: Secondary | ICD-10-CM | POA: Diagnosis not present

## 2015-02-07 DIAGNOSIS — D638 Anemia in other chronic diseases classified elsewhere: Secondary | ICD-10-CM

## 2015-02-07 DIAGNOSIS — I10 Essential (primary) hypertension: Secondary | ICD-10-CM

## 2015-02-07 DIAGNOSIS — E119 Type 2 diabetes mellitus without complications: Secondary | ICD-10-CM

## 2015-02-07 DIAGNOSIS — N183 Chronic kidney disease, stage 3 unspecified: Secondary | ICD-10-CM

## 2015-02-07 DIAGNOSIS — C9110 Chronic lymphocytic leukemia of B-cell type not having achieved remission: Secondary | ICD-10-CM | POA: Diagnosis not present

## 2015-02-07 DIAGNOSIS — M19019 Primary osteoarthritis, unspecified shoulder: Secondary | ICD-10-CM

## 2015-02-07 LAB — CBC & DIFF AND RETIC
BASO%: 0.3 % (ref 0.0–2.0)
Basophils Absolute: 0.4 10*3/uL — ABNORMAL HIGH (ref 0.0–0.1)
EOS%: 0.4 % (ref 0.0–7.0)
Eosinophils Absolute: 0.5 10*3/uL (ref 0.0–0.5)
HCT: 30.4 % — ABNORMAL LOW (ref 34.8–46.6)
HEMOGLOBIN: 8.8 g/dL — AB (ref 11.6–15.9)
IMMATURE RETIC FRACT: 8.6 % (ref 1.60–10.00)
LYMPH#: 125.3 10*3/uL — AB (ref 0.9–3.3)
LYMPH%: 90.9 % — ABNORMAL HIGH (ref 14.0–49.7)
MCH: 28.1 pg (ref 25.1–34.0)
MCHC: 28.9 g/dL — ABNORMAL LOW (ref 31.5–36.0)
MCV: 97 fL (ref 79.5–101.0)
MONO#: 1.1 10*3/uL — AB (ref 0.1–0.9)
MONO%: 0.8 % (ref 0.0–14.0)
NEUT#: 10.4 10*3/uL — ABNORMAL HIGH (ref 1.5–6.5)
NEUT%: 7.6 % — AB (ref 38.4–76.8)
Platelets: 401 10*3/uL — ABNORMAL HIGH (ref 145–400)
RBC: 3.14 10*6/uL — AB (ref 3.70–5.45)
RDW: 14.6 % — AB (ref 11.2–14.5)
Retic %: 1.09 % (ref 0.70–2.10)
Retic Ct Abs: 34.23 10*3/uL (ref 33.70–90.70)
WBC: 137.9 10*3/uL — AB (ref 3.9–10.3)

## 2015-02-07 LAB — TECHNOLOGIST REVIEW

## 2015-02-07 MED ORDER — ALLOPURINOL 100 MG PO TABS: 100.0000 mg | ORAL_TABLET | Freq: Every day | ORAL | Status: DC

## 2015-02-07 MED ORDER — DARBEPOETIN ALFA 200 MCG/0.4ML IJ SOSY
200.0000 ug | PREFILLED_SYRINGE | Freq: Once | INTRAMUSCULAR | Status: AC
  Administered 2015-02-07: 200 ug via SUBCUTANEOUS
  Filled 2015-02-07: qty 0.4

## 2015-02-07 NOTE — Progress Notes (Signed)
ID: [name]   DOB: [birth year]  MR#: [id number]  CSN#:[id number]  PCP: Blain Pais, MD GYN: SU:  OTHER MD: Susa Day  CHIEF COMPLAINT: Chronic lymphoid leukemia, anemia of renal failure  CURRENT TREATMENT: Aranesp, rituximab  INTERVAL HISTORY: [name] returns today with her husband [name] for followup of her chronic lymphoid leukemia. The interval history is significant for her having some functional decline. She is in a wheelchair today. She does not use a wheelchair at home, but "holds onto things" as she walks from one place to the other. She denies fevers, drenching sweats, or bleeding. She admits to feeling tired. Her husband tells me she is sleeping 15 hours a day.  REVIEW OF SYSTEMS: [name]'s only complaint is that her right knee hurts. "Everything is fine". They're husband tells me in addition to her sleeping all the time she seems to be a little bit more confused. There have been no changes in bowel or bladder habits, no evidence of bleeding or rash. She denies cough phlegm production or pleurisy. A detailed review of systems today was otherwise stable.  PAST MEDICAL HISTORY: Past Medical History  Diagnosis Date  . Diabetes mellitus   . Hyperlipidemia   . Hypertension   . Carotid stenosis, right 08/2005    40-60%  . History of colonic polyps 2002    Normal colonoscopy 08/2004  . CKD (chronic kidney disease) stage 2, GFR 60-89 ml/min   . CLL (chronic lymphocytic leukemia)     Dr. [name]. s/p Rituxan  . Pulse irregularity     Etiology->frequent PACs, normal EKG    FAMILY HISTORY No family history on file.  GYNECOLOGIC HISTORY: GX P0  SOCIAL HISTORY: She is originally from [place]. 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 They have no children. They are Catholic.  ADVANCED DIRECTIVES: in place  HEALTH MAINTENANCE: History  Substance Use Topics  . Smoking status: Former Research scientist (life sciences)  . Smokeless tobacco: Not on file  . Alcohol Use: Not  on file     Colonoscopy: Buccini  PAP:  Bone density:  Lipid panel:  No Known Allergies  Current Outpatient Prescriptions  Medication Sig Dispense Refill  . acetaminophen (TYLENOL) 500 MG tablet Take 1,000 mg by mouth 2 (two) times daily as needed. For pain    . AGGRENOX 25-200 MG per 12 hr capsule TAKE ONE CAPSULE BY MOUTH TWICE A DAY 200 capsule 2  . amLODipine (NORVASC) 10 MG tablet Take 1 tablet (10 mg total) by mouth daily. 30 tablet 11  . Calcium Carbonate-Vitamin D (CALCIUM PLUS VITAMIN D PO) Take 1 tablet by mouth 2 (two) times daily.    . Cranberry 450 MG CAPS Take 1 capsule by mouth daily.    . furosemide (LASIX) 40 MG tablet TAKE 1 TABLET BY MOUTH EVERY DAY 90 tablet 3  . losartan (COZAAR) 100 MG tablet TAKE 1 TABLET BY MOUTH EVERY DAY 100 tablet 3  . Multiple Vitamins-Minerals (MACULAR VITAMIN BENEFIT PO) Take 1 tablet by mouth 2 (two) times daily.    . Multiple Vitamins-Minerals (MULTIVITAMINS THER. W/MINERALS) TABS Take 1 tablet by mouth daily.    . Omega-3 Fatty Acids (FISH OIL) 1200 MG CAPS Take 1 capsule by mouth daily.    [name] omeprazole (PRILOSEC) 20 MG capsule Take 1 capsule (20 mg total) by mouth daily. 100 capsule 12  . Polyethyl Glycol-Propyl Glycol (SYSTANE OP) Place 1 drop into both eyes 2 (two) times daily.    . simvastatin (ZOCOR)  20 MG tablet TAKE 1 TABLET BY MOUTH AT BEDTIME 100 tablet 1  . sodium chloride (OCEAN NASAL SPRAY) 0.65 % nasal spray Place 1 spray into the nose as needed for congestion (You may use it 2-3 times per day.). (Patient not taking: Reported on 12/03/2014) 45 mL 3  . traMADol-acetaminophen (ULTRACET) 37.5-325 MG per tablet Take 1-2 tablets by mouth every 6 (six) hours as needed for moderate pain. (Patient not taking: Reported on 12/03/2014) 30 tablet 3   No current facility-administered medications for this visit.    OBJECTIVE: Elderly white woman examined in a wheelchair Filed Vitals:   02/07/15 1510  BP: 159/66  Pulse: 82  Temp: 98 F  (36.7 C)  Resp: 16     Body mass index is 31.82 kg/(m^2).    ECOG FS: 3  Sclerae unicteric, EOMs intact Oropharynx clear and moist-- no thrush or other lesions No cervical or supraclavicular adenopathy, no axillary or inguinal adenopathy Lungs no rales or rhonchi Heart regular rate and rhythm Abd soft, obese, nontender, positive bowel sounds MSK no focal spinal tenderness, no joint swelling Neuro: nonfocal, well oriented, pleasant affect Breasts: Deferred    LAB RESULTS: Lab Results  Component Value Date   WBC 137.9* 02/07/2015   NEUTROABS 10.4* 02/07/2015   HGB 8.8* 02/07/2015   HCT 30.4* 02/07/2015   MCV 97.0 02/07/2015   PLT 401* 02/07/2015      Chemistry      Component Value Date/Time   NA 143 07/10/2014 1447   NA 136 08/21/2013 1429   K 4.3 07/10/2014 1447   K 4.6 08/21/2013 1429   CL 95* 08/21/2013 1429   CL 103 04/18/2013 1422   CO2 22 07/10/2014 1447   CO2 25 08/21/2013 1429   BUN 52.0* 07/10/2014 1447   BUN 47* 08/21/2013 1429   CREATININE 2.4* 07/10/2014 1447   CREATININE 2.06* 08/21/2013 1429   CREATININE 1.89* 06/28/2012 1425      Component Value Date/Time   CALCIUM 9.4 07/10/2014 1447   CALCIUM 9.3 08/21/2013 1429   CALCIUM 10.5 08/07/2008 0000   ALKPHOS 100 07/10/2014 1447   ALKPHOS 77 06/28/2012 1425   AST 21 07/10/2014 1447   AST 15 06/28/2012 1425   ALT 10 07/10/2014 1447   ALT 10 06/28/2012 1425   BILITOT 0.27 07/10/2014 1447   BILITOT 0.4 06/28/2012 1425       No results found for: LABCA2  No components found for: LABCA125  No results for input(s): INR in the last 168 hours.  Urinalysis No results found for: COLORURINE  STUDIES: No results found.   ASSESSMENT: 81 y.o.  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  Status post treatment with Rituxan in 2004 and 2007, with no treatment after November  2007.   (2) anemia, possibly due to CLL progression but certainly worsened by chronic renal insufficiency     (a) aranesp started 02/07/2015, to be repeated every 14 days, goal is Hb >10  (b) feraheme to be given 02/14/2015 and 02/21/2015  (3) rituximab to be started 02/14/2015, 6-8 weekly treatments planned, goal is WBC <10  (a) allopurinol started 02/07/2015  (b) hepatitis serologies to be drawn 02/11/2015  PLAN:  Carlissa is clearly symptomatic from her anemia, which is worsening. Although we have been reluctant to intervene given her age and other comorbid conditions, I think we have to go ahead and try to control her  CLL and also help her make more red cells.  We started Aranesp today. She has a good understanding of the possible toxicities, side effects and complications of that agent. That includes possible thrombotic complications. The plan however is to keep her hemoglobin between 10 and 11. I do not anticipate major issues. She will need some iron support and she will receive Feraheme on 02/14/2015 and 02/21/2015.  As far as a chronic lymphoid leukemia is concerned she will start rituximab next week. The plan is for 6-8 cycles, the goal being to get the white cell count below 10,000 with an adequate neutrophil count. Once we achieve that goal she will go on maintenance, namely every 2 month rituximab. She will start allopurinol today. We will check hepatitis serologies at the next visit.  What concerns her husband the most is the fact that she is sleeping 15 hours a day as he puts it. If this continues after her hemoglobin has been corrected, we will consider low doses of methylphenidate.  [place] has a good understanding of this plan. She agrees with it. She knows the goal of treatment in her case is control. She will call with any problems that may develop before her next visit here.  [name] C    02/07/2015

## 2015-02-07 NOTE — Telephone Encounter (Signed)
Appointments made and avs printed for pt  [name]

## 2015-02-08 LAB — FERRITIN CHCC: FERRITIN: 160 ng/mL (ref 9–269)

## 2015-02-11 ENCOUNTER — Ambulatory Visit (INDEPENDENT_AMBULATORY_CARE_PROVIDER_SITE_OTHER): Payer: Medicare Other | Admitting: Internal Medicine

## 2015-02-11 ENCOUNTER — Encounter: Payer: Self-pay | Admitting: Internal Medicine

## 2015-02-11 VITALS — BP 165/49 | HR 79 | Temp 98.1°F | Wt 138.0 lb

## 2015-02-11 DIAGNOSIS — Z23 Encounter for immunization: Secondary | ICD-10-CM

## 2015-02-11 DIAGNOSIS — I10 Essential (primary) hypertension: Secondary | ICD-10-CM | POA: Diagnosis not present

## 2015-02-11 DIAGNOSIS — C9110 Chronic lymphocytic leukemia of B-cell type not having achieved remission: Secondary | ICD-10-CM

## 2015-02-11 DIAGNOSIS — Z0000 Encounter for general adult medical examination without abnormal findings: Secondary | ICD-10-CM | POA: Insufficient documentation

## 2015-02-11 DIAGNOSIS — E119 Type 2 diabetes mellitus without complications: Secondary | ICD-10-CM | POA: Diagnosis not present

## 2015-02-11 DIAGNOSIS — N183 Chronic kidney disease, stage 3 unspecified: Secondary | ICD-10-CM

## 2015-02-11 DIAGNOSIS — E1122 Type 2 diabetes mellitus with diabetic chronic kidney disease: Secondary | ICD-10-CM

## 2015-02-11 LAB — GLUCOSE, CAPILLARY: Glucose-Capillary: 175 mg/dL — ABNORMAL HIGH (ref 70–99)

## 2015-02-11 LAB — POCT GLYCOSYLATED HEMOGLOBIN (HGB A1C): Hemoglobin A1C: 6.6

## 2015-02-11 NOTE — Assessment & Plan Note (Signed)
Has received Arinesp injection, will start rituximab shortly, follow up appointment with Dr. [name] on 3/31.

## 2015-02-11 NOTE — Patient Instructions (Signed)
General Instructions: -Continue following up with Dr. [name].  -Let us know if you are interested in a nurse home visit to help with your medications.  -Follow up with [place] in 3 months.    Please bring your medicines with you each time you come to clinic.  Medicines may include prescription medications, over-the-counter medications, herbal remedies, eye drops, vitamins, or other pills.   Progress Toward Treatment Goals:  Treatment Goal 02/11/2015  Hemoglobin A1C at goal  Blood pressure unchanged    Self Care Goals & Plans:  Self Care Goal 02/11/2015  Manage my medications refill my medications on time; bring my medications to every visit; take my medicines as prescribed  Monitor my health -  Eat healthy foods -  Be physically active -  Meeting treatment goals maintain the current self-care plan    Home Blood Glucose Monitoring 02/11/2015  Check my blood sugar no home glucose monitoring     Care Management & Community Referrals:  Referral 10/08/2014  Referrals made for care management support other (see comment)  Referrals made to community resources -

## 2015-02-11 NOTE — Assessment & Plan Note (Signed)
Lab Results  Component Value Date   HGBA1C 6.6 02/11/2015   HGBA1C 6.5 10/08/2014   HGBA1C 6.9 04/30/2014     Assessment: Diabetes control: good control (HgbA1C at goal) Progress toward A1C goal:  at goal Comments: On diet control alone  Plan: Medications:  cotninue diet control Home glucose monitoring: Frequency: no home glucose monitoring Timing:   Instruction/counseling given: reminded to bring medications to each visit Educational resources provided:   Self management tools provided:   Other plans: Follow up in 3-6 months.

## 2015-02-11 NOTE — Progress Notes (Signed)
   Subjective:    Patient ID: [name], female    DOB: 08/04/1921, 81 y.o.   MRN: [id number]  HPI [name] is a 81 yr old woman with PMH of HTN, CKD3, CLL currently on tx with Aranesp and Rituximab (followed by Dr. [name]), well controlled DM2 who presents for routine follow up visit.  She was recently seen by Dr. [name] on 02/07/15 and found to be symptomatic from her anemia and CLL, sleeping up to 15 hrs per day, necessitating wheelchair for mobility and with increased confusion but not other symptoms associated with CLL flare up.  She had Aranesp started that day with goal Hgb of 10-11. She continues to feel tired but denies increased confusion or falls since her visit with Dr. [name].   Of note, she will start rituximab soon (plan for 6-8 cycles) and allopurinol. She has follow up with Dr. [name] on 3/31.   Review of Systems  Constitutional: Negative for fever, chills, diaphoresis, activity change, appetite change, fatigue and unexpected weight change.  Respiratory: Negative for cough and shortness of breath.   Cardiovascular: Negative for chest pain and leg swelling.  Gastrointestinal: Negative for nausea, vomiting, abdominal pain, diarrhea and constipation.  Genitourinary: Negative for dysuria and difficulty urinating.  Musculoskeletal: Positive for gait problem.  Neurological: Negative for dizziness and light-headedness.  Psychiatric/Behavioral: Negative for confusion and agitation.       Objective:   Physical Exam  Constitutional: She appears well-developed and well-nourished. No distress.  Sitting in wheelchair, with cane in hand  Cardiovascular: Normal rate.   Pulmonary/Chest: Effort normal. No respiratory distress. She has no wheezes. She has no rales.  Abdominal: Soft. There is no tenderness.  Musculoskeletal: She exhibits no edema or tenderness.  Neurological: She is alert.  Skin: Skin is warm and dry. She is not diaphoretic.  Psychiatric: She has a  normal mood and affect.  Nursing note and vitals reviewed.         Assessment & Plan:

## 2015-02-11 NOTE — Assessment & Plan Note (Addendum)
BP Readings from Last 3 Encounters:  02/11/15 165/49  02/07/15 159/66  12/03/14 168/58    Lab Results  Component Value Date   NA 143 07/10/2014   K 4.3 07/10/2014   CREATININE 2.4* 07/10/2014    Assessment: Blood pressure control: mildly elevated Progress toward BP goal:  unchanged Comments: She is on cozaar 100mg  daily, Lasix 40 mg daily. Sometimes forgets to take them but she is sure she took them today. BP mildly elevated during this visit. Will not change antihypertensive regimen at this time to prevent further confusion with her medications. Will continue monitoring.   Plan: Medications:  continue current medications Educational resources provided:   Self management tools provided:   Other plans: Follow up in 3 months (will have frequent appointment at the cancer center with initiation of chemo tx for CLL with monitoring of vitals and will call us for follow up sooner if BP persistently elevated)

## 2015-02-11 NOTE — Assessment & Plan Note (Signed)
Received prevnar 13 during this visit--safe with CLL or chemo Declined DEXA scan at this time but may have this done at the cancer hospital, wants to defer this to Dr. [name] Lipid panel deferred to next visit.

## 2015-02-12 NOTE — Progress Notes (Signed)
Case discussed with Dr. Kennerly soon after the resident saw the patient.  We reviewed the resident's history and exam and pertinent patient test results.  I agree with the assessment, diagnosis, and plan of care documented in the resident's note. 

## 2015-02-14 ENCOUNTER — Other Ambulatory Visit (HOSPITAL_BASED_OUTPATIENT_CLINIC_OR_DEPARTMENT_OTHER): Payer: Medicare Other

## 2015-02-14 ENCOUNTER — Ambulatory Visit (HOSPITAL_BASED_OUTPATIENT_CLINIC_OR_DEPARTMENT_OTHER): Payer: Medicare Other

## 2015-02-14 ENCOUNTER — Ambulatory Visit (HOSPITAL_BASED_OUTPATIENT_CLINIC_OR_DEPARTMENT_OTHER): Payer: Medicare Other | Admitting: Nurse Practitioner

## 2015-02-14 DIAGNOSIS — T7840XA Allergy, unspecified, initial encounter: Secondary | ICD-10-CM | POA: Diagnosis not present

## 2015-02-14 DIAGNOSIS — D638 Anemia in other chronic diseases classified elsewhere: Secondary | ICD-10-CM

## 2015-02-14 DIAGNOSIS — Z5112 Encounter for antineoplastic immunotherapy: Secondary | ICD-10-CM

## 2015-02-14 DIAGNOSIS — D631 Anemia in chronic kidney disease: Secondary | ICD-10-CM

## 2015-02-14 DIAGNOSIS — R6889 Other general symptoms and signs: Secondary | ICD-10-CM

## 2015-02-14 DIAGNOSIS — N183 Chronic kidney disease, stage 3 unspecified: Secondary | ICD-10-CM

## 2015-02-14 DIAGNOSIS — E119 Type 2 diabetes mellitus without complications: Secondary | ICD-10-CM

## 2015-02-14 DIAGNOSIS — C9110 Chronic lymphocytic leukemia of B-cell type not having achieved remission: Secondary | ICD-10-CM

## 2015-02-14 DIAGNOSIS — M19019 Primary osteoarthritis, unspecified shoulder: Secondary | ICD-10-CM

## 2015-02-14 DIAGNOSIS — N189 Chronic kidney disease, unspecified: Secondary | ICD-10-CM

## 2015-02-14 LAB — CBC & DIFF AND RETIC
BASO%: 0.3 % (ref 0.0–2.0)
BASOS ABS: 0.5 10*3/uL — AB (ref 0.0–0.1)
EOS%: 0.7 % (ref 0.0–7.0)
Eosinophils Absolute: 1.1 10*3/uL — ABNORMAL HIGH (ref 0.0–0.5)
HCT: 28.1 % — ABNORMAL LOW (ref 34.8–46.6)
HEMOGLOBIN: 9 g/dL — AB (ref 11.6–15.9)
Immature Retic Fract: 31.3 % — ABNORMAL HIGH (ref 1.60–10.00)
LYMPH#: 148.1 10*3/uL — AB (ref 0.9–3.3)
LYMPH%: 91.7 % — ABNORMAL HIGH (ref 14.0–49.7)
MCH: 29.5 pg (ref 25.1–34.0)
MCHC: 32 g/dL (ref 31.5–36.0)
MCV: 92.3 fL (ref 79.5–101.0)
MONO#: 1.5 10*3/uL — ABNORMAL HIGH (ref 0.1–0.9)
MONO%: 0.9 % (ref 0.0–14.0)
NEUT#: 10.4 10*3/uL — ABNORMAL HIGH (ref 1.5–6.5)
NEUT%: 6.4 % — AB (ref 38.4–76.8)
Platelets: 444 10*3/uL — ABNORMAL HIGH (ref 145–400)
RBC: 3.05 10*6/uL — ABNORMAL LOW (ref 3.70–5.45)
RDW: 14.6 % — ABNORMAL HIGH (ref 11.2–14.5)
Retic %: 3.25 % — ABNORMAL HIGH (ref 0.70–2.10)
Retic Ct Abs: 99.13 10*3/uL — ABNORMAL HIGH (ref 33.70–90.70)
WBC: 161.7 10*3/uL (ref 3.9–10.3)

## 2015-02-14 LAB — FERRITIN CHCC: FERRITIN: 85 ng/mL (ref 9–269)

## 2015-02-14 LAB — TECHNOLOGIST REVIEW

## 2015-02-14 MED ORDER — SODIUM CHLORIDE 0.9 % IV SOLN
4.0000 mg | Freq: Once | INTRAVENOUS | Status: AC
  Administered 2015-02-14: 4 mg via INTRAVENOUS
  Filled 2015-02-14: qty 0.4

## 2015-02-14 MED ORDER — MEPERIDINE HCL 25 MG/ML IJ SOLN
12.5000 mg | Freq: Once | INTRAMUSCULAR | Status: AC
  Administered 2015-02-14: 12.5 mg via INTRAVENOUS

## 2015-02-14 MED ORDER — METHYLPREDNISOLONE SODIUM SUCC 125 MG IJ SOLR
125.0000 mg | Freq: Once | INTRAMUSCULAR | Status: AC
  Administered 2015-02-14: 125 mg via INTRAVENOUS

## 2015-02-14 MED ORDER — MEPERIDINE HCL 25 MG/ML IJ SOLN
INTRAMUSCULAR | Status: AC
  Filled 2015-02-14: qty 1

## 2015-02-14 MED ORDER — DIPHENHYDRAMINE HCL 25 MG PO CAPS
25.0000 mg | ORAL_CAPSULE | Freq: Once | ORAL | Status: AC
  Administered 2015-02-14: 25 mg via ORAL

## 2015-02-14 MED ORDER — FAMOTIDINE IN NACL 20-0.9 MG/50ML-% IV SOLN: 20.0000 mg | Freq: Once | INTRAVENOUS | Status: DC | PRN

## 2015-02-14 MED ORDER — DIPHENHYDRAMINE HCL 50 MG/ML IJ SOLN
INTRAMUSCULAR | Status: AC
  Filled 2015-02-14: qty 1

## 2015-02-14 MED ORDER — ACETAMINOPHEN 325 MG PO TABS
650.0000 mg | ORAL_TABLET | Freq: Once | ORAL | Status: AC
  Administered 2015-02-14: 650 mg via ORAL

## 2015-02-14 MED ORDER — DIPHENHYDRAMINE HCL 25 MG PO CAPS
ORAL_CAPSULE | ORAL | Status: AC
  Filled 2015-02-14: qty 1

## 2015-02-14 MED ORDER — RITUXIMAB CHEMO INJECTION 500 MG/50ML
100.0000 mg | Freq: Once | INTRAVENOUS | Status: AC
  Administered 2015-02-14: 100 mg via INTRAVENOUS
  Filled 2015-02-14: qty 10

## 2015-02-14 MED ORDER — DIPHENHYDRAMINE HCL 50 MG/ML IJ SOLN: 25.0000 mg | Freq: Once | INTRAMUSCULAR | Status: DC | PRN

## 2015-02-14 MED ORDER — SODIUM CHLORIDE 0.9 % IV SOLN
4.0000 mg | Freq: Once | INTRAVENOUS | Status: DC
  Filled 2015-02-14: qty 0.4

## 2015-02-14 MED ORDER — FAMOTIDINE IN NACL 20-0.9 MG/50ML-% IV SOLN
INTRAVENOUS | Status: AC
  Filled 2015-02-14: qty 50

## 2015-02-14 MED ORDER — SODIUM CHLORIDE 0.9 % IV SOLN
Freq: Once | INTRAVENOUS | Status: AC
  Administered 2015-02-14: 13:00:00 via INTRAVENOUS

## 2015-02-14 MED ORDER — ACETAMINOPHEN 325 MG PO TABS
ORAL_TABLET | ORAL | Status: AC
  Filled 2015-02-14: qty 2

## 2015-02-14 NOTE — Progress Notes (Signed)
Ok to treat with Blood pressure 163/43 per Dr. [name].  1400 Patient complains of stomach pain. Patient denies SOB, Chest pain. Rituxan stopped, normal saline wide open, Tracy Lesser, [name] notified.  [place], [name] chairside.  Normal saline wide open.   1415 125 mg Solu-medrol given as ordered per Tracy Lesser, [name]  1445 Rituxan restarted per Tracy Lesser, [name]  [phone]  Patient states she is cold and rigors noticed. Rituxan Stopped, normal saline wide open, Tracy Lesser, [name] notified   [place], [name] chairside  1510 12.5 mg Demerol ordered and given IV push per Tracy Bacon,[name]  1512 25 mg Benadryl given IV push per Tracy Lesser, [name]  1517 20 mg Pepcid given IVPB per Tracy Lesser, [name]  Hold Feraheme infusion today as patient has had Rituxan reactions per Tracy Lesser, [name]

## 2015-02-14 NOTE — Progress Notes (Signed)
Per Erline Levine RN with Dr. Vinie Sill, okay to tx with WBC 161.7

## 2015-02-14 NOTE — Patient Instructions (Signed)
[facility] Discharge Instructions for Patients   Today you received the following: Rituxan.   To help prevent nausea and vomiting after your treatment, we encourage you to take your nausea medication as directed.    If you develop nausea and vomiting that is not controlled by your nausea medication, call the clinic.   BELOW ARE SYMPTOMS THAT SHOULD BE REPORTED IMMEDIATELY:  *FEVER GREATER THAN 100.5 F  *CHILLS WITH OR WITHOUT FEVER  NAUSEA AND VOMITING THAT IS NOT CONTROLLED WITH YOUR NAUSEA MEDICATION  *UNUSUAL SHORTNESS OF BREATH  *UNUSUAL BRUISING OR BLEEDING  TENDERNESS IN MOUTH AND THROAT WITH OR WITHOUT PRESENCE OF ULCERS  *URINARY PROBLEMS  *BOWEL PROBLEMS  UNUSUAL RASH Items with * indicate a potential emergency and should be followed up as soon as possible.  Feel free to call the clinic you have any questions or concerns. The clinic phone number is [phone].  Please show the CHEMO ALERT CARD at check-in to the Emergency Department and triage nurse.   

## 2015-02-15 ENCOUNTER — Encounter: Payer: Self-pay | Admitting: Nurse Practitioner

## 2015-02-15 DIAGNOSIS — T7840XA Allergy, unspecified, initial encounter: Secondary | ICD-10-CM | POA: Insufficient documentation

## 2015-02-15 LAB — HEPATITIS B CORE ANTIBODY, IGM: HEP B C IGM: NONREACTIVE

## 2015-02-15 LAB — HEPATITIS B SURFACE ANTIGEN: Hepatitis B Surface Ag: NEGATIVE

## 2015-02-15 NOTE — Assessment & Plan Note (Addendum)
Patient presented to the [facility] today to receive cycle 1 of her Rituxan chemotherapy regimen.  Patient developed a hypersensitivity reaction which included hypotension and rigors.  Reaction was managed per hypersensitivity protocol.  All symptoms did then completely resolve; and patient returned to baseline.  However, decision was made to hold any further Rituxan.  Patient was also scheduled to receive her first cycle of Feraheme infusion as well today.  Initiation of Feraheme will be held today; since patient has received all of the hypersensitivity protocol medications for the Rituxan reaction.  Patient has plans to return next week on 02/21/2015 for labs, and a follow-up visit with Dr. [name] to review other treatment options.

## 2015-02-15 NOTE — Progress Notes (Signed)
SYMPTOM MANAGEMENT CLINIC   HPI: [name] 81 y.o. female diagnosed with chronic lymphocytic leukemia.  Here today to initiate Rituxan chemotherapy regimen.  Patient presented to the [facility] today to receive cycle 1 of her Rituxan chemotherapy regimen.  She developed hypotension with a blood pressure of 84/33 after receiving approximately 15 minutes of the Rituxan infusion.  She denied any other new symptoms.  Rituxan infusion was held; and patient was given Solu-Medrol 125 mg IV. Patient was able to eat a ham sandwich; and was resting comfortably.  Rituxan infusion was re-initiated; but then patient developed rigors.  Rituxan infusion was once again held; and patient was given Demerol 12.5 mg; as well as Benadryl and Pepcid.  All symptoms did then completely resolve; and patient returned to baseline.  However, decision was made to hold any further Rituxan.  Also, patient was scheduled to receive her first cycle of Feraheme today as well.  Will hold on initiation of Feraheme today since patient has already received all hypersensitivity protocol medications with the Rituxan reaction.  Feraheme will need to be rescheduled for a later date.  HPI  ROS  Past Medical History  Diagnosis Date  . Diabetes mellitus   . Hyperlipidemia   . Hypertension   . Carotid stenosis, right 08/2005    40-60%  . History of colonic polyps 2002    Normal colonoscopy 08/2004  . CKD (chronic kidney disease) stage 2, GFR 60-89 ml/min   . CLL (chronic lymphocytic leukemia)     Dr. [name]. s/p Rituxan  . Pulse irregularity     Etiology->frequent PACs, normal EKG    History reviewed. No pertinent past surgical history.  has Type 2 diabetes mellitus with stage 3 chronic kidney disease; HYPERLIPIDEMIA; HYPERCALCEMIA; Essential hypertension; CAROTID ARTERY STENOSIS, RIGHT; CKD (chronic kidney disease) stage 3, GFR 30-59 ml/min; UNSPECIFIED ARTHROPATHY, LOWER LEG; COLONIC POLYPS, HX OF; CLL (chronic  lymphocytic leukemia); Abnormality of gait; Advance directive discussed with patient; Osteoarthritis; Gastroesophageal reflux disease without esophagitis; Anemia of renal disease; Health care maintenance; and Hypersensitivity reaction on her problem list.    has No Known Allergies.    Medication List       This list is accurate as of: 02/14/15 11:59 PM.  Always use your most recent med list.               acetaminophen 500 MG tablet  Commonly known as:  TYLENOL  Take 1,000 mg by mouth 2 (two) times daily as needed. For pain     AGGRENOX 200-25 MG per 12 hr capsule  Generic drug:  dipyridamole-aspirin  TAKE ONE CAPSULE BY MOUTH TWICE A DAY     allopurinol 100 MG tablet  Commonly known as:  ZYLOPRIM  Take 1 tablet (100 mg total) by mouth daily.     CALCIUM PLUS VITAMIN D PO  Take 1 tablet by mouth 2 (two) times daily.     Cranberry 450 MG Caps  Take 1 capsule by mouth daily.     Fish Oil 1200 MG Caps  Take 1 capsule by mouth daily.     furosemide 40 MG tablet  Commonly known as:  LASIX  TAKE 1 TABLET BY MOUTH EVERY DAY     losartan 100 MG tablet  Commonly known as:  COZAAR  TAKE 1 TABLET BY MOUTH EVERY DAY     multivitamins ther. w/minerals Tabs tablet  Take 1 tablet by mouth daily.     MACULAR VITAMIN BENEFIT PO  Take  1 tablet by mouth 2 (two) times daily.     omeprazole 20 MG capsule  Commonly known as:  PRILOSEC  Take 1 capsule (20 mg total) by mouth daily.     simvastatin 20 MG tablet  Commonly known as:  ZOCOR  TAKE 1 TABLET BY MOUTH AT BEDTIME     sodium chloride 0.65 % nasal spray  Commonly known as:  OCEAN NASAL SPRAY  Place 1 spray into the nose as needed for congestion (You may use it 2-3 times per day.).     SYSTANE OP  Place 1 drop into both eyes 2 (two) times daily.     traMADol-acetaminophen 37.5-325 MG per tablet  Commonly known as:  ULTRACET  Take 1-2 tablets by mouth every 6 (six) hours as needed for moderate pain.         PHYSICAL  EXAMINATION  Oncology Vitals 02/14/2015 02/14/2015 02/14/2015 02/14/2015 02/14/2015 02/14/2015 02/14/2015  Height - - - - - - -  Weight - - - - - - -  Weight (lbs) - - - - - - -  BMI (kg/m2) - - - - - - -  Temp 98.1 97.7 97.3 97.4 97.8 98.2 98.2  Pulse 94 78 83 66 68 66 46  Resp 18 18 18 18 16 16 16   Resp (Historical as of 06/23/12) - - - - - - -  SpO2 93 95 95 95 97 97 96  BSA (m2) - - - - - - -   BP Readings from Last 3 Encounters:  02/14/15 161/48  02/11/15 165/49  02/07/15 159/66    Physical Exam  Constitutional: She is oriented to person, place, and time. She appears unhealthy.  HENT:  Head: Normocephalic and atraumatic.  Mouth/Throat: Oropharynx is clear and moist.  Eyes: Conjunctivae and EOM are normal. Pupils are equal, round, and reactive to light. Right eye exhibits no discharge. Left eye exhibits no discharge. No scleral icterus.  Neck: Normal range of motion. Neck supple. No JVD present. No tracheal deviation present. No thyromegaly present.  Cardiovascular: Normal rate, regular rhythm, normal heart sounds and intact distal pulses.   Pulmonary/Chest: Effort normal and breath sounds normal. No respiratory distress. She has no wheezes. She has no rales. She exhibits no tenderness.  Abdominal: Soft. Bowel sounds are normal. She exhibits no distension and no mass. There is no tenderness. There is no rebound and no guarding.  Musculoskeletal: Normal range of motion. She exhibits no edema or tenderness.  Lymphadenopathy:    She has no cervical adenopathy.  Neurological: She is alert and oriented to person, place, and time.  Skin: Skin is warm and dry. No rash noted. No erythema. There is pallor.  Psychiatric: Affect normal.  Nursing note and vitals reviewed.   LABORATORY DATA:. Appointment on 02/14/2015  Component Date Value Ref Range Status  . Ferritin 02/14/2015 85  9 - 269 ng/ml Final  . Hepatitis B Surface Ag 02/14/2015 NEGATIVE  NEGATIVE Final  . Hep B C IgM 02/14/2015  NON REACTIVE  NON REACTIVE Final   High levels of Hepatitis B Core IgM antibody are detectableduring the acute stage of Hepatitis B. This antibody is usedto differentiate current from past HBV infection.   . WBC 02/14/2015 161.7* 3.9 - 10.3 10e3/uL Final  . NEUT# 02/14/2015 10.4* 1.5 - 6.5 10e3/uL Final  . HGB 02/14/2015 9.0* 11.6 - 15.9 g/dL Final  . HCT 02/14/2015 28.1* 34.8 - 46.6 % Final  . Platelets 02/14/2015 444* 145 - 400 10e3/uL  Final  . MCV 02/14/2015 92.3  79.5 - 101.0 fL Final  . MCH 02/14/2015 29.5  25.1 - 34.0 pg Final  . MCHC 02/14/2015 32.0  31.5 - 36.0 g/dL Final  . RBC 02/14/2015 3.05* 3.70 - 5.45 10e6/uL Final  . RDW 02/14/2015 14.6* 11.2 - 14.5 % Final  . lymph# 02/14/2015 148.1* 0.9 - 3.3 10e3/uL Final  . MONO# 02/14/2015 1.5* 0.1 - 0.9 10e3/uL Final  . Eosinophils Absolute 02/14/2015 1.1* 0.0 - 0.5 10e3/uL Final  . Basophils Absolute 02/14/2015 0.5* 0.0 - 0.1 10e3/uL Final  . NEUT% 02/14/2015 6.4* 38.4 - 76.8 % Final  . LYMPH% 02/14/2015 91.7* 14.0 - 49.7 % Final  . MONO% 02/14/2015 0.9  0.0 - 14.0 % Final  . EOS% 02/14/2015 0.7  0.0 - 7.0 % Final  . BASO% 02/14/2015 0.3  0.0 - 2.0 % Final  . Retic % 02/14/2015 3.25* 0.70 - 2.10 % Final  . Retic Ct Abs 02/14/2015 99.13* 33.70 - 90.70 10e3/uL Final  . Immature Retic Fract 02/14/2015 31.30* 1.60 - 10.00 % Final  . Technologist Review 02/14/2015 Variant lymphs, mod smudge cells present   Final     RADIOGRAPHIC STUDIES: No results found.  ASSESSMENT/PLAN:    CLL (chronic lymphocytic leukemia) Patient presented to the [facility] today to receive cycle 1 of her Rituxan chemotherapy regimen.  Patient developed a hypersensitivity reaction which included hypotension and rigors.  Reaction was managed per hypersensitivity protocol.  All symptoms did then completely resolve; and patient returned to baseline.  However, decision was made to hold any further Rituxan.  Patient was also scheduled to receive her first  cycle of Feraheme infusion as well today.  Initiation of Feraheme will be held today; since patient has received all of the hypersensitivity protocol medications for the Rituxan reaction.  Patient has plans to return next week on 02/21/2015 for labs, and a follow-up visit with Dr. [name] to review other treatment options.   Hypersensitivity reaction Patient presented to the [facility] today to receive cycle 1 of her Rituxan chemotherapy regimen.  She developed hypotension with a blood pressure of 84/33 after receiving approximately 15 minutes of the Rituxan infusion.  She denied any other new symptoms.  Rituxan infusion was held; and patient was given Solu-Medrol 125 mg IV. Patient was able to eat a ham sandwich; and was resting comfortably.  Rituxan infusion was re-initiated; but then patient developed rigors.  Rituxan infusion was once again held; and patient was given Demerol 12.5 mg; as well as Benadryl and Pepcid.  All symptoms did then completely resolve; and patient returned to baseline.  However, decision was made to hold any further Rituxan.  Patient has plans to return next week on 02/21/2015 for labs, and a follow-up visit with Dr. [name] to review other treatment options.    Patient stated understanding of all instructions; and was in agreement with this plan of care. The patient knows to call the clinic with any problems, questions or concerns.   Review/collaboration with Dr. [name] regarding all aspects of patient's visit today.   Total time spent with patient was 40 minutes;  with greater than 75 percent of that time spent in face to face counseling regarding patient's symptoms,  and coordination of care and follow up.  Disclaimer: This note was dictated with voice recognition software. Similar sounding words can inadvertently be transcribed and may not be corrected upon review.   Drue Second, NP 02/15/2015   Department is is

## 2015-02-15 NOTE — Assessment & Plan Note (Signed)
Patient presented to the [facility] today to receive cycle 1 of her Rituxan chemotherapy regimen.  She developed hypotension with a blood pressure of 84/33 after receiving approximately 15 minutes of the Rituxan infusion.  She denied any other new symptoms.  Rituxan infusion was held; and patient was given Solu-Medrol 125 mg IV. Patient was able to eat a ham sandwich; and was resting comfortably.  Rituxan infusion was re-initiated; but then patient developed rigors.  Rituxan infusion was once again held; and patient was given Demerol 12.5 mg; as well as Benadryl and Pepcid.  All symptoms did then completely resolve; and patient returned to baseline.  However, decision was made to hold any further Rituxan.  Patient has plans to return next week on 02/21/2015 for labs, and a follow-up visit with Dr. [name] to review other treatment options.

## 2015-02-17 ENCOUNTER — Other Ambulatory Visit: Payer: Self-pay | Admitting: Oncology

## 2015-02-20 ENCOUNTER — Telehealth: Payer: Self-pay

## 2015-02-20 NOTE — Telephone Encounter (Signed)
Asked pt how she is doing after her chemo reaction of last week with shaking and nausea. She stated she is fine. Confirmed appt tomorrow at 9 am.

## 2015-02-21 ENCOUNTER — Ambulatory Visit (HOSPITAL_BASED_OUTPATIENT_CLINIC_OR_DEPARTMENT_OTHER): Payer: Medicare Other

## 2015-02-21 ENCOUNTER — Ambulatory Visit: Payer: Medicare Other

## 2015-02-21 ENCOUNTER — Other Ambulatory Visit (HOSPITAL_BASED_OUTPATIENT_CLINIC_OR_DEPARTMENT_OTHER): Payer: Medicare Other

## 2015-02-21 ENCOUNTER — Ambulatory Visit (HOSPITAL_BASED_OUTPATIENT_CLINIC_OR_DEPARTMENT_OTHER): Payer: Medicare Other | Admitting: Oncology

## 2015-02-21 ENCOUNTER — Telehealth: Payer: Self-pay | Admitting: Oncology

## 2015-02-21 VITALS — BP 169/52 | HR 71 | Temp 98.1°F | Resp 18 | Ht <= 58 in | Wt 137.5 lb

## 2015-02-21 DIAGNOSIS — D631 Anemia in chronic kidney disease: Secondary | ICD-10-CM

## 2015-02-21 DIAGNOSIS — N183 Chronic kidney disease, stage 3 unspecified: Secondary | ICD-10-CM

## 2015-02-21 DIAGNOSIS — D638 Anemia in other chronic diseases classified elsewhere: Secondary | ICD-10-CM

## 2015-02-21 DIAGNOSIS — E119 Type 2 diabetes mellitus without complications: Secondary | ICD-10-CM

## 2015-02-21 DIAGNOSIS — N189 Chronic kidney disease, unspecified: Secondary | ICD-10-CM

## 2015-02-21 DIAGNOSIS — C9110 Chronic lymphocytic leukemia of B-cell type not having achieved remission: Secondary | ICD-10-CM | POA: Diagnosis not present

## 2015-02-21 DIAGNOSIS — M19019 Primary osteoarthritis, unspecified shoulder: Secondary | ICD-10-CM

## 2015-02-21 DIAGNOSIS — E1122 Type 2 diabetes mellitus with diabetic chronic kidney disease: Secondary | ICD-10-CM

## 2015-02-21 LAB — CBC & DIFF AND RETIC
BASO%: 0.4 % (ref 0.0–2.0)
Basophils Absolute: 0.5 10*3/uL — ABNORMAL HIGH (ref 0.0–0.1)
EOS ABS: 0.6 10*3/uL — AB (ref 0.0–0.5)
EOS%: 0.5 % (ref 0.0–7.0)
HCT: 30.5 % — ABNORMAL LOW (ref 34.8–46.6)
HEMOGLOBIN: 9 g/dL — AB (ref 11.6–15.9)
IMMATURE RETIC FRACT: 14.9 % — AB (ref 1.60–10.00)
LYMPH%: 89.5 % — ABNORMAL HIGH (ref 14.0–49.7)
MCH: 28.1 pg (ref 25.1–34.0)
MCHC: 29.6 g/dL — ABNORMAL LOW (ref 31.5–36.0)
MCV: 95.2 fL (ref 79.5–101.0)
MONO#: 1 10*3/uL — ABNORMAL HIGH (ref 0.1–0.9)
MONO%: 0.8 % (ref 0.0–14.0)
NEUT%: 8.8 % — AB (ref 38.4–76.8)
NEUTROS ABS: 10.7 10*3/uL — AB (ref 1.5–6.5)
PLATELETS: 379 10*3/uL (ref 145–400)
RBC: 3.2 10*6/uL — AB (ref 3.70–5.45)
RDW: 15.1 % — ABNORMAL HIGH (ref 11.2–14.5)
RETIC CT ABS: 100.8 10*3/uL — AB (ref 33.70–90.70)
Retic %: 3.15 % — ABNORMAL HIGH (ref 0.70–2.10)
WBC: 121.8 10*3/uL (ref 3.9–10.3)
lymph#: 109 10*3/uL — ABNORMAL HIGH (ref 0.9–3.3)

## 2015-02-21 LAB — TECHNOLOGIST REVIEW

## 2015-02-21 LAB — FERRITIN CHCC: FERRITIN: 58 ng/mL (ref 9–269)

## 2015-02-21 MED ORDER — DARBEPOETIN ALFA 200 MCG/0.4ML IJ SOSY
200.0000 ug | PREFILLED_SYRINGE | Freq: Once | INTRAMUSCULAR | Status: AC
  Administered 2015-02-21: 200 ug via SUBCUTANEOUS
  Filled 2015-02-21: qty 0.4

## 2015-02-21 MED ORDER — PREDNISONE 10 MG PO TABS: ORAL_TABLET | ORAL | Status: DC

## 2015-02-21 NOTE — Progress Notes (Signed)
ID: [name]   DOB: 11-Sep-1921  MR#: [id number]  CSN#:[id number]  PCP: Blain Pais, MD GYN: SU:  OTHER MD: Susa Day  CHIEF COMPLAINT: Chronic lymphoid leukemia, anemia of renal failure  CURRENT TREATMENT: Aranesp,  Low-dose prednisone  INTERVAL HISTORY: [name] returns today with her husband [name] for followup of her chronic lymphoid leukemia.  She had one dose of right toxin last week , but tolerated very poorly. She had a significant drop in her blood pressure. This corrected with fluids, but he was sufficiently worrisome that I am uncomfortable repeating this. She did have a drop in her total white cell count from gravida 100,000 to just over 120,000. She had no other side effects related to this.[name]  REVIEW OF SYSTEMS: [place] very positive and essentially denies any intercurrent problems. Cell agrees that she is not having any fevers, bleeding, rash, or unusual fatigue. She has some arthritis pains and stress urinary incontinence. A detailed review of systems today was otherwise stable.  PAST MEDICAL HISTORY: Past Medical History  Diagnosis Date  . Diabetes mellitus   . Hyperlipidemia   . Hypertension   . Carotid stenosis, right 08/2005    40-60%  . History of colonic polyps 2002    Normal colonoscopy 08/2004  . CKD (chronic kidney disease) stage 2, GFR 60-89 ml/min   . CLL (chronic lymphocytic leukemia)     Dr. [name]. s/p Rituxan  . Pulse irregularity     Etiology->frequent PACs, normal EKG    FAMILY HISTORY No family history on file.  GYNECOLOGIC HISTORY: GX P0  SOCIAL HISTORY: She is originally from [place]. 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 They have no children. They are Catholic.  ADVANCED DIRECTIVES: in place  HEALTH MAINTENANCE: History  Substance Use Topics  . Smoking status: Former Research scientist (life sciences)  . Smokeless tobacco: Not on file  . Alcohol Use: Not on file     Colonoscopy: [facility]  PAP:  Bone  density:  Lipid panel:  No Known Allergies  Current Outpatient Prescriptions  Medication Sig Dispense Refill  . acetaminophen (TYLENOL) 500 MG tablet Take 1,000 mg by mouth 2 (two) times daily as needed. For pain    . AGGRENOX 25-200 MG per 12 hr capsule TAKE ONE CAPSULE BY MOUTH TWICE A DAY 200 capsule 2  . allopurinol (ZYLOPRIM) 100 MG tablet Take 1 tablet (100 mg total) by mouth daily. 90 tablet 3  . Calcium Carbonate-Vitamin D (CALCIUM PLUS VITAMIN D PO) Take 1 tablet by mouth 2 (two) times daily.    . Cranberry 450 MG CAPS Take 1 capsule by mouth daily.    . furosemide (LASIX) 40 MG tablet TAKE 1 TABLET BY MOUTH EVERY DAY 90 tablet 3  . losartan (COZAAR) 100 MG tablet TAKE 1 TABLET BY MOUTH EVERY DAY 100 tablet 3  . Multiple Vitamins-Minerals (MACULAR VITAMIN BENEFIT PO) Take 1 tablet by mouth 2 (two) times daily.    . Multiple Vitamins-Minerals (MULTIVITAMINS THER. W/MINERALS) TABS Take 1 tablet by mouth daily.    . Omega-3 Fatty Acids (FISH OIL) 1200 MG CAPS Take 1 capsule by mouth daily.    [name] omeprazole (PRILOSEC) 20 MG capsule Take 1 capsule (20 mg total) by mouth daily. 100 capsule 12  . Polyethyl Glycol-Propyl Glycol (SYSTANE OP) Place 1 drop into both eyes 2 (two) times daily.    . simvastatin (ZOCOR) 20 MG tablet TAKE 1 TABLET BY MOUTH AT BEDTIME 100 tablet 1  .  sodium chloride (OCEAN NASAL SPRAY) 0.65 % nasal spray Place 1 spray into the nose as needed for congestion (You may use it 2-3 times per day.). (Patient not taking: Reported on 12/03/2014) 45 mL 3  . traMADol-acetaminophen (ULTRACET) 37.5-325 MG per tablet Take 1-2 tablets by mouth every 6 (six) hours as needed for moderate pain. (Patient not taking: Reported on 12/03/2014) 30 tablet 3   No current facility-administered medications for this visit.    OBJECTIVE: Elderly white woman examined in a wheelchair Filed Vitals:   02/21/15 0925  BP: 169/52  Pulse: 71  Temp: 98.1 F (36.7 C)  Resp: 18     Body mass index is  31.96 kg/(m^2).    ECOG FS: 2   Sclerae not icteric Oropharynx clear-- no thrush or other lesions No cervical or supraclavicular adenopathy, no axillary or inguinal adenopathy Lungs no rales or rhonchi Heart regular rate and rhythm Abd soft, obese, nontender, positive bowel sounds MSK no focal spinal tenderness, no joint swelling Neuro: nonfocal, well oriented, pleasant affect Breasts: Deferred    LAB RESULTS: Lab Results  Component Value Date   WBC 121.8* 02/21/2015   NEUTROABS 10.7* 02/21/2015   HGB 9.0* 02/21/2015   HCT 30.5* 02/21/2015   MCV 95.2 02/21/2015   PLT 379 02/21/2015      Chemistry      Component Value Date/Time   NA 143 07/10/2014 1447   NA 136 08/21/2013 1429   K 4.3 07/10/2014 1447   K 4.6 08/21/2013 1429   CL 95* 08/21/2013 1429   CL 103 04/18/2013 1422   CO2 22 07/10/2014 1447   CO2 25 08/21/2013 1429   BUN 52.0* 07/10/2014 1447   BUN 47* 08/21/2013 1429   CREATININE 2.4* 07/10/2014 1447   CREATININE 2.06* 08/21/2013 1429   CREATININE 1.89* 06/28/2012 1425      Component Value Date/Time   CALCIUM 9.4 07/10/2014 1447   CALCIUM 9.3 08/21/2013 1429   CALCIUM 10.5 08/07/2008 0000   ALKPHOS 100 07/10/2014 1447   ALKPHOS 77 06/28/2012 1425   AST 21 07/10/2014 1447   AST 15 06/28/2012 1425   ALT 10 07/10/2014 1447   ALT 10 06/28/2012 1425   BILITOT 0.27 07/10/2014 1447   BILITOT 0.4 06/28/2012 1425       No results found for: LABCA2  No components found for: LABCA125  No results for input(s): INR in the last 168 hours.  Urinalysis No results found for: COLORURINE  STUDIES: No results found.   ASSESSMENT: 81 y.o.  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  Status post treatment with Rituxan in 2004 and 2007, with no treatment after November 2007.   (2) anemia, possibly due to CLL progression but certainly  worsened by chronic renal insufficiency     (a) aranesp started 02/07/2015, to be repeated every 14 days, goal is Hb >10  (b) feraheme to be given 02/14/2015 and 02/21/2015  (3) rituximab to be started 02/14/2015, 6-8 weekly treatments planned, goal is WBC <10  (a) allopurinol started 02/07/2015  (b) hepatitis serologies drawn 02/11/2015 negative  PLAN:  Ahtziry  Had a hard time with the right toxin last week. She had tolerated it well in the past. I am afraid we may have to forgo that treatment. We could certainly try ofatumumab [place] she can tolerate that. For now however what I would like to do is try something simple and  see how much mileage we can get out of it.  Her counts did drop by 40,000, which is favorable. She had no other problems other than the reaction she had while here.   at this point she will start prednisone 10 mg with breakfast. I warned her regarding problems with sugar and thrush. She is going to see me again next week and I we can assess how she tolerates this. If he goes well we can continue that while receiving her Aranesp, the goal being to get her hemoglobin between 11 and 12. Once we  Get her white cells below 100,000 we will consider possibly adding methylphenidate  Hanifah has a good understanding of this plan. She agrees with it. She knows the goal of treatment in her case is control. She will call with any problems that may develop before her next visit here.  MAGRINAT,GUSTAV C    02/21/2015

## 2015-02-21 NOTE — Telephone Encounter (Signed)
Appointment made anad injection added per dr [name] as no chemo will be given,avs printed for patient

## 2015-02-21 NOTE — Patient Instructions (Signed)
Darbepoetin Alfa injection What is this medicine? DARBEPOETIN ALFA (dar be POE e tin AL fa) helps your body make more red blood cells. It is used to treat anemia caused by chronic kidney failure and chemotherapy. This medicine may be used for other purposes; ask your health care provider or pharmacist if you have questions. COMMON BRAND NAME(S): Aranesp What should I tell my health care provider before I take this medicine? They need to know if you have any of these conditions: -blood clotting disorders or history of blood clots -cancer patient not on chemotherapy -cystic fibrosis -heart disease, such as angina, heart failure, or a history of a heart attack -hemoglobin level of 12 g/dL or greater -high blood pressure -low levels of folate, iron, or vitamin B12 -seizures -an unusual or allergic reaction to darbepoetin, erythropoietin, albumin, hamster proteins, latex, other medicines, foods, dyes, or preservatives -pregnant or trying to get pregnant -breast-feeding How should I use this medicine? This medicine is for injection into a vein or under the skin. It is usually given by a health care professional in a hospital or clinic setting. If you get this medicine at home, you will be taught how to prepare and give this medicine. Do not shake the solution before you withdraw a dose. Use exactly as directed. Take your medicine at regular intervals. Do not take your medicine more often than directed. It is important that you put your used needles and syringes in a special sharps container. Do not put them in a trash can. If you do not have a sharps container, call your pharmacist or healthcare provider to get one. Talk to your pediatrician regarding the use of this medicine in children. While this medicine may be used in children as young as 1 year for selected conditions, precautions do apply. Overdosage: If you think you have taken too much of this medicine contact a poison control center or  emergency room at once. NOTE: This medicine is only for you. Do not share this medicine with others. What if I miss a dose? If you miss a dose, take it as soon as you can. If it is almost time for your next dose, take only that dose. Do not take double or extra doses. What may interact with this medicine? Do not take this medicine with any of the following medications: -epoetin alfa This list may not describe all possible interactions. Give your health care provider a list of all the medicines, herbs, non-prescription drugs, or dietary supplements you use. Also tell them if you smoke, drink alcohol, or use illegal drugs. Some items may interact with your medicine. What should I watch for while using this medicine? Visit your prescriber or health care professional for regular checks on your progress and for the needed blood tests and blood pressure measurements. It is especially important for the doctor to make sure your hemoglobin level is in the desired range, to limit the risk of potential side effects and to give you the best benefit. Keep all appointments for any recommended tests. Check your blood pressure as directed. Ask your doctor what your blood pressure should be and when you should contact him or her. As your body makes more red blood cells, you may need to take iron, folic acid, or vitamin B supplements. Ask your doctor or health care provider which products are right for you. If you have kidney disease continue dietary restrictions, even though this medication can make you feel better. Talk with your doctor or health   care professional about the foods you eat and the vitamins that you take. What side effects may I notice from receiving this medicine? Side effects that you should report to your doctor or health care professional as soon as possible: -allergic reactions like skin rash, itching or hives, swelling of the face, lips, or tongue -breathing problems -changes in vision -chest  pain -confusion, trouble speaking or understanding -feeling faint or lightheaded, falls -high blood pressure -muscle aches or pains -pain, swelling, warmth in the leg -rapid weight gain -severe headaches -sudden numbness or weakness of the face, arm or leg -trouble walking, dizziness, loss of balance or coordination -seizures (convulsions) -swelling of the ankles, feet, hands -unusually weak or tired Side effects that usually do not require medical attention (report to your doctor or health care professional if they continue or are bothersome): -diarrhea -fever, chills (flu-like symptoms) -headaches -nausea, vomiting -redness, stinging, or swelling at site where injected This list may not describe all possible side effects. Call your doctor for medical advice about side effects. You may report side effects to FDA at 1-800-FDA-1088. Where should I keep my medicine? Keep out of the reach of children. Store in a refrigerator between 2 and 8 degrees C (36 and 46 degrees F). Do not freeze. Do not shake. Throw away any unused portion if using a single-dose vial. Throw away any unused medicine after the expiration date. NOTE: This sheet is a summary. It may not cover all possible information. If you have questions about this medicine, talk to your doctor, pharmacist, or health care provider.  2015, [employer]vier/Gold Standard. (2008-10-23 10:23:57)  

## 2015-02-27 ENCOUNTER — Other Ambulatory Visit: Payer: Self-pay | Admitting: Oncology

## 2015-02-28 ENCOUNTER — Telehealth: Payer: Self-pay | Admitting: Oncology

## 2015-02-28 ENCOUNTER — Other Ambulatory Visit (HOSPITAL_BASED_OUTPATIENT_CLINIC_OR_DEPARTMENT_OTHER): Payer: Medicare Other

## 2015-02-28 ENCOUNTER — Telehealth: Payer: Self-pay | Admitting: *Deleted

## 2015-02-28 ENCOUNTER — Ambulatory Visit (HOSPITAL_BASED_OUTPATIENT_CLINIC_OR_DEPARTMENT_OTHER): Payer: Medicare Other

## 2015-02-28 ENCOUNTER — Ambulatory Visit (HOSPITAL_BASED_OUTPATIENT_CLINIC_OR_DEPARTMENT_OTHER): Payer: Medicare Other | Admitting: Oncology

## 2015-02-28 DIAGNOSIS — D631 Anemia in chronic kidney disease: Secondary | ICD-10-CM | POA: Diagnosis present

## 2015-02-28 DIAGNOSIS — N183 Chronic kidney disease, stage 3 unspecified: Secondary | ICD-10-CM

## 2015-02-28 DIAGNOSIS — Z5112 Encounter for antineoplastic immunotherapy: Secondary | ICD-10-CM | POA: Diagnosis present

## 2015-02-28 DIAGNOSIS — C9110 Chronic lymphocytic leukemia of B-cell type not having achieved remission: Secondary | ICD-10-CM

## 2015-02-28 DIAGNOSIS — E119 Type 2 diabetes mellitus without complications: Secondary | ICD-10-CM

## 2015-02-28 DIAGNOSIS — M19019 Primary osteoarthritis, unspecified shoulder: Secondary | ICD-10-CM

## 2015-02-28 DIAGNOSIS — N189 Chronic kidney disease, unspecified: Secondary | ICD-10-CM | POA: Diagnosis present

## 2015-02-28 DIAGNOSIS — D638 Anemia in other chronic diseases classified elsewhere: Secondary | ICD-10-CM

## 2015-02-28 LAB — CBC & DIFF AND RETIC
BASO%: 0.3 % (ref 0.0–2.0)
Basophils Absolute: 0.4 10*3/uL — ABNORMAL HIGH (ref 0.0–0.1)
EOS ABS: 0.2 10*3/uL (ref 0.0–0.5)
EOS%: 0.1 % (ref 0.0–7.0)
HCT: 32.4 % — ABNORMAL LOW (ref 34.8–46.6)
HGB: 9.4 g/dL — ABNORMAL LOW (ref 11.6–15.9)
Immature Retic Fract: 28.8 % — ABNORMAL HIGH (ref 1.60–10.00)
LYMPH%: 86 % — AB (ref 14.0–49.7)
MCH: 28.7 pg (ref 25.1–34.0)
MCHC: 29 g/dL — AB (ref 31.5–36.0)
MCV: 98.8 fL (ref 79.5–101.0)
MONO#: 4.9 10*3/uL — ABNORMAL HIGH (ref 0.1–0.9)
MONO%: 3.8 % (ref 0.0–14.0)
NEUT#: 12.4 10*3/uL — ABNORMAL HIGH (ref 1.5–6.5)
NEUT%: 9.8 % — ABNORMAL LOW (ref 38.4–76.8)
PLATELETS: 373 10*3/uL (ref 145–400)
RBC: 3.28 10*6/uL — ABNORMAL LOW (ref 3.70–5.45)
RDW: 16.4 % — AB (ref 11.2–14.5)
Retic %: 3.88 % — ABNORMAL HIGH (ref 0.70–2.10)
Retic Ct Abs: 127.26 10*3/uL — ABNORMAL HIGH (ref 33.70–90.70)
WBC: 127.2 10*3/uL — AB (ref 3.9–10.3)
lymph#: 109.5 10*3/uL — ABNORMAL HIGH (ref 0.9–3.3)

## 2015-02-28 LAB — FERRITIN CHCC: Ferritin: 41 ng/ml (ref 9–269)

## 2015-02-28 LAB — TECHNOLOGIST REVIEW

## 2015-02-28 MED ORDER — DIPHENHYDRAMINE HCL 25 MG PO CAPS
25.0000 mg | ORAL_CAPSULE | Freq: Once | ORAL | Status: AC
  Administered 2015-02-28: 25 mg via ORAL

## 2015-02-28 MED ORDER — SODIUM CHLORIDE 0.9 % IV SOLN
INTRAVENOUS | Status: DC
  Administered 2015-02-28: 13:00:00 via INTRAVENOUS

## 2015-02-28 MED ORDER — ACETAMINOPHEN 325 MG PO TABS
ORAL_TABLET | ORAL | Status: AC
  Filled 2015-02-28: qty 2

## 2015-02-28 MED ORDER — SODIUM CHLORIDE 0.9 % IV SOLN
100.0000 mg | Freq: Once | INTRAVENOUS | Status: AC
  Administered 2015-02-28: 100 mg via INTRAVENOUS
  Filled 2015-02-28: qty 10

## 2015-02-28 MED ORDER — METHYLPREDNISOLONE SODIUM SUCC 125 MG IJ SOLR
125.0000 mg | Freq: Every day | INTRAMUSCULAR | Status: DC
  Administered 2015-02-28: 125 mg via INTRAVENOUS

## 2015-02-28 MED ORDER — DIPHENHYDRAMINE HCL 50 MG/ML IJ SOLN
INTRAMUSCULAR | Status: AC
  Filled 2015-02-28: qty 1

## 2015-02-28 MED ORDER — DIPHENHYDRAMINE HCL 50 MG/ML IJ SOLN
25.0000 mg | Freq: Once | INTRAMUSCULAR | Status: AC | PRN
  Administered 2015-02-28: 25 mg via INTRAVENOUS

## 2015-02-28 MED ORDER — DIPHENHYDRAMINE HCL 25 MG PO CAPS
ORAL_CAPSULE | ORAL | Status: AC
  Filled 2015-02-28: qty 1

## 2015-02-28 MED ORDER — METHYLPREDNISOLONE SODIUM SUCC 125 MG IJ SOLR
INTRAMUSCULAR | Status: AC
  Filled 2015-02-28: qty 2

## 2015-02-28 MED ORDER — ACETAMINOPHEN 325 MG PO TABS
650.0000 mg | ORAL_TABLET | Freq: Once | ORAL | Status: AC
  Administered 2015-02-28: 650 mg via ORAL

## 2015-02-28 MED ORDER — FAMOTIDINE IN NACL 20-0.9 MG/50ML-% IV SOLN
20.0000 mg | Freq: Two times a day (BID) | INTRAVENOUS | Status: DC
  Administered 2015-02-28: 20 mg via INTRAVENOUS

## 2015-02-28 MED ORDER — FAMOTIDINE IN NACL 20-0.9 MG/50ML-% IV SOLN
INTRAVENOUS | Status: AC
  Filled 2015-02-28: qty 50

## 2015-02-28 NOTE — Progress Notes (Signed)
Rituxan tolerated well, rate increased to 100 mg/hr.

## 2015-02-28 NOTE — Progress Notes (Signed)
1240 patient reports she has taken all her oral home medications.  B/P  = 188/53 verbal order from Dr. [name] to proceed with treatment.  Give 500 cc before treatment, pre -meds and give Rituxan slowly as tolerated.    1537:  Dr. [name] has just seen patient before recent increase in rituxan.  Called to notify him Rituxan has been stopped due to c/o feeling cold.  Approximately 40 ml remain in rituxan bag. 1554: cold shakes improved but continues to c/o feeling cold.  Called Cindy bacon with [facility] who came to see patient.  Benadryl 25 mg IV order received and read back.   1615 patient looks better, resting comfortably with no further complaints.  Resumed Rituxan at 100 mg/hr. 1625: Tolerated Rituxan well and completed at 100 mg/hr. 1635  Attempting to call her husband for transportation home.  No answer or answering machine.   1641: unable to reach at [phone].  Went to lobby, calling spouse name and located him in lobby.

## 2015-02-28 NOTE — Progress Notes (Signed)
Rituxan stopped.  C/O feeling cold.  Offered blankets but she denies need.  Noted shivering of head and shoulders.  Accepted blankets to neck, shoulders and arms.

## 2015-02-28 NOTE — Progress Notes (Signed)
Continues to c/o feeling cold.  Head and shoulders tremors/shaking improved

## 2015-02-28 NOTE — Patient Instructions (Signed)

## 2015-02-28 NOTE — Progress Notes (Signed)
ID: [name]   DOB: 07-24-1921  MR#: [id number]  CSN#:[id number]  PCP: Blain Pais, MD GYN: SU:  OTHER MD: Susa Day  CHIEF COMPLAINT: Chronic lymphoid leukemia, anemia of renal failure  CURRENT TREATMENT: Aranesp,  Low-dose prednisone; rituximab  INTERVAL HISTORY: [name] returns today for followup of her chronic lymphoid leukemia.  She had a significant reaction to her first dose of rituximab, so we put her a low-dose prednisone and she comes today to give it a second try.  REVIEW OF SYSTEMS: The review of system remains otherwise stable. She is as always very positive and when asked about problems "I have no problems".  PAST MEDICAL HISTORY: Past Medical History  Diagnosis Date  . Diabetes mellitus   . Hyperlipidemia   . Hypertension   . Carotid stenosis, right 08/2005    40-60%  . History of colonic polyps 2002    Normal colonoscopy 08/2004  . CKD (chronic kidney disease) stage 2, GFR 60-89 ml/min   . CLL (chronic lymphocytic leukemia)     Dr. [name]. s/p Rituxan  . Pulse irregularity     Etiology->frequent PACs, normal EKG    FAMILY HISTORY No family history on file.  GYNECOLOGIC HISTORY: GX P0  SOCIAL HISTORY: She is originally from [place]. 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 They have no children. They are Catholic.  ADVANCED DIRECTIVES: in place  HEALTH MAINTENANCE: History  Substance Use Topics  . Smoking status: Former Research scientist (life sciences)  . Smokeless tobacco: Not on file  . Alcohol Use: Not on file     Colonoscopy: Buccini  PAP:  Bone density:  Lipid panel:  No Known Allergies  Current Outpatient Prescriptions  Medication Sig Dispense Refill  . acetaminophen (TYLENOL) 500 MG tablet Take 1,000 mg by mouth 2 (two) times daily as needed. For pain    . AGGRENOX 25-200 MG per 12 hr capsule TAKE ONE CAPSULE BY MOUTH TWICE A DAY 200 capsule 2  . allopurinol (ZYLOPRIM) 100 MG tablet Take 1 tablet (100 mg total) by mouth  daily. 90 tablet 3  . Calcium Carbonate-Vitamin D (CALCIUM PLUS VITAMIN D PO) Take 1 tablet by mouth 2 (two) times daily.    . Cranberry 450 MG CAPS Take 1 capsule by mouth daily.    . furosemide (LASIX) 40 MG tablet TAKE 1 TABLET BY MOUTH EVERY DAY 90 tablet 3  . losartan (COZAAR) 100 MG tablet TAKE 1 TABLET BY MOUTH EVERY DAY 100 tablet 3  . Multiple Vitamins-Minerals (MACULAR VITAMIN BENEFIT PO) Take 1 tablet by mouth 2 (two) times daily.    . Multiple Vitamins-Minerals (MULTIVITAMINS THER. W/MINERALS) TABS Take 1 tablet by mouth daily.    . Omega-3 Fatty Acids (FISH OIL) 1200 MG CAPS Take 1 capsule by mouth daily.    [name] omeprazole (PRILOSEC) 20 MG capsule Take 1 capsule (20 mg total) by mouth daily. 100 capsule 12  . Polyethyl Glycol-Propyl Glycol (SYSTANE OP) Place 1 drop into both eyes 2 (two) times daily.    . predniSONE (DELTASONE) 10 MG tablet Take one tablet (10 mg) daily with breakfast 30 tablet 6  . simvastatin (ZOCOR) 20 MG tablet TAKE 1 TABLET BY MOUTH AT BEDTIME 100 tablet 1  . sodium chloride (OCEAN NASAL SPRAY) 0.65 % nasal spray Place 1 spray into the nose as needed for congestion (You may use it 2-3 times per day.). (Patient not taking: Reported on 12/03/2014) 45 mL 3  . traMADol-acetaminophen (ULTRACET) 37.5-325 MG per  tablet Take 1-2 tablets by mouth every 6 (six) hours as needed for moderate pain. (Patient not taking: Reported on 12/03/2014) 30 tablet 3   No current facility-administered medications for this visit.   Facility-Administered Medications Ordered in Other Visits  Medication Dose Route Frequency Provider Last Rate Last Dose  . 0.9 %  sodium chloride infusion   Intravenous Continuous Chauncey Cruel, MD 10 mL/hr at 02/28/15 1240    . 0.9 %  sodium chloride infusion   Intravenous Continuous Chauncey Cruel, MD 150 mL/hr at 02/28/15 1415    . famotidine (PEPCID) IVPB 20 mg  20 mg Intravenous Q12H Chauncey Cruel, MD   20 mg at 02/28/15 1338  . methylPREDNISolone  sodium succinate (SOLU-MEDROL) 125 mg/2 mL injection 125 mg  125 mg Intravenous Daily Chauncey Cruel, MD   125 mg at 02/28/15 1337    OBJECTIVE: Elderly white woman examined in a wheelchair There were no vitals filed for this visit.   There is no weight on file to calculate BMI.    ECOG FS: 1 Vitals - 1 value per visit 12/27/5571  SYSTOLIC 220  DIASTOLIC 45  Pulse 70  Temperature 98  Respirations 20  Weight (lb)   Height   BMI   VISIT REPORT    She was evaluated in the treatment area. EOMs intact, sclera are nonicteric No cervical or supraclavicular adenopathy Lungs no rales or wheezes  Heart regular rate and rhythm Abdomen benign   LAB RESULTS: Lab Results  Component Value Date   WBC 127.2* 02/28/2015   NEUTROABS 12.4* 02/28/2015   HGB 9.4* 02/28/2015   HCT 32.4* 02/28/2015   MCV 98.8 02/28/2015   PLT 373 02/28/2015      Chemistry      Component Value Date/Time   NA 143 07/10/2014 1447   NA 136 08/21/2013 1429   K 4.3 07/10/2014 1447   K 4.6 08/21/2013 1429   CL 95* 08/21/2013 1429   CL 103 04/18/2013 1422   CO2 22 07/10/2014 1447   CO2 25 08/21/2013 1429   BUN 52.0* 07/10/2014 1447   BUN 47* 08/21/2013 1429   CREATININE 2.4* 07/10/2014 1447   CREATININE 2.06* 08/21/2013 1429   CREATININE 1.89* 06/28/2012 1425      Component Value Date/Time   CALCIUM 9.4 07/10/2014 1447   CALCIUM 9.3 08/21/2013 1429   CALCIUM 10.5 08/07/2008 0000   ALKPHOS 100 07/10/2014 1447   ALKPHOS 77 06/28/2012 1425   AST 21 07/10/2014 1447   AST 15 06/28/2012 1425   ALT 10 07/10/2014 1447   ALT 10 06/28/2012 1425   BILITOT 0.27 07/10/2014 1447   BILITOT 0.4 06/28/2012 1425       No results found for: LABCA2  No components found for: [id number]  No results for input(s): INR in the last 168 hours.  Urinalysis No results found for: COLORURINE  STUDIES: No results found.   ASSESSMENT: 81 y.o.  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  Status post treatment with Rituxan in 2004 and 2007, with no treatment after November 2007.   (2) anemia, possibly due to CLL progression but certainly worsened by chronic renal insufficiency     (a) aranesp started 02/07/2015, to be repeated every 14 days, goal is Hb >10  (b) feraheme to be given 02/14/2015 and 02/21/2015  (3) rituximab started 02/14/2015, 6-8 weekly treatments planned, goal is WBC <  10  (a) allopurinol started 02/07/2015  (b) hepatitis serologies drawn 02/11/2015 negative  (c) significant first dose reaction.   PLAN:  [place] tolerated the right toxin much better today. We did heavily pretreated her. We are going to repeat that next week and if she does well with that we will try upping the dose.  She has a good understanding of the overall plan. She agrees with it. She knows the goal of treatment in her case is control. She will call with any problems that may develop before her next visit here.[name]  MAGRINAT,GUSTAV C    02/28/2015

## 2015-02-28 NOTE — Progress Notes (Addendum)
1645, transferred to bathroom to void then to lobby via wheelchair.  Kasandra Knudsen also ready for use.  Spouse waiting in lobby.

## 2015-02-28 NOTE — Progress Notes (Signed)
VSS post riruxan and she is in no distress.  Resting well.

## 2015-02-28 NOTE — Telephone Encounter (Signed)
per pof to sch pt appt-MW sch trmt-gave pt copy of sch

## 2015-02-28 NOTE — Progress Notes (Signed)
Tolerating Rituxan well.  Denies head ache, dizziness or visual changes.

## 2015-02-28 NOTE — Progress Notes (Signed)
Starting rituxan at 50 mg/hr.  Reviewed with her to notify us of any changes or symptoms.

## 2015-02-28 NOTE — Telephone Encounter (Signed)
Per staff message and POF I have scheduled appts. Advised scheduler of appts. JMW  

## 2015-03-04 ENCOUNTER — Other Ambulatory Visit: Payer: Self-pay | Admitting: Oncology

## 2015-03-07 ENCOUNTER — Encounter: Payer: Self-pay | Admitting: Nurse Practitioner

## 2015-03-07 ENCOUNTER — Other Ambulatory Visit (HOSPITAL_BASED_OUTPATIENT_CLINIC_OR_DEPARTMENT_OTHER): Payer: Medicare Other

## 2015-03-07 ENCOUNTER — Other Ambulatory Visit: Payer: Self-pay | Admitting: Oncology

## 2015-03-07 ENCOUNTER — Ambulatory Visit (HOSPITAL_BASED_OUTPATIENT_CLINIC_OR_DEPARTMENT_OTHER): Payer: Medicare Other | Admitting: Nurse Practitioner

## 2015-03-07 ENCOUNTER — Ambulatory Visit (HOSPITAL_BASED_OUTPATIENT_CLINIC_OR_DEPARTMENT_OTHER): Payer: Medicare Other

## 2015-03-07 ENCOUNTER — Telehealth: Payer: Self-pay | Admitting: *Deleted

## 2015-03-07 ENCOUNTER — Telehealth: Payer: Self-pay | Admitting: Nurse Practitioner

## 2015-03-07 VITALS — BP 193/69 | HR 73 | Temp 97.6°F | Resp 20

## 2015-03-07 VITALS — BP 185/50 | HR 94 | Temp 98.0°F | Resp 18 | Ht <= 58 in | Wt 139.6 lb

## 2015-03-07 DIAGNOSIS — N183 Chronic kidney disease, stage 3 unspecified: Secondary | ICD-10-CM

## 2015-03-07 DIAGNOSIS — N189 Chronic kidney disease, unspecified: Secondary | ICD-10-CM | POA: Diagnosis not present

## 2015-03-07 DIAGNOSIS — M19019 Primary osteoarthritis, unspecified shoulder: Secondary | ICD-10-CM

## 2015-03-07 DIAGNOSIS — C9110 Chronic lymphocytic leukemia of B-cell type not having achieved remission: Secondary | ICD-10-CM

## 2015-03-07 DIAGNOSIS — E119 Type 2 diabetes mellitus without complications: Secondary | ICD-10-CM

## 2015-03-07 DIAGNOSIS — Z5112 Encounter for antineoplastic immunotherapy: Secondary | ICD-10-CM

## 2015-03-07 DIAGNOSIS — D638 Anemia in other chronic diseases classified elsewhere: Secondary | ICD-10-CM

## 2015-03-07 DIAGNOSIS — D631 Anemia in chronic kidney disease: Secondary | ICD-10-CM | POA: Diagnosis not present

## 2015-03-07 LAB — CBC & DIFF AND RETIC
BASO%: 0.2 % (ref 0.0–2.0)
BASOS ABS: 0.1 10*3/uL (ref 0.0–0.1)
EOS%: 0.4 % (ref 0.0–7.0)
Eosinophils Absolute: 0.2 10*3/uL (ref 0.0–0.5)
HCT: 30.7 % — ABNORMAL LOW (ref 34.8–46.6)
HEMOGLOBIN: 9.4 g/dL — AB (ref 11.6–15.9)
Immature Retic Fract: 14.2 % — ABNORMAL HIGH (ref 1.60–10.00)
LYMPH%: 76.5 % — ABNORMAL HIGH (ref 14.0–49.7)
MCH: 29.3 pg (ref 25.1–34.0)
MCHC: 30.6 g/dL — ABNORMAL LOW (ref 31.5–36.0)
MCV: 95.6 fL (ref 79.5–101.0)
MONO#: 2 10*3/uL — ABNORMAL HIGH (ref 0.1–0.9)
MONO%: 3.7 % (ref 0.0–14.0)
NEUT#: 10.1 10*3/uL — ABNORMAL HIGH (ref 1.5–6.5)
NEUT%: 19.2 % — ABNORMAL LOW (ref 38.4–76.8)
Platelets: 336 10*3/uL (ref 145–400)
RBC: 3.21 10*6/uL — AB (ref 3.70–5.45)
RDW: 16.3 % — AB (ref 11.2–14.5)
RETIC CT ABS: 108.18 10*3/uL — AB (ref 33.70–90.70)
Retic %: 3.37 % — ABNORMAL HIGH (ref 0.70–2.10)
WBC: 52.7 10*3/uL (ref 3.9–10.3)
lymph#: 40.3 10*3/uL — ABNORMAL HIGH (ref 0.9–3.3)

## 2015-03-07 LAB — FERRITIN CHCC: Ferritin: 43 ng/ml (ref 9–269)

## 2015-03-07 LAB — TECHNOLOGIST REVIEW

## 2015-03-07 MED ORDER — HEPARIN SOD (PORK) LOCK FLUSH 100 UNIT/ML IV SOLN
500.0000 [IU] | Freq: Once | INTRAVENOUS | Status: DC | PRN
  Filled 2015-03-07: qty 5

## 2015-03-07 MED ORDER — DIPHENHYDRAMINE HCL 25 MG PO CAPS
ORAL_CAPSULE | ORAL | Status: AC
Start: 2015-03-07 — End: 2015-03-07
  Filled 2015-03-07: qty 2

## 2015-03-07 MED ORDER — SODIUM CHLORIDE 0.9 % IJ SOLN
10.0000 mL | INTRAMUSCULAR | Status: DC | PRN
  Filled 2015-03-07: qty 10

## 2015-03-07 MED ORDER — FAMOTIDINE IN NACL 20-0.9 MG/50ML-% IV SOLN
20.0000 mg | Freq: Two times a day (BID) | INTRAVENOUS | Status: DC
  Administered 2015-03-07: 20 mg via INTRAVENOUS

## 2015-03-07 MED ORDER — DIPHENHYDRAMINE HCL 25 MG PO CAPS
25.0000 mg | ORAL_CAPSULE | Freq: Once | ORAL | Status: AC
  Administered 2015-03-07: 25 mg via ORAL

## 2015-03-07 MED ORDER — METHYLPREDNISOLONE SODIUM SUCC 125 MG IJ SOLR
INTRAMUSCULAR | Status: AC
  Filled 2015-03-07: qty 2

## 2015-03-07 MED ORDER — SODIUM CHLORIDE 0.9 % IV SOLN: INTRAVENOUS | Status: DC

## 2015-03-07 MED ORDER — ACETAMINOPHEN 325 MG PO TABS
ORAL_TABLET | ORAL | Status: AC
  Filled 2015-03-07: qty 2

## 2015-03-07 MED ORDER — SODIUM CHLORIDE 0.9 % IV SOLN
100.0000 mg | Freq: Once | INTRAVENOUS | Status: AC
  Administered 2015-03-07: 100 mg via INTRAVENOUS
  Filled 2015-03-07: qty 10

## 2015-03-07 MED ORDER — FAMOTIDINE IN NACL 20-0.9 MG/50ML-% IV SOLN
INTRAVENOUS | Status: AC
  Filled 2015-03-07: qty 50

## 2015-03-07 MED ORDER — SODIUM CHLORIDE 0.9 % IV SOLN
INTRAVENOUS | Status: DC
  Administered 2015-03-07: 12:00:00 via INTRAVENOUS

## 2015-03-07 MED ORDER — METHYLPREDNISOLONE SODIUM SUCC 125 MG IJ SOLR
125.0000 mg | Freq: Every day | INTRAMUSCULAR | Status: DC
  Administered 2015-03-07: 125 mg via INTRAVENOUS

## 2015-03-07 MED ORDER — ACETAMINOPHEN 325 MG PO TABS
650.0000 mg | ORAL_TABLET | Freq: Once | ORAL | Status: AC
  Administered 2015-03-07: 650 mg via ORAL

## 2015-03-07 NOTE — Telephone Encounter (Signed)
per pof to sch pt appt-sent MW email to sch pt trmt-pt to get updated copy b4 leaving °

## 2015-03-07 NOTE — Telephone Encounter (Signed)
Per staff message and POF I have scheduled appts. Advised scheduler of appts. JMW  

## 2015-03-07 NOTE — Progress Notes (Signed)
ID: [name]   DOB: [birth year]  MR#: [id number]  CSN#:[id number]  PCP: Blain Pais, MD GYN: SU:  OTHER MD: Susa Day  CHIEF COMPLAINT: Chronic lymphoid leukemia, anemia of renal failure  CURRENT TREATMENT: Aranesp,  Low-dose prednisone  INTERVAL HISTORY: [name] returns for follow up of her chronic lymphoid leukemia, accompanied by her husband [name]. She is doing well today with few complaints. She tolerated her 2nd round of rituxamib much better with a host of premed precautions. She is due for cycle 3 today.   REVIEW OF SYSTEMS: [name] denies fevers, chills, nausea, or vomiting. She moves her bowels well but has some stress urinary incontinence. She has no night sweats, weakness or fatigue. She uses a cane to ambulate and has some arthritis pain. A detailed review of systems is otherwise stable.  PAST MEDICAL HISTORY: Past Medical History  Diagnosis Date  . Diabetes mellitus   . Hyperlipidemia   . Hypertension   . Carotid stenosis, right 08/2005    40-60%  . History of colonic polyps 2002    Normal colonoscopy 08/2004  . CKD (chronic kidney disease) stage 2, GFR 60-89 ml/min   . CLL (chronic lymphocytic leukemia)     Dr. [name]. s/p Rituxan  . Pulse irregularity     Etiology->frequent PACs, normal EKG    FAMILY HISTORY No family history on file.  GYNECOLOGIC HISTORY: GX P0  SOCIAL HISTORY: She is originally from [place]. 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 They have no children. They are Catholic.  ADVANCED DIRECTIVES: in place  HEALTH MAINTENANCE: History  Substance Use Topics  . Smoking status: Former Research scientist (life sciences)  . Smokeless tobacco: Not on file  . Alcohol Use: Not on file     Colonoscopy: Buccini  PAP:  Bone density:  Lipid panel:  No Known Allergies  Current Outpatient Prescriptions  Medication Sig Dispense Refill  . AGGRENOX 25-200 MG per 12 hr capsule TAKE ONE CAPSULE BY MOUTH TWICE A DAY 200 capsule 2  .  allopurinol (ZYLOPRIM) 100 MG tablet Take 1 tablet (100 mg total) by mouth daily. 90 tablet 3  . Calcium Carbonate-Vitamin D (CALCIUM PLUS VITAMIN D PO) Take 1 tablet by mouth 2 (two) times daily.    . Cranberry 450 MG CAPS Take 1 capsule by mouth daily.    . furosemide (LASIX) 40 MG tablet TAKE 1 TABLET BY MOUTH EVERY DAY 90 tablet 3  . losartan (COZAAR) 100 MG tablet TAKE 1 TABLET BY MOUTH EVERY DAY 100 tablet 3  . Multiple Vitamins-Minerals (MACULAR VITAMIN BENEFIT PO) Take 1 tablet by mouth 2 (two) times daily.    . Multiple Vitamins-Minerals (MULTIVITAMINS THER. W/MINERALS) TABS Take 1 tablet by mouth daily.    . Omega-3 Fatty Acids (FISH OIL) 1200 MG CAPS Take 1 capsule by mouth daily.    [name] omeprazole (PRILOSEC) 20 MG capsule Take 1 capsule (20 mg total) by mouth daily. 100 capsule 12  . predniSONE (DELTASONE) 10 MG tablet Take one tablet (10 mg) daily with breakfast 30 tablet 6  . simvastatin (ZOCOR) 20 MG tablet TAKE 1 TABLET BY MOUTH AT BEDTIME 100 tablet 1  . acetaminophen (TYLENOL) 500 MG tablet Take 1,000 mg by mouth 2 (two) times daily as needed. For pain    . Polyethyl Glycol-Propyl Glycol (SYSTANE OP) Place 1 drop into both eyes 2 (two) times daily.    . sodium chloride (OCEAN NASAL SPRAY) 0.65 % nasal spray Place 1 spray into the  nose as needed for congestion (You may use it 2-3 times per day.). (Patient not taking: Reported on 12/03/2014) 45 mL 3  . traMADol-acetaminophen (ULTRACET) 37.5-325 MG per tablet Take 1-2 tablets by mouth every 6 (six) hours as needed for moderate pain. (Patient not taking: Reported on 12/03/2014) 30 tablet 3   No current facility-administered medications for this visit.    OBJECTIVE: Elderly white woman examined in a wheelchair Filed Vitals:   03/07/15 1002  BP: 185/50  Pulse: 94  Temp: 98 F (36.7 C)  Resp: 18     Body mass index is 32.45 kg/(m^2).    ECOG FS: 2  Skin: warm, dry  HEENT: sclerae anicteric, conjunctivae pink, oropharynx clear. No  thrush or mucositis.  Lymph Nodes: No cervical or supraclavicular lymphadenopathy  Lungs: clear to auscultation bilaterally, no rales, wheezes, or rhonci  Heart: regular rate and rhythm  Abdomen: round, soft, non tender, positive bowel sounds  Musculoskeletal: No focal spinal tenderness,+1 edema to her bilateral lower extremities  Neuro: non focal, well oriented, positive affect  Breasts; deferred    LAB RESULTS: Lab Results  Component Value Date   WBC 52.7* 03/07/2015   NEUTROABS 10.1* 03/07/2015   HGB 9.4* 03/07/2015   HCT 30.7* 03/07/2015   MCV 95.6 03/07/2015   PLT 336 03/07/2015      Chemistry      Component Value Date/Time   NA 143 07/10/2014 1447   NA 136 08/21/2013 1429   K 4.3 07/10/2014 1447   K 4.6 08/21/2013 1429   CL 95* 08/21/2013 1429   CL 103 04/18/2013 1422   CO2 22 07/10/2014 1447   CO2 25 08/21/2013 1429   BUN 52.0* 07/10/2014 1447   BUN 47* 08/21/2013 1429   CREATININE 2.4* 07/10/2014 1447   CREATININE 2.06* 08/21/2013 1429   CREATININE 1.89* 06/28/2012 1425      Component Value Date/Time   CALCIUM 9.4 07/10/2014 1447   CALCIUM 9.3 08/21/2013 1429   CALCIUM 10.5 08/07/2008 0000   ALKPHOS 100 07/10/2014 1447   ALKPHOS 77 06/28/2012 1425   AST 21 07/10/2014 1447   AST 15 06/28/2012 1425   ALT 10 07/10/2014 1447   ALT 10 06/28/2012 1425   BILITOT 0.27 07/10/2014 1447   BILITOT 0.4 06/28/2012 1425       No results found for: LABCA2  No components found for: [id number]  No results for input(s): INR in the last 168 hours.  Urinalysis No results found for: COLORURINE  STUDIES: No results found.   ASSESSMENT: 81 y.o.  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  Status post treatment with Rituxan in 2004 and 2007, with no treatment after November 2007.   (2) anemia, possibly due to CLL progression but certainly  worsened by chronic renal insufficiency     (a) aranesp started 02/07/2015, to be repeated every 14 days, goal is Hb >10  (b) feraheme to be given 02/14/2015 and 02/21/2015  (3) rituximab to be started 02/14/2015, 6-8 weekly treatments planned, goal is WBC <10  (a) allopurinol started 02/07/2015  (b) hepatitis serologies drawn 02/11/2015 negative  PLAN:  [name] is doing well today. The labs were reviewed in detail and were favorable. Her WBC continues to decline. She will proceed with cycle 3 of rituxamib today as planned. As stated in the last progress note, if she does well with this treatment with all of the premeds on  board, we will increase the dose with the next treatment.   [name] will continue rituximab weekly. Her next office visit will be in 2 weeks. She understands and agrees with this plan. She knows the goal of treatment in her case is control. She has been encouraged to call with any issues that might arise before her next visit here.   Genelle Gather Kindal Ponti    03/07/2015

## 2015-03-07 NOTE — Progress Notes (Signed)
Informed Dr. [name] of elevated blood pressure. Order received to discontinue IV fluids.

## 2015-03-07 NOTE — Patient Instructions (Signed)
[facility] Discharge Instructions for Patients Receiving Chemotherapy  Today you received the following chemotherapy agents Rituxan  To help prevent nausea and vomiting after your treatment, we encourage you to take your nausea medication as needed   If you develop nausea and vomiting that is not controlled by your nausea medication, call the clinic.   BELOW ARE SYMPTOMS THAT SHOULD BE REPORTED IMMEDIATELY:  *FEVER GREATER THAN 100.5 F  *CHILLS WITH OR WITHOUT FEVER  NAUSEA AND VOMITING THAT IS NOT CONTROLLED WITH YOUR NAUSEA MEDICATION  *UNUSUAL SHORTNESS OF BREATH  *UNUSUAL BRUISING OR BLEEDING  TENDERNESS IN MOUTH AND THROAT WITH OR WITHOUT PRESENCE OF ULCERS  *URINARY PROBLEMS  *BOWEL PROBLEMS  UNUSUAL RASH Items with * indicate a potential emergency and should be followed up as soon as possible.  Feel free to call the clinic you have any questions or concerns. The clinic phone number is [phone].  Please show the [facility] at check-in to the Emergency Department and triage nurse.

## 2015-03-14 ENCOUNTER — Other Ambulatory Visit: Payer: Self-pay | Admitting: Oncology

## 2015-03-14 ENCOUNTER — Ambulatory Visit (HOSPITAL_BASED_OUTPATIENT_CLINIC_OR_DEPARTMENT_OTHER): Payer: Medicare Other

## 2015-03-14 ENCOUNTER — Other Ambulatory Visit (HOSPITAL_BASED_OUTPATIENT_CLINIC_OR_DEPARTMENT_OTHER): Payer: Medicare Other

## 2015-03-14 ENCOUNTER — Other Ambulatory Visit: Payer: Medicare Other

## 2015-03-14 VITALS — BP 154/60 | HR 76 | Temp 98.1°F | Resp 18

## 2015-03-14 DIAGNOSIS — D631 Anemia in chronic kidney disease: Secondary | ICD-10-CM

## 2015-03-14 DIAGNOSIS — R269 Unspecified abnormalities of gait and mobility: Secondary | ICD-10-CM

## 2015-03-14 DIAGNOSIS — Z7189 Other specified counseling: Secondary | ICD-10-CM

## 2015-03-14 DIAGNOSIS — E119 Type 2 diabetes mellitus without complications: Secondary | ICD-10-CM

## 2015-03-14 DIAGNOSIS — I10 Essential (primary) hypertension: Secondary | ICD-10-CM

## 2015-03-14 DIAGNOSIS — Z5112 Encounter for antineoplastic immunotherapy: Secondary | ICD-10-CM

## 2015-03-14 DIAGNOSIS — Z0000 Encounter for general adult medical examination without abnormal findings: Secondary | ICD-10-CM

## 2015-03-14 DIAGNOSIS — N183 Chronic kidney disease, stage 3 unspecified: Secondary | ICD-10-CM

## 2015-03-14 DIAGNOSIS — T7840XD Allergy, unspecified, subsequent encounter: Secondary | ICD-10-CM

## 2015-03-14 DIAGNOSIS — N189 Chronic kidney disease, unspecified: Secondary | ICD-10-CM

## 2015-03-14 DIAGNOSIS — D638 Anemia in other chronic diseases classified elsewhere: Secondary | ICD-10-CM

## 2015-03-14 DIAGNOSIS — K219 Gastro-esophageal reflux disease without esophagitis: Secondary | ICD-10-CM

## 2015-03-14 DIAGNOSIS — C9110 Chronic lymphocytic leukemia of B-cell type not having achieved remission: Secondary | ICD-10-CM

## 2015-03-14 DIAGNOSIS — M19019 Primary osteoarthritis, unspecified shoulder: Secondary | ICD-10-CM

## 2015-03-14 DIAGNOSIS — E1122 Type 2 diabetes mellitus with diabetic chronic kidney disease: Secondary | ICD-10-CM

## 2015-03-14 LAB — CBC & DIFF AND RETIC
BASO%: 0.1 % (ref 0.0–2.0)
BASOS ABS: 0 10*3/uL (ref 0.0–0.1)
EOS%: 0.7 % (ref 0.0–7.0)
Eosinophils Absolute: 0.1 10*3/uL (ref 0.0–0.5)
HCT: 30.3 % — ABNORMAL LOW (ref 34.8–46.6)
HGB: 9.4 g/dL — ABNORMAL LOW (ref 11.6–15.9)
IMMATURE RETIC FRACT: 5.5 % (ref 1.60–10.00)
LYMPH#: 6.4 10*3/uL — AB (ref 0.9–3.3)
LYMPH%: 42.1 % (ref 14.0–49.7)
MCH: 29 pg (ref 25.1–34.0)
MCHC: 31 g/dL — ABNORMAL LOW (ref 31.5–36.0)
MCV: 93.5 fL (ref 79.5–101.0)
MONO#: 0.7 10*3/uL (ref 0.1–0.9)
MONO%: 4.8 % (ref 0.0–14.0)
NEUT#: 8 10*3/uL — ABNORMAL HIGH (ref 1.5–6.5)
NEUT%: 52.3 % (ref 38.4–76.8)
Platelets: 287 10*3/uL (ref 145–400)
RBC: 3.24 10*6/uL — ABNORMAL LOW (ref 3.70–5.45)
RDW: 16.1 % — AB (ref 11.2–14.5)
RETIC CT ABS: 65.12 10*3/uL (ref 33.70–90.70)
Retic %: 2.01 % (ref 0.70–2.10)
WBC: 15.3 10*3/uL — ABNORMAL HIGH (ref 3.9–10.3)

## 2015-03-14 LAB — FERRITIN CHCC: Ferritin: 60 ng/ml (ref 9–269)

## 2015-03-14 MED ORDER — ACETAMINOPHEN 325 MG PO TABS
650.0000 mg | ORAL_TABLET | Freq: Once | ORAL | Status: AC
  Administered 2015-03-14: 650 mg via ORAL

## 2015-03-14 MED ORDER — DIPHENHYDRAMINE HCL 25 MG PO CAPS
25.0000 mg | ORAL_CAPSULE | Freq: Once | ORAL | Status: AC
  Administered 2015-03-14: 25 mg via ORAL

## 2015-03-14 MED ORDER — DIPHENHYDRAMINE HCL 25 MG PO CAPS
ORAL_CAPSULE | ORAL | Status: AC
  Filled 2015-03-14: qty 1

## 2015-03-14 MED ORDER — METHYLPREDNISOLONE SODIUM SUCC 125 MG IJ SOLR
INTRAMUSCULAR | Status: AC
  Filled 2015-03-14: qty 2

## 2015-03-14 MED ORDER — ACETAMINOPHEN 325 MG PO TABS
ORAL_TABLET | ORAL | Status: AC
  Filled 2015-03-14: qty 2

## 2015-03-14 MED ORDER — FAMOTIDINE IN NACL 20-0.9 MG/50ML-% IV SOLN
INTRAVENOUS | Status: AC
  Filled 2015-03-14: qty 50

## 2015-03-14 MED ORDER — SODIUM CHLORIDE 0.9 % IV SOLN
100.0000 mg | Freq: Once | INTRAVENOUS | Status: AC
  Administered 2015-03-14: 100 mg via INTRAVENOUS
  Filled 2015-03-14: qty 10

## 2015-03-14 MED ORDER — SODIUM CHLORIDE 0.9 % IV SOLN
INTRAVENOUS | Status: DC
  Administered 2015-03-14: 10:00:00 via INTRAVENOUS

## 2015-03-14 MED ORDER — METHYLPREDNISOLONE SODIUM SUCC 125 MG IJ SOLR
125.0000 mg | Freq: Every day | INTRAMUSCULAR | Status: DC
Start: 2015-03-14 — End: 2015-03-14
  Administered 2015-03-14: 125 mg via INTRAVENOUS

## 2015-03-14 MED ORDER — DARBEPOETIN ALFA 200 MCG/0.4ML IJ SOSY
200.0000 ug | PREFILLED_SYRINGE | Freq: Once | INTRAMUSCULAR | Status: AC
  Administered 2015-03-14: 200 ug via SUBCUTANEOUS
  Filled 2015-03-14: qty 0.4

## 2015-03-14 MED ORDER — SODIUM CHLORIDE 0.9 % IV SOLN: INTRAVENOUS | Status: DC

## 2015-03-14 MED ORDER — FAMOTIDINE IN NACL 20-0.9 MG/50ML-% IV SOLN
20.0000 mg | Freq: Two times a day (BID) | INTRAVENOUS | Status: DC
  Administered 2015-03-14: 20 mg via INTRAVENOUS

## 2015-03-14 NOTE — Patient Instructions (Signed)
[facility] Discharge Instructions for Patients Receiving Chemotherapy/Biotherapy  Today you received the following biotheraputic agent called rituxan. Patient information about this drug islisted at the end.  To help prevent nausea and vomiting after your treatment, we encourage you to take your nausea medication as prescribed.   If you develop nausea and vomiting that is not controlled by your nausea medication, call the clinic.   BELOW ARE SYMPTOMS THAT SHOULD BE REPORTED IMMEDIATELY:  *FEVER GREATER THAN 100.5 F  *CHILLS WITH OR WITHOUT FEVER  NAUSEA AND VOMITING THAT IS NOT CONTROLLED WITH YOUR NAUSEA MEDICATION  *UNUSUAL SHORTNESS OF BREATH  *UNUSUAL BRUISING OR BLEEDING  TENDERNESS IN MOUTH AND THROAT WITH OR WITHOUT PRESENCE OF ULCERS  *URINARY PROBLEMS  *BOWEL PROBLEMS  UNUSUAL RASH Items with * indicate a potential emergency and should be followed up as soon as possible.  Feel free to call the clinic you have any questions or concerns. The clinic phone number is [phone].  Please show the [facility] at check-in to the Emergency Department and triage nurse.  Rituximab injection What is this medicine? RITUXIMAB (ri TUX i mab) is a monoclonal antibody. This medicine changes the way the body's immune system works. It is used commonly to treat non-Hodgkin's lymphoma and other conditions. In cancer cells, this drug targets a specific protein within cancer cells and stops the cancer cells from growing. It is also used to treat rhuematoid arthritis (RA). In RA, this medicine slow the inflammatory process and help reduce joint pain and swelling. This medicine is often used with other cancer or arthritis medications. This medicine may be used for other purposes; ask your health care provider or pharmacist if you have questions. COMMON BRAND NAME(S): Rituxan What should I tell my health care provider before I take this medicine? They need to know if  you have any of these conditions: -blood disorders -heart disease -history of hepatitis B -infection (especially a virus infection such as chickenpox, cold sores, or herpes) -irregular heartbeat -kidney disease -lung or breathing disease, like asthma -lupus -an unusual or allergic reaction to rituximab, mouse proteins, other medicines, foods, dyes, or preservatives -pregnant or trying to get pregnant -breast-feeding How should I use this medicine? This medicine is for infusion into a vein. It is administered in a hospital or clinic by a specially trained health care professional. A special MedGuide will be given to you by the pharmacist with each prescription and refill. Be sure to read this information carefully each time. Talk to your pediatrician regarding the use of this medicine in children. This medicine is not approved for use in children. Overdosage: If you think you have taken too much of this medicine contact a poison control center or emergency room at once. NOTE: This medicine is only for you. Do not share this medicine with others. What if I miss a dose? It is important not to miss a dose. Call your doctor or health care professional if you are unable to keep an appointment. What may interact with this medicine? -cisplatin -medicines for blood pressure -some other medicines for arthritis -vaccines This list may not describe all possible interactions. Give your health care provider a list of all the medicines, herbs, non-prescription drugs, or dietary supplements you use. Also tell them if you smoke, drink alcohol, or use illegal drugs. Some items may interact with your medicine. What should I watch for while using this medicine? Report any side effects that you notice during your treatment right  away, such as changes in your breathing, fever, chills, dizziness or lightheadedness. These effects are more common with the first dose. Visit your prescriber or health care  professional for checks on your progress. You will need to have regular blood work. Report any other side effects. The side effects of this medicine can continue after you finish your treatment. Continue your course of treatment even though you feel ill unless your doctor tells you to stop. Call your doctor or health care professional for advice if you get a fever, chills or sore throat, or other symptoms of a cold or flu. Do not treat yourself. This drug decreases your body's ability to fight infections. Try to avoid being around people who are sick. This medicine may increase your risk to bruise or bleed. Call your doctor or health care professional if you notice any unusual bleeding. Be careful brushing and flossing your teeth or using a toothpick because you may get an infection or bleed more easily. If you have any dental work done, tell your dentist you are receiving this medicine. Avoid taking products that contain aspirin, acetaminophen, ibuprofen, naproxen, or ketoprofen unless instructed by your doctor. These medicines may hide a fever. Do not become pregnant while taking this medicine. Women should inform their doctor if they wish to become pregnant or think they might be pregnant. There is a potential for serious side effects to an unborn child. Talk to your health care professional or pharmacist for more information. Do not breast-feed an infant while taking this medicine. What side effects may I notice from receiving this medicine? Side effects that you should report to your doctor or health care professional as soon as possible: -allergic reactions like skin rash, itching or hives, swelling of the face, lips, or tongue -low blood counts - this medicine may decrease the number of white blood cells, red blood cells and platelets. You may be at increased risk for infections and bleeding. -signs of infection - fever or chills, cough, sore throat, pain or difficulty passing urine -signs of  decreased platelets or bleeding - bruising, pinpoint red spots on the skin, black, tarry stools, blood in the urine -signs of decreased red blood cells - unusually weak or tired, fainting spells, lightheadedness -breathing problems -confused, not responsive -chest pain -fast, irregular heartbeat -feeling faint or lightheaded, falls -mouth sores -redness, blistering, peeling or loosening of the skin, including inside the mouth -stomach pain -swelling of the ankles, feet, or hands -trouble passing urine or change in the amount of urine Side effects that usually do not require medical attention (report to your doctor or other health care professional if they continue or are bothersome): -anxiety -headache -loss of appetite -muscle aches -nausea -night sweats This list may not describe all possible side effects. Call your doctor for medical advice about side effects. You may report side effects to FDA at 1-800-FDA-1088. Where should I keep my medicine? This drug is given in a hospital or clinic and will not be stored at home. NOTE: This sheet is a summary. It may not cover all possible information. If you have questions about this medicine, talk to your doctor, pharmacist, or health care provider.  2015, Elsevier/Gold Standard. (2008-07-09 14:04:59)

## 2015-03-14 NOTE — Progress Notes (Signed)
Reports tah tlegs are cold. Several blankets applied to lower extremities.

## 2015-03-20 ENCOUNTER — Ambulatory Visit (HOSPITAL_BASED_OUTPATIENT_CLINIC_OR_DEPARTMENT_OTHER): Payer: Medicare Other | Admitting: Nurse Practitioner

## 2015-03-20 ENCOUNTER — Encounter: Payer: Self-pay | Admitting: Nurse Practitioner

## 2015-03-20 ENCOUNTER — Other Ambulatory Visit (HOSPITAL_BASED_OUTPATIENT_CLINIC_OR_DEPARTMENT_OTHER): Payer: Medicare Other

## 2015-03-20 VITALS — BP 182/78 | HR 79 | Temp 99.1°F | Resp 17 | Ht <= 58 in | Wt 134.1 lb

## 2015-03-20 DIAGNOSIS — D631 Anemia in chronic kidney disease: Secondary | ICD-10-CM | POA: Diagnosis not present

## 2015-03-20 DIAGNOSIS — D638 Anemia in other chronic diseases classified elsewhere: Secondary | ICD-10-CM

## 2015-03-20 DIAGNOSIS — N182 Chronic kidney disease, stage 2 (mild): Secondary | ICD-10-CM

## 2015-03-20 DIAGNOSIS — N183 Chronic kidney disease, stage 3 unspecified: Secondary | ICD-10-CM

## 2015-03-20 DIAGNOSIS — M19019 Primary osteoarthritis, unspecified shoulder: Secondary | ICD-10-CM

## 2015-03-20 DIAGNOSIS — E119 Type 2 diabetes mellitus without complications: Secondary | ICD-10-CM

## 2015-03-20 DIAGNOSIS — C9110 Chronic lymphocytic leukemia of B-cell type not having achieved remission: Secondary | ICD-10-CM

## 2015-03-20 LAB — CBC & DIFF AND RETIC
BASO%: 0.1 % (ref 0.0–2.0)
BASOS ABS: 0 10*3/uL (ref 0.0–0.1)
EOS%: 0.9 % (ref 0.0–7.0)
Eosinophils Absolute: 0.1 10*3/uL (ref 0.0–0.5)
HCT: 31 % — ABNORMAL LOW (ref 34.8–46.6)
HGB: 9.8 g/dL — ABNORMAL LOW (ref 11.6–15.9)
IMMATURE RETIC FRACT: 14.5 % — AB (ref 1.60–10.00)
LYMPH#: 4.3 10*3/uL — AB (ref 0.9–3.3)
LYMPH%: 31.2 % (ref 14.0–49.7)
MCH: 29.2 pg (ref 25.1–34.0)
MCHC: 31.6 g/dL (ref 31.5–36.0)
MCV: 92.3 fL (ref 79.5–101.0)
MONO#: 1 10*3/uL — AB (ref 0.1–0.9)
MONO%: 7.4 % (ref 0.0–14.0)
NEUT#: 8.4 10*3/uL — ABNORMAL HIGH (ref 1.5–6.5)
NEUT%: 60.4 % (ref 38.4–76.8)
PLATELETS: 314 10*3/uL (ref 145–400)
RBC: 3.36 10*6/uL — ABNORMAL LOW (ref 3.70–5.45)
RDW: 16.3 % — ABNORMAL HIGH (ref 11.2–14.5)
Retic %: 3.66 % — ABNORMAL HIGH (ref 0.70–2.10)
Retic Ct Abs: 122.98 10*3/uL — ABNORMAL HIGH (ref 33.70–90.70)
WBC: 13.9 10*3/uL — AB (ref 3.9–10.3)

## 2015-03-20 LAB — FERRITIN CHCC: Ferritin: 76 ng/ml (ref 9–269)

## 2015-03-20 NOTE — Progress Notes (Signed)
ID: [name]   DOB: [birth year]  MR#: [id number]  CSN#:[id number]  PCP: Blain Pais, MD GYN: SU:  OTHER MD: Susa Day  CHIEF COMPLAINT: Chronic lymphoid leukemia, anemia of renal failure  CURRENT TREATMENT: Aranesp,  Low-dose prednisone  INTERVAL HISTORY: [name] returns for follow up of her chronic lymphoid leukemia. Her husband was not able to attend today's appointment because of lack of sleep and acute confusion this morning. [name] took a cab into the cancer center today. She is due for cycle 4 of rituximab tomorrow. She tolerated the last infusion well with no reactions.   REVIEW OF SYSTEMS: [name] denies fevers, chills, nausea, or vomiting. She moves her bowels well but has some stress urinary incontinence. She has no night sweats, weakness or fatigue. She uses a cane to ambulate and has some arthritis pain. Because of her husband's condition she has not been sleeping well either, and she is stressed. A detailed review of systems is otherwise stable.  PAST MEDICAL HISTORY: Past Medical History  Diagnosis Date  . Diabetes mellitus   . Hyperlipidemia   . Hypertension   . Carotid stenosis, right 08/2005    40-60%  . History of colonic polyps 2002    Normal colonoscopy 08/2004  . CKD (chronic kidney disease) stage 2, GFR 60-89 ml/min   . CLL (chronic lymphocytic leukemia)     Dr. [name]. s/p Rituxan  . Pulse irregularity     Etiology->frequent PACs, normal EKG    FAMILY HISTORY No family history on file.  GYNECOLOGIC HISTORY: GX P0  SOCIAL HISTORY: She is originally from [place]. 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 They have no children. They are Catholic.  ADVANCED DIRECTIVES: in place  HEALTH MAINTENANCE: History  Substance Use Topics  . Smoking status: Former Research scientist (life sciences)  . Smokeless tobacco: Not on file  . Alcohol Use: Not on file     Colonoscopy: Buccini  PAP:  Bone density:  Lipid panel:  No Known Allergies  Current  Outpatient Prescriptions  Medication Sig Dispense Refill  . acetaminophen (TYLENOL) 500 MG tablet Take 1,000 mg by mouth 2 (two) times daily as needed. For pain    . AGGRENOX 25-200 MG per 12 hr capsule TAKE ONE CAPSULE BY MOUTH TWICE A DAY 200 capsule 2  . allopurinol (ZYLOPRIM) 100 MG tablet Take 1 tablet (100 mg total) by mouth daily. 90 tablet 3  . Calcium Carbonate-Vitamin D (CALCIUM PLUS VITAMIN D PO) Take 1 tablet by mouth 2 (two) times daily.    . Cranberry 450 MG CAPS Take 1 capsule by mouth daily.    . furosemide (LASIX) 40 MG tablet TAKE 1 TABLET BY MOUTH EVERY DAY 90 tablet 3  . losartan (COZAAR) 100 MG tablet TAKE 1 TABLET BY MOUTH EVERY DAY 100 tablet 3  . Multiple Vitamins-Minerals (MACULAR VITAMIN BENEFIT PO) Take 1 tablet by mouth 2 (two) times daily.    . Multiple Vitamins-Minerals (MULTIVITAMINS THER. W/MINERALS) TABS Take 1 tablet by mouth daily.    . Omega-3 Fatty Acids (FISH OIL) 1200 MG CAPS Take 1 capsule by mouth daily.    [name] omeprazole (PRILOSEC) 20 MG capsule Take 1 capsule (20 mg total) by mouth daily. 100 capsule 12  . Polyethyl Glycol-Propyl Glycol (SYSTANE OP) Place 1 drop into both eyes 2 (two) times daily.    . predniSONE (DELTASONE) 10 MG tablet Take one tablet (10 mg) daily with breakfast 30 tablet 6  . simvastatin (ZOCOR) 20 MG  tablet TAKE 1 TABLET BY MOUTH AT BEDTIME 100 tablet 1  . sodium chloride (OCEAN NASAL SPRAY) 0.65 % nasal spray Place 1 spray into the nose as needed for congestion (You may use it 2-3 times per day.). (Patient not taking: Reported on 12/03/2014) 45 mL 3  . traMADol-acetaminophen (ULTRACET) 37.5-325 MG per tablet Take 1-2 tablets by mouth every 6 (six) hours as needed for moderate pain. (Patient not taking: Reported on 12/03/2014) 30 tablet 3   No current facility-administered medications for this visit.    OBJECTIVE: Elderly white woman examined in a wheelchair Filed Vitals:   03/20/15 1013  BP: 182/78  Pulse:   Temp:   Resp:       Body mass index is 31.16 kg/(m^2).    ECOG FS: 2  Skin: warm, dry  HEENT: sclerae anicteric, conjunctivae pink, oropharynx clear. No thrush or mucositis.  Lymph Nodes: No cervical or supraclavicular lymphadenopathy  Lungs: clear to auscultation bilaterally, no rales, wheezes, or rhonci  Heart: regular rate and rhythm  Abdomen: round, soft, non tender, positive bowel sounds  Musculoskeletal: No focal spinal tenderness,+1 edema to her bilateral lower extremities  Neuro: non focal, well oriented, positive affect  Breasts; deferred   LAB RESULTS: Lab Results  Component Value Date   WBC 13.9* 03/20/2015   NEUTROABS 8.4* 03/20/2015   HGB 9.8* 03/20/2015   HCT 31.0* 03/20/2015   MCV 92.3 03/20/2015   PLT 314 03/20/2015      Chemistry      Component Value Date/Time   NA 143 07/10/2014 1447   NA 136 08/21/2013 1429   K 4.3 07/10/2014 1447   K 4.6 08/21/2013 1429   CL 95* 08/21/2013 1429   CL 103 04/18/2013 1422   CO2 22 07/10/2014 1447   CO2 25 08/21/2013 1429   BUN 52.0* 07/10/2014 1447   BUN 47* 08/21/2013 1429   CREATININE 2.4* 07/10/2014 1447   CREATININE 2.06* 08/21/2013 1429   CREATININE 1.89* 06/28/2012 1425      Component Value Date/Time   CALCIUM 9.4 07/10/2014 1447   CALCIUM 9.3 08/21/2013 1429   CALCIUM 10.5 08/07/2008 0000   ALKPHOS 100 07/10/2014 1447   ALKPHOS 77 06/28/2012 1425   AST 21 07/10/2014 1447   AST 15 06/28/2012 1425   ALT 10 07/10/2014 1447   ALT 10 06/28/2012 1425   BILITOT 0.27 07/10/2014 1447   BILITOT 0.4 06/28/2012 1425       No results found for: LABCA2  No components found for: CZYSA630  No results for input(s): INR in the last 168 hours.  Urinalysis No results found for: COLORURINE  STUDIES: No results found.   ASSESSMENT: 81 y.o.  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  Status post treatment  with Rituxan in 2004 and 2007, with no treatment after November 2007.   (2) anemia, possibly due to CLL progression but certainly worsened by chronic renal insufficiency     (a) aranesp started 02/07/2015, to be repeated every 14 days, goal is Hb >10  (b) feraheme to be given 02/14/2015 and 02/21/2015  (3) rituximab to be started 02/14/2015, 6-8 weekly treatments planned, goal is WBC <10  (a) allopurinol started 02/07/2015  (b) hepatitis serologies drawn 02/11/2015 negative  PLAN:  The labs were reviewed in detail. Her WBC is now down to 13.9, with an absolute lymphocyte count of 4.3. She will proceed with her 4th cycle  of rituxan tomorrow. I will consult with Dr. [name] about possible dose adjustments as she is tolerating 100mg  well at this point.   [name] will continue weekly treatments. Dr. [name] will drop in on one of her treatments in late May. She understands and agrees with this plan. She has been encouraged to call with any issues that might arise before her next visit here.  Genelle Gather Boelter    03/20/2015

## 2015-03-21 ENCOUNTER — Other Ambulatory Visit: Payer: Medicare Other

## 2015-03-21 ENCOUNTER — Ambulatory Visit (HOSPITAL_BASED_OUTPATIENT_CLINIC_OR_DEPARTMENT_OTHER): Payer: Medicare Other

## 2015-03-21 VITALS — BP 168/53 | HR 76 | Temp 98.4°F | Resp 18 | Ht <= 58 in

## 2015-03-21 DIAGNOSIS — Z5112 Encounter for antineoplastic immunotherapy: Secondary | ICD-10-CM

## 2015-03-21 DIAGNOSIS — C9110 Chronic lymphocytic leukemia of B-cell type not having achieved remission: Secondary | ICD-10-CM | POA: Diagnosis not present

## 2015-03-21 MED ORDER — METHYLPREDNISOLONE SODIUM SUCC 125 MG IJ SOLR
125.0000 mg | Freq: Every day | INTRAMUSCULAR | Status: DC
  Administered 2015-03-21: 125 mg via INTRAVENOUS

## 2015-03-21 MED ORDER — DIPHENHYDRAMINE HCL 25 MG PO CAPS
ORAL_CAPSULE | ORAL | Status: AC
  Filled 2015-03-21: qty 1

## 2015-03-21 MED ORDER — ACETAMINOPHEN 325 MG PO TABS
650.0000 mg | ORAL_TABLET | Freq: Once | ORAL | Status: AC
  Administered 2015-03-21: 650 mg via ORAL

## 2015-03-21 MED ORDER — METHYLPREDNISOLONE SODIUM SUCC 125 MG IJ SOLR
INTRAMUSCULAR | Status: AC
  Filled 2015-03-21: qty 2

## 2015-03-21 MED ORDER — SODIUM CHLORIDE 0.9 % IV SOLN
INTRAVENOUS | Status: DC
  Administered 2015-03-21: 13:00:00 via INTRAVENOUS

## 2015-03-21 MED ORDER — ACETAMINOPHEN 325 MG PO TABS
ORAL_TABLET | ORAL | Status: AC
  Filled 2015-03-21: qty 2

## 2015-03-21 MED ORDER — DIPHENHYDRAMINE HCL 25 MG PO CAPS
25.0000 mg | ORAL_CAPSULE | Freq: Once | ORAL | Status: AC
  Administered 2015-03-21: 25 mg via ORAL

## 2015-03-21 MED ORDER — FAMOTIDINE IN NACL 20-0.9 MG/50ML-% IV SOLN
20.0000 mg | Freq: Two times a day (BID) | INTRAVENOUS | Status: DC
  Administered 2015-03-21: 20 mg via INTRAVENOUS

## 2015-03-21 MED ORDER — SODIUM CHLORIDE 0.9 % IV SOLN
100.0000 mg | Freq: Once | INTRAVENOUS | Status: AC
  Administered 2015-03-21: 100 mg via INTRAVENOUS
  Filled 2015-03-21: qty 10

## 2015-03-21 MED ORDER — SODIUM CHLORIDE 0.9 % IV SOLN: INTRAVENOUS | Status: DC

## 2015-03-21 MED ORDER — FAMOTIDINE IN NACL 20-0.9 MG/50ML-% IV SOLN
INTRAVENOUS | Status: AC
  Filled 2015-03-21: qty 50

## 2015-03-21 MED ORDER — METHYLPREDNISOLONE SODIUM SUCC 40 MG IJ SOLR
INTRAMUSCULAR | Status: AC
  Filled 2015-03-21: qty 1

## 2015-03-21 NOTE — Progress Notes (Signed)
Dr. [name] notified of pt.'s increased BP.  Order received to discontinue additional NS IVF orders to run at 150 ml/hr.

## 2015-03-21 NOTE — Patient Instructions (Signed)
[facility] Discharge Instructions for Patients Receiving Chemotherapy/Biotherapy  Today you received the following biotheraputic agent called rituxan. Patient information about this drug islisted at the end.  To help prevent nausea and vomiting after your treatment, we encourage you to take your nausea medication as prescribed.   If you develop nausea and vomiting that is not controlled by your nausea medication, call the clinic.   BELOW ARE SYMPTOMS THAT SHOULD BE REPORTED IMMEDIATELY:  *FEVER GREATER THAN 100.5 F  *CHILLS WITH OR WITHOUT FEVER  NAUSEA AND VOMITING THAT IS NOT CONTROLLED WITH YOUR NAUSEA MEDICATION  *UNUSUAL SHORTNESS OF BREATH  *UNUSUAL BRUISING OR BLEEDING  TENDERNESS IN MOUTH AND THROAT WITH OR WITHOUT PRESENCE OF ULCERS  *URINARY PROBLEMS  *BOWEL PROBLEMS  UNUSUAL RASH Items with * indicate a potential emergency and should be followed up as soon as possible.  Feel free to call the clinic you have any questions or concerns. The clinic phone number is [phone].  Please show the [facility] at check-in to the Emergency Department and triage nurse.  Rituximab injection What is this medicine? RITUXIMAB (ri TUX i mab) is a monoclonal antibody. This medicine changes the way the body's immune system works. It is used commonly to treat non-Hodgkin's lymphoma and other conditions. In cancer cells, this drug targets a specific protein within cancer cells and stops the cancer cells from growing. It is also used to treat rhuematoid arthritis (RA). In RA, this medicine slow the inflammatory process and help reduce joint pain and swelling. This medicine is often used with other cancer or arthritis medications. This medicine may be used for other purposes; ask your health care provider or pharmacist if you have questions. COMMON BRAND NAME(S): Rituxan What should I tell my health care provider before I take this medicine? They need to know if  you have any of these conditions: -blood disorders -heart disease -history of hepatitis B -infection (especially a virus infection such as chickenpox, cold sores, or herpes) -irregular heartbeat -kidney disease -lung or breathing disease, like asthma -lupus -an unusual or allergic reaction to rituximab, mouse proteins, other medicines, foods, dyes, or preservatives -pregnant or trying to get pregnant -breast-feeding How should I use this medicine? This medicine is for infusion into a vein. It is administered in a hospital or clinic by a specially trained health care professional. A special MedGuide will be given to you by the pharmacist with each prescription and refill. Be sure to read this information carefully each time. Talk to your pediatrician regarding the use of this medicine in children. This medicine is not approved for use in children. Overdosage: If you think you have taken too much of this medicine contact a poison control center or emergency room at once. NOTE: This medicine is only for you. Do not share this medicine with others. What if I miss a dose? It is important not to miss a dose. Call your doctor or health care professional if you are unable to keep an appointment. What may interact with this medicine? -cisplatin -medicines for blood pressure -some other medicines for arthritis -vaccines This list may not describe all possible interactions. Give your health care provider a list of all the medicines, herbs, non-prescription drugs, or dietary supplements you use. Also tell them if you smoke, drink alcohol, or use illegal drugs. Some items may interact with your medicine. What should I watch for while using this medicine? Report any side effects that you notice during your treatment right  away, such as changes in your breathing, fever, chills, dizziness or lightheadedness. These effects are more common with the first dose. Visit your prescriber or health care  professional for checks on your progress. You will need to have regular blood work. Report any other side effects. The side effects of this medicine can continue after you finish your treatment. Continue your course of treatment even though you feel ill unless your doctor tells you to stop. Call your doctor or health care professional for advice if you get a fever, chills or sore throat, or other symptoms of a cold or flu. Do not treat yourself. This drug decreases your body's ability to fight infections. Try to avoid being around people who are sick. This medicine may increase your risk to bruise or bleed. Call your doctor or health care professional if you notice any unusual bleeding. Be careful brushing and flossing your teeth or using a toothpick because you may get an infection or bleed more easily. If you have any dental work done, tell your dentist you are receiving this medicine. Avoid taking products that contain aspirin, acetaminophen, ibuprofen, naproxen, or ketoprofen unless instructed by your doctor. These medicines may hide a fever. Do not become pregnant while taking this medicine. Women should inform their doctor if they wish to become pregnant or think they might be pregnant. There is a potential for serious side effects to an unborn child. Talk to your health care professional or pharmacist for more information. Do not breast-feed an infant while taking this medicine. What side effects may I notice from receiving this medicine? Side effects that you should report to your doctor or health care professional as soon as possible: -allergic reactions like skin rash, itching or hives, swelling of the face, lips, or tongue -low blood counts - this medicine may decrease the number of white blood cells, red blood cells and platelets. You may be at increased risk for infections and bleeding. -signs of infection - fever or chills, cough, sore throat, pain or difficulty passing urine -signs of  decreased platelets or bleeding - bruising, pinpoint red spots on the skin, black, tarry stools, blood in the urine -signs of decreased red blood cells - unusually weak or tired, fainting spells, lightheadedness -breathing problems -confused, not responsive -chest pain -fast, irregular heartbeat -feeling faint or lightheaded, falls -mouth sores -redness, blistering, peeling or loosening of the skin, including inside the mouth -stomach pain -swelling of the ankles, feet, or hands -trouble passing urine or change in the amount of urine Side effects that usually do not require medical attention (report to your doctor or other health care professional if they continue or are bothersome): -anxiety -headache -loss of appetite -muscle aches -nausea -night sweats This list may not describe all possible side effects. Call your doctor for medical advice about side effects. You may report side effects to FDA at 1-800-FDA-1088. Where should I keep my medicine? This drug is given in a hospital or clinic and will not be stored at home. NOTE: This sheet is a summary. It may not cover all possible information. If you have questions about this medicine, talk to your doctor, pharmacist, or health care provider.  2015, Elsevier/Gold Standard. (2008-07-09 14:04:59)

## 2015-03-28 ENCOUNTER — Ambulatory Visit (HOSPITAL_BASED_OUTPATIENT_CLINIC_OR_DEPARTMENT_OTHER): Payer: Medicare Other

## 2015-03-28 ENCOUNTER — Ambulatory Visit: Payer: Medicare Other

## 2015-03-28 ENCOUNTER — Other Ambulatory Visit: Payer: Self-pay | Admitting: Oncology

## 2015-03-28 VITALS — BP 132/46 | HR 78 | Temp 98.2°F | Resp 18

## 2015-03-28 DIAGNOSIS — D631 Anemia in chronic kidney disease: Secondary | ICD-10-CM

## 2015-03-28 DIAGNOSIS — N183 Chronic kidney disease, stage 3 unspecified: Secondary | ICD-10-CM

## 2015-03-28 DIAGNOSIS — M19019 Primary osteoarthritis, unspecified shoulder: Secondary | ICD-10-CM

## 2015-03-28 DIAGNOSIS — C9112 Chronic lymphocytic leukemia of B-cell type in relapse: Secondary | ICD-10-CM | POA: Diagnosis present

## 2015-03-28 DIAGNOSIS — N189 Chronic kidney disease, unspecified: Secondary | ICD-10-CM

## 2015-03-28 DIAGNOSIS — E119 Type 2 diabetes mellitus without complications: Secondary | ICD-10-CM

## 2015-03-28 DIAGNOSIS — D638 Anemia in other chronic diseases classified elsewhere: Secondary | ICD-10-CM

## 2015-03-28 DIAGNOSIS — Z5112 Encounter for antineoplastic immunotherapy: Secondary | ICD-10-CM

## 2015-03-28 DIAGNOSIS — C9110 Chronic lymphocytic leukemia of B-cell type not having achieved remission: Secondary | ICD-10-CM

## 2015-03-28 LAB — CBC & DIFF AND RETIC
BASO%: 0.2 % (ref 0.0–2.0)
BASOS ABS: 0 10*3/uL (ref 0.0–0.1)
EOS ABS: 0.2 10*3/uL (ref 0.0–0.5)
EOS%: 2 % (ref 0.0–7.0)
HEMATOCRIT: 32.2 % — AB (ref 34.8–46.6)
HEMOGLOBIN: 10 g/dL — AB (ref 11.6–15.9)
Immature Retic Fract: 10.2 % — ABNORMAL HIGH (ref 1.60–10.00)
LYMPH%: 36.3 % (ref 14.0–49.7)
MCH: 28.2 pg (ref 25.1–34.0)
MCHC: 31.1 g/dL — ABNORMAL LOW (ref 31.5–36.0)
MCV: 90.7 fL (ref 79.5–101.0)
MONO#: 0.7 10*3/uL (ref 0.1–0.9)
MONO%: 7.3 % (ref 0.0–14.0)
NEUT%: 54.2 % (ref 38.4–76.8)
NEUTROS ABS: 5 10*3/uL (ref 1.5–6.5)
Platelets: 282 10*3/uL (ref 145–400)
RBC: 3.55 10*6/uL — AB (ref 3.70–5.45)
RDW: 15.6 % — ABNORMAL HIGH (ref 11.2–14.5)
RETIC %: 2.32 % — AB (ref 0.70–2.10)
RETIC CT ABS: 82.36 10*3/uL (ref 33.70–90.70)
WBC: 9.2 10*3/uL (ref 3.9–10.3)
lymph#: 3.3 10*3/uL (ref 0.9–3.3)

## 2015-03-28 LAB — FERRITIN CHCC: FERRITIN: 62 ng/mL (ref 9–269)

## 2015-03-28 MED ORDER — SODIUM CHLORIDE 0.9 % IV SOLN
INTRAVENOUS | Status: DC
  Administered 2015-03-28: 12:00:00 via INTRAVENOUS

## 2015-03-28 MED ORDER — FAMOTIDINE IN NACL 20-0.9 MG/50ML-% IV SOLN
20.0000 mg | Freq: Two times a day (BID) | INTRAVENOUS | Status: DC
  Administered 2015-03-28: 20 mg via INTRAVENOUS

## 2015-03-28 MED ORDER — SODIUM CHLORIDE 0.9 % IV SOLN
510.0000 mg | Freq: Once | INTRAVENOUS | Status: AC
  Administered 2015-03-28: 510 mg via INTRAVENOUS
  Filled 2015-03-28: qty 17

## 2015-03-28 MED ORDER — SODIUM CHLORIDE 0.9 % IV SOLN
Freq: Once | INTRAVENOUS | Status: AC
  Administered 2015-03-28: 12:00:00 via INTRAVENOUS

## 2015-03-28 MED ORDER — ACETAMINOPHEN 325 MG PO TABS
650.0000 mg | ORAL_TABLET | Freq: Once | ORAL | Status: AC
  Administered 2015-03-28: 650 mg via ORAL

## 2015-03-28 MED ORDER — METHYLPREDNISOLONE SODIUM SUCC 125 MG IJ SOLR
125.0000 mg | Freq: Every day | INTRAMUSCULAR | Status: DC
  Administered 2015-03-28: 125 mg via INTRAVENOUS

## 2015-03-28 MED ORDER — DIPHENHYDRAMINE HCL 25 MG PO CAPS
ORAL_CAPSULE | ORAL | Status: AC
  Filled 2015-03-28: qty 1

## 2015-03-28 MED ORDER — ACETAMINOPHEN 325 MG PO TABS
ORAL_TABLET | ORAL | Status: AC
  Filled 2015-03-28: qty 2

## 2015-03-28 MED ORDER — FAMOTIDINE IN NACL 20-0.9 MG/50ML-% IV SOLN
INTRAVENOUS | Status: AC
Start: 2015-03-28 — End: 2015-03-28
  Filled 2015-03-28: qty 50

## 2015-03-28 MED ORDER — SODIUM CHLORIDE 0.9 % IV SOLN
100.0000 mg | Freq: Once | INTRAVENOUS | Status: AC
  Administered 2015-03-28: 100 mg via INTRAVENOUS
  Filled 2015-03-28: qty 10

## 2015-03-28 MED ORDER — DARBEPOETIN ALFA 200 MCG/0.4ML IJ SOSY
200.0000 ug | PREFILLED_SYRINGE | Freq: Once | INTRAMUSCULAR | Status: AC
  Administered 2015-03-28: 200 ug via SUBCUTANEOUS
  Filled 2015-03-28: qty 0.4

## 2015-03-28 MED ORDER — METHYLPREDNISOLONE SODIUM SUCC 125 MG IJ SOLR
INTRAMUSCULAR | Status: AC
  Filled 2015-03-28: qty 2

## 2015-03-28 MED ORDER — DIPHENHYDRAMINE HCL 25 MG PO CAPS
25.0000 mg | ORAL_CAPSULE | Freq: Once | ORAL | Status: AC
  Administered 2015-03-28: 25 mg via ORAL

## 2015-03-28 NOTE — Progress Notes (Signed)
OK to give Aranesp today with HGB of 10.  No need for NS to run at 150 ml/hr per Dr. [name].

## 2015-03-28 NOTE — Patient Instructions (Addendum)
Ferumoxytol injection What is this medicine? FERUMOXYTOL is an iron complex. Iron is used to make healthy red blood cells, which carry oxygen and nutrients throughout the body. This medicine is used to treat iron deficiency anemia in people with chronic kidney disease. This medicine may be used for other purposes; ask your health care provider or pharmacist if you have questions. COMMON BRAND NAME(S): Feraheme What should I tell my health care provider before I take this medicine? They need to know if you have any of these conditions: -anemia not caused by low iron levels -high levels of iron in the blood -magnetic resonance imaging (MRI) test scheduled -an unusual or allergic reaction to iron, other medicines, foods, dyes, or preservatives -pregnant or trying to get pregnant -breast-feeding How should I use this medicine? This medicine is for injection into a vein. It is given by a health care professional in a hospital or clinic setting. Talk to your pediatrician regarding the use of this medicine in children. Special care may be needed. Overdosage: If you think you've taken too much of this medicine contact a poison control center or emergency room at once. Overdosage: If you think you have taken too much of this medicine contact a poison control center or emergency room at once. NOTE: This medicine is only for you. Do not share this medicine with others. What if I miss a dose? It is important not to miss your dose. Call your doctor or health care professional if you are unable to keep an appointment. What may interact with this medicine? This medicine may interact with the following medications: -other iron products This list may not describe all possible interactions. Give your health care provider a list of all the medicines, herbs, non-prescription drugs, or dietary supplements you use. Also tell them if you smoke, drink alcohol, or use illegal drugs. Some items may interact with  your medicine. What should I watch for while using this medicine? Visit your doctor or healthcare professional regularly. Tell your doctor or healthcare professional if your symptoms do not start to get better or if they get worse. You may need blood work done while you are taking this medicine. You may need to follow a special diet. Talk to your doctor. Foods that contain iron include: whole grains/cereals, dried fruits, beans, or peas, leafy green vegetables, and organ meats (liver, kidney). What side effects may I notice from receiving this medicine? Side effects that you should report to your doctor or health care professional as soon as possible: -allergic reactions like skin rash, itching or hives, swelling of the face, lips, or tongue -breathing problems -changes in blood pressure -feeling faint or lightheaded, falls -fever or chills -flushing, sweating, or hot feelings -swelling of the ankles or feet Side effects that usually do not require medical attention (Report these to your doctor or health care professional if they continue or are bothersome.): -diarrhea -headache -nausea, vomiting -stomach pain This list may not describe all possible side effects. Call your doctor for medical advice about side effects. You may report side effects to FDA at 1-800-FDA-1088. Where should I keep my medicine? This drug is given in a hospital or clinic and will not be stored at home. NOTE: This sheet is a summary. It may not cover all possible information. If you have questions about this medicine, talk to your doctor, pharmacist, or health care provider.  2015, Elsevier/Gold Standard. (2012-06-24 15:23:36) listed at the end.  To help prevent nausea and vomiting after your  treatment, we encourage you to take your nausea medication as prescribed.   If you develop nausea and vomiting that is not controlled by your nausea medication, call the clinic.   BELOW ARE SYMPTOMS THAT SHOULD BE REPORTED  IMMEDIATELY:  *FEVER GREATER THAN 100.5 F  *CHILLS WITH OR WITHOUT FEVER  NAUSEA AND VOMITING THAT IS NOT CONTROLLED WITH YOUR NAUSEA MEDICATION  *UNUSUAL SHORTNESS OF BREATH  *UNUSUAL BRUISING OR BLEEDING  TENDERNESS IN MOUTH AND THROAT WITH OR WITHOUT PRESENCE OF ULCERS  *URINARY PROBLEMS  *BOWEL PROBLEMS  UNUSUAL RASH Items with * indicate a potential emergency and should be followed up as soon as possible.  Feel free to call the clinic you have any questions or concerns. The clinic phone number is [phone].  Please show the [facility] at check-in to the Emergency Department and triage nurse.  Rituximab injection What is this medicine? RITUXIMAB (ri TUX i mab) is a monoclonal antibody. This medicine changes the way the body's immune system works. It is used commonly to treat non-Hodgkin's lymphoma and other conditions. In cancer cells, this drug targets a specific protein within cancer cells and stops the cancer cells from growing. It is also used to treat rhuematoid arthritis (RA). In RA, this medicine slow the inflammatory process and help reduce joint pain and swelling. This medicine is often used with other cancer or arthritis medications. This medicine may be used for other purposes; ask your health care provider or pharmacist if you have questions. COMMON BRAND NAME(S): Rituxan What should I tell my health care provider before I take this medicine? They need to know if you have any of these conditions: -blood disorders -heart disease -history of hepatitis B -infection (especially a virus infection such as chickenpox, cold sores, or herpes) -irregular heartbeat -kidney disease -lung or breathing disease, like asthma -lupus -an unusual or allergic reaction to rituximab, mouse proteins, other medicines, foods, dyes, or preservatives -pregnant or trying to get pregnant -breast-feeding How should I use this medicine? This medicine is for infusion into a  vein. It is administered in a hospital or clinic by a specially trained health care professional. A special MedGuide will be given to you by the pharmacist with each prescription and refill. Be sure to read this information carefully each time. Talk to your pediatrician regarding the use of this medicine in children. This medicine is not approved for use in children. Overdosage: If you think you have taken too much of this medicine contact a poison control center or emergency room at once. NOTE: This medicine is only for you. Do not share this medicine with others. What if I miss a dose? It is important not to miss a dose. Call your doctor or health care professional if you are unable to keep an appointment. What may interact with this medicine? -cisplatin -medicines for blood pressure -some other medicines for arthritis -vaccines This list may not describe all possible interactions. Give your health care provider a list of all the medicines, herbs, non-prescription drugs, or dietary supplements you use. Also tell them if you smoke, drink alcohol, or use illegal drugs. Some items may interact with your medicine. What should I watch for while using this medicine? Report any side effects that you notice during your treatment right away, such as changes in your breathing, fever, chills, dizziness or lightheadedness. These effects are more common with the first dose. Visit your prescriber or health care professional for checks on your progress. You will need to  have regular blood work. Report any other side effects. The side effects of this medicine can continue after you finish your treatment. Continue your course of treatment even though you feel ill unless your doctor tells you to stop. Call your doctor or health care professional for advice if you get a fever, chills or sore throat, or other symptoms of a cold or flu. Do not treat yourself. This drug decreases your body's ability to fight infections.  Try to avoid being around people who are sick. This medicine may increase your risk to bruise or bleed. Call your doctor or health care professional if you notice any unusual bleeding. Be careful brushing and flossing your teeth or using a toothpick because you may get an infection or bleed more easily. If you have any dental work done, tell your dentist you are receiving this medicine. Avoid taking products that contain aspirin, acetaminophen, ibuprofen, naproxen, or ketoprofen unless instructed by your doctor. These medicines may hide a fever. Do not become pregnant while taking this medicine. Women should inform their doctor if they wish to become pregnant or think they might be pregnant. There is a potential for serious side effects to an unborn child. Talk to your health care professional or pharmacist for more information. Do not breast-feed an infant while taking this medicine. What side effects may I notice from receiving this medicine? Side effects that you should report to your doctor or health care professional as soon as possible: -allergic reactions like skin rash, itching or hives, swelling of the face, lips, or tongue -low blood counts - this medicine may decrease the number of white blood cells, red blood cells and platelets. You may be at increased risk for infections and bleeding. -signs of infection - fever or chills, cough, sore throat, pain or difficulty passing urine -signs of decreased platelets or bleeding - bruising, pinpoint red spots on the skin, black, tarry stools, blood in the urine -signs of decreased red blood cells - unusually weak or tired, fainting spells, lightheadedness -breathing problems -confused, not responsive -chest pain -fast, irregular heartbeat -feeling faint or lightheaded, falls -mouth sores -redness, blistering, peeling or loosening of the skin, including inside the mouth -stomach pain -swelling of the ankles, feet, or hands -trouble passing urine  or change in the amount of urine Side effects that usually do not require medical attention (report to your doctor or other health care professional if they continue or are bothersome): -anxiety -headache -loss of appetite -muscle aches -nausea -night sweats This list may not describe all possible side effects. Call your doctor for medical advice about side effects. You may report side effects to FDA at 1-800-FDA-1088. Where should I keep my medicine? This drug is given in a hospital or clinic and will not be stored at home. NOTE: This sheet is a summary. It may not cover all possible information. If you have questions about this medicine, talk to your doctor, pharmacist, or health care provider.  2015, Elsevier/Gold Standard. (2008-07-09 14:04:59)

## 2015-04-04 ENCOUNTER — Other Ambulatory Visit (HOSPITAL_BASED_OUTPATIENT_CLINIC_OR_DEPARTMENT_OTHER): Payer: Medicare Other

## 2015-04-04 ENCOUNTER — Encounter: Payer: Self-pay | Admitting: *Deleted

## 2015-04-04 ENCOUNTER — Ambulatory Visit (HOSPITAL_BASED_OUTPATIENT_CLINIC_OR_DEPARTMENT_OTHER): Payer: Medicare Other

## 2015-04-04 ENCOUNTER — Ambulatory Visit: Payer: Medicare Other

## 2015-04-04 ENCOUNTER — Other Ambulatory Visit: Payer: Self-pay | Admitting: Oncology

## 2015-04-04 ENCOUNTER — Telehealth: Payer: Self-pay | Admitting: Oncology

## 2015-04-04 VITALS — BP 174/53 | HR 87 | Temp 97.9°F | Resp 20

## 2015-04-04 DIAGNOSIS — Z5112 Encounter for antineoplastic immunotherapy: Secondary | ICD-10-CM | POA: Diagnosis present

## 2015-04-04 DIAGNOSIS — C9110 Chronic lymphocytic leukemia of B-cell type not having achieved remission: Secondary | ICD-10-CM | POA: Diagnosis present

## 2015-04-04 DIAGNOSIS — M19019 Primary osteoarthritis, unspecified shoulder: Secondary | ICD-10-CM

## 2015-04-04 DIAGNOSIS — D638 Anemia in other chronic diseases classified elsewhere: Secondary | ICD-10-CM | POA: Diagnosis not present

## 2015-04-04 DIAGNOSIS — N183 Chronic kidney disease, stage 3 unspecified: Secondary | ICD-10-CM

## 2015-04-04 DIAGNOSIS — E119 Type 2 diabetes mellitus without complications: Secondary | ICD-10-CM

## 2015-04-04 LAB — CBC & DIFF AND RETIC
BASO%: 0.3 % (ref 0.0–2.0)
Basophils Absolute: 0 10*3/uL (ref 0.0–0.1)
EOS%: 1.5 % (ref 0.0–7.0)
Eosinophils Absolute: 0.2 10*3/uL (ref 0.0–0.5)
HCT: 36 % (ref 34.8–46.6)
HEMOGLOBIN: 11.2 g/dL — AB (ref 11.6–15.9)
IMMATURE RETIC FRACT: 26.2 % — AB (ref 1.60–10.00)
LYMPH%: 37.7 % (ref 14.0–49.7)
MCH: 29 pg (ref 25.1–34.0)
MCHC: 31.1 g/dL — ABNORMAL LOW (ref 31.5–36.0)
MCV: 93.3 fL (ref 79.5–101.0)
MONO#: 0.9 10*3/uL (ref 0.1–0.9)
MONO%: 6.5 % (ref 0.0–14.0)
NEUT#: 7.4 10*3/uL — ABNORMAL HIGH (ref 1.5–6.5)
NEUT%: 54 % (ref 38.4–76.8)
Platelets: 301 10*3/uL (ref 145–400)
RBC: 3.86 10*6/uL (ref 3.70–5.45)
RDW: 17.8 % — AB (ref 11.2–14.5)
RETIC %: 5.54 % — AB (ref 0.70–2.10)
Retic Ct Abs: 213.84 10*3/uL — ABNORMAL HIGH (ref 33.70–90.70)
WBC: 13.6 10*3/uL — AB (ref 3.9–10.3)
lymph#: 5.1 10*3/uL — ABNORMAL HIGH (ref 0.9–3.3)

## 2015-04-04 LAB — FERRITIN CHCC: Ferritin: 704 ng/ml — ABNORMAL HIGH (ref 9–269)

## 2015-04-04 MED ORDER — SODIUM CHLORIDE 0.9 % IV SOLN
100.0000 mg | Freq: Once | INTRAVENOUS | Status: AC
  Administered 2015-04-04: 100 mg via INTRAVENOUS
  Filled 2015-04-04: qty 10

## 2015-04-04 MED ORDER — FAMOTIDINE IN NACL 20-0.9 MG/50ML-% IV SOLN
INTRAVENOUS | Status: AC
  Filled 2015-04-04: qty 50

## 2015-04-04 MED ORDER — SODIUM CHLORIDE 0.9 % IV SOLN
INTRAVENOUS | Status: DC
  Administered 2015-04-04: 12:00:00 via INTRAVENOUS

## 2015-04-04 MED ORDER — DIPHENHYDRAMINE HCL 25 MG PO CAPS
ORAL_CAPSULE | ORAL | Status: AC
  Filled 2015-04-04: qty 1

## 2015-04-04 MED ORDER — FAMOTIDINE IN NACL 20-0.9 MG/50ML-% IV SOLN
20.0000 mg | Freq: Two times a day (BID) | INTRAVENOUS | Status: DC
  Administered 2015-04-04: 20 mg via INTRAVENOUS

## 2015-04-04 MED ORDER — METHYLPREDNISOLONE SODIUM SUCC 125 MG IJ SOLR
125.0000 mg | Freq: Every day | INTRAMUSCULAR | Status: DC
  Administered 2015-04-04: 125 mg via INTRAVENOUS

## 2015-04-04 MED ORDER — METHYLPREDNISOLONE SODIUM SUCC 125 MG IJ SOLR
INTRAMUSCULAR | Status: AC
  Filled 2015-04-04: qty 2

## 2015-04-04 MED ORDER — ACETAMINOPHEN 325 MG PO TABS
ORAL_TABLET | ORAL | Status: AC
  Filled 2015-04-04: qty 2

## 2015-04-04 MED ORDER — ACETAMINOPHEN 325 MG PO TABS
650.0000 mg | ORAL_TABLET | Freq: Once | ORAL | Status: AC
  Administered 2015-04-04: 650 mg via ORAL

## 2015-04-04 MED ORDER — DIPHENHYDRAMINE HCL 25 MG PO CAPS
25.0000 mg | ORAL_CAPSULE | Freq: Once | ORAL | Status: AC
  Administered 2015-04-04: 25 mg via ORAL

## 2015-04-04 NOTE — Progress Notes (Signed)
Pt heart rate fluctuates continuously between 40 and 120.  MD notified, advised to monitor.

## 2015-04-04 NOTE — Telephone Encounter (Signed)
Appointments made and avs printed for patient °

## 2015-04-04 NOTE — Patient Instructions (Signed)
[facility] Discharge Instructions for Patients Receiving Chemotherapy  Today you received the following chemotherapy agents: rituxan  To help prevent nausea and vomiting after your treatment, we encourage you to take your nausea medication.  Take it as often as prescribed.     If you develop nausea and vomiting that is not controlled by your nausea medication, call the clinic. If it is after clinic hours your family physician or the after hours number for the clinic or go to the Emergency Department.   BELOW ARE SYMPTOMS THAT SHOULD BE REPORTED IMMEDIATELY:  *FEVER GREATER THAN 100.5 F  *CHILLS WITH OR WITHOUT FEVER  NAUSEA AND VOMITING THAT IS NOT CONTROLLED WITH YOUR NAUSEA MEDICATION  *UNUSUAL SHORTNESS OF BREATH  *UNUSUAL BRUISING OR BLEEDING  TENDERNESS IN MOUTH AND THROAT WITH OR WITHOUT PRESENCE OF ULCERS  *URINARY PROBLEMS  *BOWEL PROBLEMS  UNUSUAL RASH Items with * indicate a potential emergency and should be followed up as soon as possible.  Feel free to call the clinic you have any questions or concerns. The clinic phone number is [phone].   I have been informed and understand all the instructions given to me. I know to contact the clinic, my physician, or go to the Emergency Department if any problems should occur. I do not have any questions at this time, but understand that I may call the clinic during office hours   should I have any questions or need assistance in obtaining follow up care.    __________________________________________  _____________  __________ Signature of Patient or Authorized Representative            Date                   Time    __________________________________________ Nurse's Signature

## 2015-04-04 NOTE — Progress Notes (Unsigned)
The nurse today was concerned that during infusion that Jlee's pulse was very irregular. She has a history of multiple PVCs and this is felt to be benign. If she does have atrial fibrillation however, I don't think she would be an anticoagulation candidate because of hot full risk. Instead she is on Aggrenox. Certainly if she had A. Fib I would not do anything different than she is already doing. She is entirely asymptomatic (they brought her by for me to review) and specifically denies shortness of breath, chest pain or pressure, or other cardiac symptoms.  She is doing remarkably better as far as her anemia is concerned now that she has some iron in her system and that the chronic lymphoid leukemia is better controlled.  At this point I'm going to stop her right toxin. She will see me again in June. At that time I expect we will resume every 3-6 month follow-up as before.

## 2015-04-05 ENCOUNTER — Encounter: Payer: Self-pay | Admitting: *Deleted

## 2015-04-09 LAB — HM DIABETES EYE EXAM

## 2015-04-11 ENCOUNTER — Ambulatory Visit: Payer: Medicare Other

## 2015-04-11 ENCOUNTER — Other Ambulatory Visit: Payer: Medicare Other

## 2015-04-18 ENCOUNTER — Other Ambulatory Visit: Payer: Medicare Other

## 2015-04-18 ENCOUNTER — Ambulatory Visit: Payer: Medicare Other

## 2015-04-18 ENCOUNTER — Ambulatory Visit: Payer: Medicare Other | Admitting: Oncology

## 2015-05-06 ENCOUNTER — Encounter: Payer: Medicare Other | Admitting: Internal Medicine

## 2015-05-16 ENCOUNTER — Ambulatory Visit (HOSPITAL_BASED_OUTPATIENT_CLINIC_OR_DEPARTMENT_OTHER): Payer: Medicare Other | Admitting: Oncology

## 2015-05-16 ENCOUNTER — Other Ambulatory Visit (HOSPITAL_BASED_OUTPATIENT_CLINIC_OR_DEPARTMENT_OTHER): Payer: Medicare Other

## 2015-05-16 ENCOUNTER — Telehealth: Payer: Self-pay | Admitting: Oncology

## 2015-05-16 VITALS — BP 178/64 | HR 82 | Temp 98.1°F | Resp 18 | Ht <= 58 in | Wt 137.8 lb

## 2015-05-16 DIAGNOSIS — N183 Chronic kidney disease, stage 3 unspecified: Secondary | ICD-10-CM

## 2015-05-16 DIAGNOSIS — D638 Anemia in other chronic diseases classified elsewhere: Secondary | ICD-10-CM

## 2015-05-16 DIAGNOSIS — N182 Chronic kidney disease, stage 2 (mild): Secondary | ICD-10-CM

## 2015-05-16 DIAGNOSIS — E119 Type 2 diabetes mellitus without complications: Secondary | ICD-10-CM

## 2015-05-16 DIAGNOSIS — E1122 Type 2 diabetes mellitus with diabetic chronic kidney disease: Secondary | ICD-10-CM

## 2015-05-16 DIAGNOSIS — D631 Anemia in chronic kidney disease: Secondary | ICD-10-CM

## 2015-05-16 DIAGNOSIS — C9110 Chronic lymphocytic leukemia of B-cell type not having achieved remission: Secondary | ICD-10-CM

## 2015-05-16 DIAGNOSIS — M19019 Primary osteoarthritis, unspecified shoulder: Secondary | ICD-10-CM

## 2015-05-16 DIAGNOSIS — N189 Chronic kidney disease, unspecified: Principal | ICD-10-CM

## 2015-05-16 DIAGNOSIS — M25561 Pain in right knee: Secondary | ICD-10-CM

## 2015-05-16 LAB — CBC & DIFF AND RETIC
BASO%: 0.4 % (ref 0.0–2.0)
Basophils Absolute: 0.1 10*3/uL (ref 0.0–0.1)
EOS ABS: 0.3 10*3/uL (ref 0.0–0.5)
EOS%: 1.9 % (ref 0.0–7.0)
HCT: 38.1 % (ref 34.8–46.6)
HEMOGLOBIN: 12 g/dL (ref 11.6–15.9)
Immature Retic Fract: 6.3 % (ref 1.60–10.00)
LYMPH%: 32 % (ref 14.0–49.7)
MCH: 29.1 pg (ref 25.1–34.0)
MCHC: 31.5 g/dL (ref 31.5–36.0)
MCV: 92.3 fL (ref 79.5–101.0)
MONO#: 3.2 10*3/uL — ABNORMAL HIGH (ref 0.1–0.9)
MONO%: 22.4 % — AB (ref 0.0–14.0)
NEUT#: 6.2 10*3/uL (ref 1.5–6.5)
NEUT%: 43.3 % (ref 38.4–76.8)
PLATELETS: 329 10*3/uL (ref 145–400)
RBC: 4.13 10*6/uL (ref 3.70–5.45)
RDW: 17.2 % — ABNORMAL HIGH (ref 11.2–14.5)
Retic %: 1.4 % (ref 0.70–2.10)
Retic Ct Abs: 57.82 10*3/uL (ref 33.70–90.70)
WBC: 14.2 10*3/uL — ABNORMAL HIGH (ref 3.9–10.3)
lymph#: 4.5 10*3/uL — ABNORMAL HIGH (ref 0.9–3.3)

## 2015-05-16 LAB — TECHNOLOGIST REVIEW

## 2015-05-16 LAB — FERRITIN CHCC: Ferritin: 200 ng/ml (ref 9–269)

## 2015-05-16 NOTE — Addendum Note (Signed)
Addended by: Prentiss Bells on: 05/16/2015 03:40 PM   Modules accepted: Medications

## 2015-05-16 NOTE — Progress Notes (Signed)
ID: [name]   DOB: 01/24/1921  MR#: [id number]  CSN#:[id number]  PCP: Tracy Pais, MD GYN: SU:  OTHER MD: [name]  CHIEF COMPLAINT: Chronic lymphoid leukemia, anemia of renal failure  CURRENT TREATMENT: Aranesp,  Low-dose prednisone, rituximab  INTERVAL HISTORY: [name] returns for follow up of her chronic lymphoid leukemia accompanied by her husband [name]. She tells me she is doing well, doing her housework, and the only complaint she has is her right knee. At home she uses a cane. She gets around the house without difficulty and there have been no falls.  REVIEW OF SYSTEMS: [name] tells me she has had no fevers, drenching sweats, unexplained fatigue or unexplained weight loss. In fact her weight today similar to her wait 2 years ago area I mention this because [name] is worried that she is not eating enough.she has pain in her right knee, and tells me that there is nothing that can be done for that right now. Certainly I think there would be some concern given her age of whether she could undergo surgery. A detailed review of systems today was otherwise entirely stable.   PAST MEDICAL HISTORY: Past Medical History  Diagnosis Date  . Diabetes mellitus   . Hyperlipidemia   . Hypertension   . Carotid stenosis, right 08/2005    40-60%  . History of colonic polyps 2002    Normal colonoscopy 08/2004  . CKD (chronic kidney disease) stage 2, GFR 60-89 ml/min   . CLL (chronic lymphocytic leukemia)     Dr. Jana Hampton. s/p Rituxan  . Pulse irregularity     Etiology->frequent PACs, normal EKG    FAMILY HISTORY No family history on file.  GYNECOLOGIC HISTORY: GX P0  SOCIAL HISTORY: She is originally from [place]. 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 They have no children. They are Catholic.  ADVANCED DIRECTIVES: in place  HEALTH MAINTENANCE: History  Substance Use Topics  . Smoking status: Former Research scientist (life sciences)  . Smokeless tobacco: Not on file  .  Alcohol Use: Not on file     Colonoscopy: [facility]  PAP:  Bone density:  Lipid panel:  No Known Allergies  Current Outpatient Prescriptions  Medication Sig Dispense Refill  . acetaminophen (TYLENOL) 500 MG tablet Take 1,000 mg by mouth 2 (two) times daily as needed. For pain    . AGGRENOX 25-200 MG per 12 hr capsule TAKE ONE CAPSULE BY MOUTH TWICE A Hampton 200 capsule 2  . allopurinol (ZYLOPRIM) 100 MG tablet Take 1 tablet (100 mg total) by mouth daily. 90 tablet 3  . Calcium Carbonate-Vitamin D (CALCIUM PLUS VITAMIN D PO) Take 1 tablet by mouth 2 (two) times daily.    . furosemide (LASIX) 40 MG tablet TAKE 1 TABLET BY MOUTH EVERY Hampton 90 tablet 3  . losartan (COZAAR) 100 MG tablet TAKE 1 TABLET BY MOUTH EVERY Hampton 100 tablet 3  . Omega-3 Fatty Acids (FISH OIL) 1200 MG CAPS Take 1 capsule by mouth daily.    [name] omeprazole (PRILOSEC) 20 MG capsule Take 1 capsule (20 mg total) by mouth daily. 100 capsule 12  . Polyethyl Glycol-Propyl Glycol (SYSTANE OP) Place 1 drop into both eyes 2 (two) times daily.    . predniSONE (DELTASONE) 10 MG tablet Take one tablet (10 mg) daily with breakfast 30 tablet 6  . simvastatin (ZOCOR) 20 MG tablet TAKE 1 TABLET BY MOUTH AT BEDTIME 100 tablet 1  . sodium chloride (OCEAN NASAL SPRAY) 0.65 % nasal  spray Place 1 spray into the nose as needed for congestion (You may use it 2-3 times per Hampton.). (Patient not taking: Reported on 12/03/2014) 45 mL 3  . traMADol-acetaminophen (ULTRACET) 37.5-325 MG per tablet Take 1-2 tablets by mouth every 6 (six) hours as needed for moderate pain. 30 tablet 3   No current facility-administered medications for this visit.    OBJECTIVE: Elderly white woman in no acute distress Filed Vitals:   05/16/15 1214  BP: 178/64  Pulse: 82  Temp: 98.1 F (36.7 C)  Resp: 18     Body mass index is 32.03 kg/(m^2).    ECOG FS: 2  Sclerae unicteric, pupils round and equal Oropharynx clear and moist-- no thrush or other lesions No cervical or  supraclavicular adenopathy Lungs no rales or rhonchi Heart regular rate and rhythm Abd soft, nontender, positive bowel sounds MSK limited exam, patient in a wheelchair Neuro: nonfocal, well oriented, positive affect Breasts: Deferred     LAB RESULTS: Lab Results  Component Value Date   WBC 14.2* 05/16/2015   NEUTROABS 6.2 05/16/2015   HGB 12.0 05/16/2015   HCT 38.1 05/16/2015   MCV 92.3 05/16/2015   PLT 329 05/16/2015      Chemistry      Component Value Date/Time   NA 143 07/10/2014 1447   NA 136 08/21/2013 1429   K 4.3 07/10/2014 1447   K 4.6 08/21/2013 1429   CL 95* 08/21/2013 1429   CL 103 04/18/2013 1422   CO2 22 07/10/2014 1447   CO2 25 08/21/2013 1429   BUN 52.0* 07/10/2014 1447   BUN 47* 08/21/2013 1429   CREATININE 2.4* 07/10/2014 1447   CREATININE 2.06* 08/21/2013 1429   CREATININE 1.89* 06/28/2012 1425      Component Value Date/Time   CALCIUM 9.4 07/10/2014 1447   CALCIUM 9.3 08/21/2013 1429   CALCIUM 10.5 08/07/2008 0000   ALKPHOS 100 07/10/2014 1447   ALKPHOS 77 06/28/2012 1425   AST 21 07/10/2014 1447   AST 15 06/28/2012 1425   ALT 10 07/10/2014 1447   ALT 10 06/28/2012 1425   BILITOT 0.27 07/10/2014 1447   BILITOT 0.4 06/28/2012 1425       No results found for: LABCA2  No components found for: [id number]  No results for input(s): INR in the last 168 hours.  Urinalysis No results found for: COLORURINE Results for Tracy, Hampton (MRN [id number]) as of 05/16/2015 12:33  Ref. Range 02/21/2015 09:00 02/28/2015 10:45 03/07/2015 09:41 03/14/2015 09:11 03/20/2015 09:42 03/28/2015 11:19 04/04/2015 11:26 05/16/2015 11:59  lymph# Latest Ref Range: 0.9-3.3 10e3/uL 109.0 (H) 109.5 (H) 40.3 (H) 6.4 (H) 4.3 (H) 3.3 5.1 (H) 4.5 (H)   STUDIES: No results found.   ASSESSMENT: 81 y.o.  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  Status  post treatment with Rituxan in 2004 and 2007, with no treatment after November 2007.   (2) anemia, possibly due to CLL progression but certainly worsened by chronic renal insufficiency     (a) aranesp started 02/07/2015, to be repeated every 28 days  (b) feraheme to be given whenever ferritin falls to <100  (3) rituximab started 02/14/2015, repeated weekly x7 at lod dose with excellent response  (a) allopurinol started 02/07/2015  (b) hepatitis serologies drawn 02/11/2015 negative  (c) rituximab maintenance to start 06/03/2015, to be repeated every 8 weeks  PLAN:  [name] tolerated the rituximab infusions well after  the initial reaction. Her lymphocyte count dropped from 109K currently 4.5. More importantly, her hemoglobin normalized, currently at 12.0.  At this point we are going to start checking her CBC on a monthly basis. She will receive Aranesp as appropriate and we will replenish the iron if the ferritin drops below 100. We will start rituximab maintenance in July and repeated every 2 months for the next 2 years.  Dear as a good understanding of this plan. She agrees with it. She knows a goal of treatment in her case is control. She will call with any problems that may develop before next visit here.  Leamon Palau C    05/16/2015

## 2015-05-16 NOTE — Telephone Encounter (Signed)
Gave avs & calendar for July through October

## 2015-05-28 ENCOUNTER — Other Ambulatory Visit: Payer: Self-pay | Admitting: Internal Medicine

## 2015-06-06 ENCOUNTER — Telehealth: Payer: Self-pay | Admitting: Internal Medicine

## 2015-06-06 NOTE — Telephone Encounter (Addendum)
Call to patient to confirm appointment for 06/07/15 at 2:15 busy signal

## 2015-06-07 ENCOUNTER — Ambulatory Visit (INDEPENDENT_AMBULATORY_CARE_PROVIDER_SITE_OTHER): Payer: Medicare Other | Admitting: Internal Medicine

## 2015-06-07 ENCOUNTER — Encounter: Payer: Self-pay | Admitting: Internal Medicine

## 2015-06-07 VITALS — BP 182/110 | HR 76 | Temp 98.0°F | Ht <= 58 in

## 2015-06-07 DIAGNOSIS — E785 Hyperlipidemia, unspecified: Secondary | ICD-10-CM

## 2015-06-07 DIAGNOSIS — E559 Vitamin D deficiency, unspecified: Secondary | ICD-10-CM

## 2015-06-07 DIAGNOSIS — Z0000 Encounter for general adult medical examination without abnormal findings: Secondary | ICD-10-CM

## 2015-06-07 DIAGNOSIS — E669 Obesity, unspecified: Secondary | ICD-10-CM

## 2015-06-07 DIAGNOSIS — I129 Hypertensive chronic kidney disease with stage 1 through stage 4 chronic kidney disease, or unspecified chronic kidney disease: Secondary | ICD-10-CM | POA: Diagnosis not present

## 2015-06-07 DIAGNOSIS — E1122 Type 2 diabetes mellitus with diabetic chronic kidney disease: Secondary | ICD-10-CM

## 2015-06-07 DIAGNOSIS — N183 Chronic kidney disease, stage 3 unspecified: Secondary | ICD-10-CM

## 2015-06-07 DIAGNOSIS — Z6832 Body mass index (BMI) 32.0-32.9, adult: Secondary | ICD-10-CM | POA: Diagnosis not present

## 2015-06-07 DIAGNOSIS — K219 Gastro-esophageal reflux disease without esophagitis: Secondary | ICD-10-CM

## 2015-06-07 DIAGNOSIS — I10 Essential (primary) hypertension: Secondary | ICD-10-CM

## 2015-06-07 LAB — LIPID PANEL
CHOL/HDL RATIO: 4.6 ratio
CHOLESTEROL: 190 mg/dL (ref 0–200)
HDL: 41 mg/dL — AB (ref >=46)
LDL Cholesterol: 126 mg/dL — ABNORMAL HIGH (ref 0–99)
Triglycerides: 117 mg/dL (ref <150)
VLDL: 23 mg/dL (ref 0–40)

## 2015-06-07 LAB — COMPLETE METABOLIC PANEL WITH GFR
ALBUMIN: 4.3 g/dL (ref 3.5–5.2)
ALT: 12 U/L (ref 0–35)
AST: 22 U/L (ref 0–37)
Alkaline Phosphatase: 91 U/L (ref 39–117)
BUN: 25 mg/dL — ABNORMAL HIGH (ref 6–23)
CO2: 30 mEq/L (ref 19–32)
Calcium: 9.2 mg/dL (ref 8.4–10.5)
Chloride: 105 mEq/L (ref 96–112)
Creat: 1.15 mg/dL — ABNORMAL HIGH (ref 0.50–1.10)
GFR, EST AFRICAN AMERICAN: 48 mL/min — AB
GFR, Est Non African American: 41 mL/min — ABNORMAL LOW
Glucose, Bld: 109 mg/dL — ABNORMAL HIGH (ref 70–99)
POTASSIUM: 4.5 meq/L (ref 3.5–5.3)
Sodium: 147 mEq/L — ABNORMAL HIGH (ref 135–145)
TOTAL PROTEIN: 6.6 g/dL (ref 6.0–8.3)
Total Bilirubin: 0.5 mg/dL (ref 0.2–1.2)

## 2015-06-07 LAB — GLUCOSE, CAPILLARY: GLUCOSE-CAPILLARY: 133 mg/dL — AB (ref 65–99)

## 2015-06-07 LAB — POCT GLYCOSYLATED HEMOGLOBIN (HGB A1C): Hemoglobin A1C: 6.6

## 2015-06-07 MED ORDER — SIMVASTATIN 20 MG PO TABS: 20.0000 mg | ORAL_TABLET | Freq: Every day | ORAL | Status: DC

## 2015-06-07 MED ORDER — LOSARTAN POTASSIUM 100 MG PO TABS: 100.0000 mg | ORAL_TABLET | Freq: Every day | ORAL | Status: DC

## 2015-06-07 MED ORDER — OMEPRAZOLE 20 MG PO CPDR: 20.0000 mg | DELAYED_RELEASE_CAPSULE | Freq: Every day | ORAL | Status: DC

## 2015-06-07 MED ORDER — FUROSEMIDE 40 MG PO TABS: 40.0000 mg | ORAL_TABLET | Freq: Every day | ORAL | Status: DC

## 2015-06-07 NOTE — Assessment & Plan Note (Signed)
Assessment: Pt with CKD Stage 3 with fluctuating baseline Cr (2.4 last on 8/1/815) who presents with normal urine output.   Plan:  -Obtain urine microalbumin test  -Obtain CMP, phosphorus, PTH, and 25-OH vitamin D   -Pt receives aranesp started 02/07/2015 every 28 days in setting of chronic normocytic anemia by oncology  -Continue losartan 100 mg daily  -Avoid nephrotoxins

## 2015-06-07 NOTE — Patient Instructions (Addendum)
-  Great job on your diabetes -- your A1c is 6.6 which is at goal of less than 7.  -Start taking lasix 40 mg daily for your blood pressure in addition to losartan 100 mg daily, I have refilled your medications -Will check your bloodwork today and call you with the results -Please come back in 1 month to recheck your blood pressure, very nice meeting you!  General Instructions:   Please bring your medicines with you each time you come to clinic.  Medicines may include prescription medications, over-the-counter medications, herbal remedies, eye drops, vitamins, or other pills.   Progress Toward Treatment Goals:  Treatment Goal 02/11/2015  Hemoglobin A1C at goal  Blood pressure unchanged    Self Care Goals & Plans:  Self Care Goal 06/07/2015  Manage my medications take my medicines as prescribed; bring my medications to every visit  Monitor my health -  Eat healthy foods -  Be physically active find an activity I enjoy  Meeting treatment goals -    Home Blood Glucose Monitoring 02/11/2015  Check my blood sugar no home glucose monitoring     Care Management & Community Referrals:  Referral 10/08/2014  Referrals made for care management support other (see comment)  Referrals made to community resources -

## 2015-06-07 NOTE — Progress Notes (Signed)
Internal Medicine Clinic Attending  Case discussed with Dr. Saraiya at the time of the visit.  We reviewed the resident's history and exam and pertinent patient test results.  I agree with the assessment, diagnosis, and plan of care documented in the resident's note.  

## 2015-06-07 NOTE — Assessment & Plan Note (Addendum)
Assessment: Pt with uncontrolled hypertension with unclear compliance with two-class (ARB & diuretic) anti-hypertensive therapy who presents with asymptomatic blood pressure of 182/110 in setting of corticosteroid use.  Plan:  -BP 182/110 not at goal <140/90 in setting of corticosteroid use and unclear compliance with medications -Refill lasix 40 mg daily and losartan 100 mg daily, consider starting amlodipine at next visit if blood pressure continues to be uncontrolled -Obtain CMP -Pt to return in 1 month for blood pressure recheck

## 2015-06-07 NOTE — Assessment & Plan Note (Addendum)
Assessment: Pt with last lipid panel on 06/19/13 with LDL 99 compliant with moderate-intensity statin therapy with no calculated 10-yr ASCVD risk due to age >98.    Plan:  -Obtain annual lipid panel  -Continue simvastatin 20 mg daily in setting of DM and CVA -Obtain CMP -Continue to monitor for myalgias   ADDENDUM on 06/09/15: LDL 126 not at goal <100, will increase simvastatin form 20 mg daily to 40 mg daily.

## 2015-06-07 NOTE — Assessment & Plan Note (Signed)
-  Obtain screening HV Ab -DEXA scan not indicated in setting of CKD, will defer testing -Inquire about zoster vaccination at next visit

## 2015-06-07 NOTE — Progress Notes (Signed)
Patient ID: [name], female   DOB: [birth year], 81 y.o.   MRN: [id number]    Subjective:   Patient ID: [name] female   DOB: 13-Jul-1921 81 y.o.   MRN: [id number]  HPI: [name] is a 81 y.o. pleasant woman with past medical history of CLL on rituximab and prednisone, non-insulin dependent Type 2 DM, CKD Stage 3, hypertension, hyperlipidemia, right caratoid stenosis, chronic normocytic anemia on aranesp, osteoarthritis, and GERD who presents for routine follow-up.   Her last A1c was 6.6 on 02/11/15. She is not on oral hypoglycemic therapy. She does not check her blood sugar at home. She has occasional blurry vision but denies symptomatic hypoglycemia, polydipsia, polyuria, polyphagia, neuropathy, or foot injury/ulceration. She follows a healthy diet but unfortunately unable to exercise much. Her weight is stable since last visit 4 months ago. Her last eye exam was normal May of 2015.   She reports compliance with taking losartan but unclear if is taking lasix. She has occasional headache and chronic LE edema but denies chest pain or lightheadedness. She is on prednisone 10 mg daily for CLL which was started on 02/21/15.  She reports compliance with taking zocor for hyperlipidemia. She denies muscle cramping.   She reports compliance with taking prilosec for GERD. She denies alarm symptoms.    Past Medical History  Diagnosis Date  . Diabetes mellitus   . Hyperlipidemia   . Hypertension   . Carotid stenosis, right 08/2005    40-60%  . History of colonic polyps 2002    Normal colonoscopy 08/2004  . CKD (chronic kidney disease) stage 2, GFR 60-89 ml/min   . CLL (chronic lymphocytic leukemia)     Dr. Jana Hampton. s/p Rituxan  . Pulse irregularity     Etiology->frequent PACs, normal EKG   Current Outpatient Prescriptions  Medication Sig Dispense Refill  . acetaminophen (TYLENOL) 500 MG tablet Take 1,000 mg by mouth 2 (two) times daily as needed. For pain    . allopurinol  (ZYLOPRIM) 100 MG tablet Take 1 tablet (100 mg total) by mouth daily. 90 tablet 3  . Calcium Carbonate-Vitamin D (CALCIUM PLUS VITAMIN D PO) Take 1 tablet by mouth 2 (two) times daily.    [name] dipyridamole-aspirin (AGGRENOX) 200-25 MG per 12 hr capsule TAKE ONE CAPSULE BY MOUTH TWICE A DAY 200 capsule 2  . furosemide (LASIX) 40 MG tablet TAKE 1 TABLET BY MOUTH EVERY DAY 90 tablet 3  . losartan (COZAAR) 100 MG tablet TAKE 1 TABLET BY MOUTH EVERY DAY 100 tablet 3  . Omega-3 Fatty Acids (FISH OIL) 1200 MG CAPS Take 1 capsule by mouth daily.    [name] omeprazole (PRILOSEC) 20 MG capsule Take 1 capsule (20 mg total) by mouth daily. 100 capsule 12  . Polyethyl Glycol-Propyl Glycol (SYSTANE OP) Place 1 drop into both eyes 2 (two) times daily.    . predniSONE (DELTASONE) 10 MG tablet Take one tablet (10 mg) daily with breakfast 30 tablet 6  . simvastatin (ZOCOR) 20 MG tablet TAKE 1 TABLET BY MOUTH AT BEDTIME 100 tablet 1  . traMADol-acetaminophen (ULTRACET) 37.5-325 MG per tablet Take 1-2 tablets by mouth every 6 (six) hours as needed for moderate pain. 30 tablet 3   No current facility-administered medications for this visit.   No family history on file. History   Social History  . Marital Status: Married    Spouse Name: N/A  . Number of Children: N/A  . Years of Education: N/A   Social History  Main Topics  . Smoking status: Former Research scientist (life sciences)  . Smokeless tobacco: Not on file  . Alcohol Use: Not on file  . Drug Use: Not on file  . Sexual Activity: Not on file   Other Topics Concern  . Not on file   Social History Narrative   Review of Systems: Review of Systems  Constitutional: Positive for malaise/fatigue.  Eyes: Positive for blurred vision.  Respiratory: Negative for cough, shortness of breath and wheezing.   Cardiovascular: Positive for leg swelling (chronic b/l LE). Negative for chest pain.  Gastrointestinal: Positive for heartburn (controlled on PPI) and diarrhea (occasionally loose  stools). Negative for nausea, vomiting, abdominal pain, constipation, blood in stool and melena.  Genitourinary: Negative for dysuria, urgency, frequency and hematuria.  Musculoskeletal: Positive for joint pain (osteoarthritis of multiple joints). Negative for myalgias and falls.  Neurological: Positive for headaches (occasionally). Negative for dizziness.  Endo/Heme/Allergies: Negative for polydipsia.  Psychiatric/Behavioral: Positive for memory loss (normal aging).     Objective:  Physical Exam: Filed Vitals:   06/07/15 1449  BP: 182/110  Pulse: 76  Temp: 98 F (36.7 C)  TempSrc: Oral  Height: 4\' 7"  (1.397 m)  SpO2: 97%    Physical Exam  Constitutional: She is oriented to person, place, and time. She appears well-developed and well-nourished. No distress.  HENT:  Head: Normocephalic and atraumatic.  Right Ear: External ear normal.  Left Ear: External ear normal.  Nose: Nose normal.  Mouth/Throat: Oropharynx is clear and moist. No oropharyngeal exudate.  Eyes: Conjunctivae and EOM are normal. Pupils are equal, round, and reactive to light. Right eye exhibits no discharge. Left eye exhibits no discharge. No scleral icterus.  Neck: Normal range of motion. Neck supple.  Cardiovascular: Normal rate, regular rhythm and normal heart sounds.   Pulmonary/Chest: Effort normal and breath sounds normal. No respiratory distress. She has no wheezes. She has no rales.  Abdominal: Soft. Bowel sounds are normal. She exhibits no distension. There is no tenderness. There is no rebound and no guarding.  Musculoskeletal: Normal range of motion. She exhibits edema (trace b/l LE ) and tenderness (b/l LE).  Neurological: She is alert and oriented to person, place, and time.  Skin: Skin is warm and dry. No rash noted. She is not diaphoretic. No erythema. No pallor.  Psychiatric: She has a normal mood and affect. Her behavior is normal. Judgment and thought content normal.    Assessment & Plan:    Please see problem list for problem-based assessment and plan

## 2015-06-07 NOTE — Assessment & Plan Note (Signed)
Assessment: Pt with well-controlled GERD compliant with PPI therapy who presents with no alarm symptoms.  Plan:  -Refill omeprazole 20 mg daily  -Continue to monitor for alarm symptoms warranting EGD

## 2015-06-07 NOTE — Assessment & Plan Note (Addendum)
Assessment: Pt with last A1c of 6.6 on 02/11/15 not on medical therapy with no recent symptomatic hypoglycemia who presents with CBF of 133 and unchanged A1c of 6.6.  Plan:  -A1c 6.6 at goal <7, continue lifestyle modification  -BP 182/110 not at goal <140/90 in setting of corticosteroid use unclear compliance, refill lasix 40 mg daily and losartan 100 mg daily -Obtain annual lipid panel, last LDL 99 at goal <100, continue simvastatin 20 mg daily  -Obtain annual urine microalbumin test -Last annual foot exam on 07/02/14, repeat at next visit  -Last annual eye exam on 04/06/14 with no retinopathy, pt to schedule appointment with her opthalmologist   -BMI 32.03 not at goal <25, encourage weight loss

## 2015-06-08 LAB — MICROALBUMIN / CREATININE URINE RATIO
Creatinine, Urine: 152.1 mg/dL
MICROALB UR: 365.3 mg/dL — AB (ref <2.0)
Microalb Creat Ratio: 2401.7 mg/g — ABNORMAL HIGH (ref 0.0–30.0)

## 2015-06-08 LAB — VITAMIN D 25 HYDROXY (VIT D DEFICIENCY, FRACTURES): VIT D 25 HYDROXY: 26 ng/mL — AB (ref 30–100)

## 2015-06-08 LAB — PARATHYROID HORMONE, INTACT (NO CA): PTH: 78 pg/mL — ABNORMAL HIGH (ref 15–65)

## 2015-06-08 LAB — HIV ANTIBODY (ROUTINE TESTING W REFLEX): HIV 1&2 Ab, 4th Generation: NONREACTIVE

## 2015-06-08 NOTE — Addendum Note (Signed)
Addended byJuluis Mire on: 06/08/2015 10:21 PM   Modules accepted: Orders

## 2015-06-09 DIAGNOSIS — E559 Vitamin D deficiency, unspecified: Secondary | ICD-10-CM | POA: Insufficient documentation

## 2015-06-09 MED ORDER — SIMVASTATIN 40 MG PO TABS: 40.0000 mg | ORAL_TABLET | Freq: Every day | ORAL | Status: DC

## 2015-06-09 NOTE — Addendum Note (Signed)
Addended byJuluis Mire on: 06/09/2015 12:38 AM   Modules accepted: Orders

## 2015-06-10 ENCOUNTER — Telehealth: Payer: Self-pay | Admitting: Dietician

## 2015-06-10 NOTE — Telephone Encounter (Signed)
CDE was asked by Dr. Naaman Plummer to schedule patient for [name]'s annual diabetic  eye exam. Called Dr. Payton Emerald office and was told patient was there in may 2016 and has another appointment scheduled in September for her follow up with them. Requested records but no appointment needed

## 2015-06-10 NOTE — Telephone Encounter (Signed)
Thanks Butch Penny, appreciate it!  Dr. Naaman Plummer

## 2015-06-11 ENCOUNTER — Encounter: Payer: Self-pay | Admitting: Internal Medicine

## 2015-06-11 LAB — PHOSPHORUS: PHOSPHORUS: 3.8 mg/dL (ref 2.3–4.6)

## 2015-06-13 ENCOUNTER — Ambulatory Visit (HOSPITAL_COMMUNITY)
Admission: RE | Admit: 2015-06-13 | Discharge: 2015-06-13 | Disposition: A | Discharge status: Home / Self Care | Payer: Medicare Other | Source: Ambulatory Visit | Attending: Nurse Practitioner | Admitting: Nurse Practitioner

## 2015-06-13 ENCOUNTER — Telehealth: Payer: Self-pay | Admitting: Dietician

## 2015-06-13 ENCOUNTER — Ambulatory Visit: Payer: Medicare Other

## 2015-06-13 ENCOUNTER — Other Ambulatory Visit: Payer: Self-pay | Admitting: *Deleted

## 2015-06-13 ENCOUNTER — Other Ambulatory Visit (HOSPITAL_BASED_OUTPATIENT_CLINIC_OR_DEPARTMENT_OTHER): Payer: Medicare Other

## 2015-06-13 ENCOUNTER — Ambulatory Visit (HOSPITAL_BASED_OUTPATIENT_CLINIC_OR_DEPARTMENT_OTHER): Payer: Medicare Other | Admitting: Nurse Practitioner

## 2015-06-13 DIAGNOSIS — N183 Chronic kidney disease, stage 3 unspecified: Secondary | ICD-10-CM

## 2015-06-13 DIAGNOSIS — S99911A Unspecified injury of right ankle, initial encounter: Secondary | ICD-10-CM

## 2015-06-13 DIAGNOSIS — D631 Anemia in chronic kidney disease: Secondary | ICD-10-CM

## 2015-06-13 DIAGNOSIS — H109 Unspecified conjunctivitis: Secondary | ICD-10-CM

## 2015-06-13 DIAGNOSIS — M25572 Pain in left ankle and joints of left foot: Secondary | ICD-10-CM | POA: Insufficient documentation

## 2015-06-13 DIAGNOSIS — C9110 Chronic lymphocytic leukemia of B-cell type not having achieved remission: Secondary | ICD-10-CM

## 2015-06-13 DIAGNOSIS — M19019 Primary osteoarthritis, unspecified shoulder: Secondary | ICD-10-CM

## 2015-06-13 DIAGNOSIS — H1089 Other conjunctivitis: Secondary | ICD-10-CM

## 2015-06-13 DIAGNOSIS — M7989 Other specified soft tissue disorders: Secondary | ICD-10-CM | POA: Diagnosis not present

## 2015-06-13 DIAGNOSIS — D638 Anemia in other chronic diseases classified elsewhere: Secondary | ICD-10-CM

## 2015-06-13 DIAGNOSIS — N189 Chronic kidney disease, unspecified: Principal | ICD-10-CM

## 2015-06-13 DIAGNOSIS — E119 Type 2 diabetes mellitus without complications: Secondary | ICD-10-CM

## 2015-06-13 LAB — CBC & DIFF AND RETIC
BASO%: 0.2 % (ref 0.0–2.0)
BASOS ABS: 0 10*3/uL (ref 0.0–0.1)
EOS%: 0.2 % (ref 0.0–7.0)
Eosinophils Absolute: 0 10*3/uL (ref 0.0–0.5)
HCT: 37.7 % (ref 34.8–46.6)
HGB: 12.3 g/dL (ref 11.6–15.9)
IMMATURE RETIC FRACT: 9.5 % (ref 1.60–10.00)
LYMPH%: 54.2 % — ABNORMAL HIGH (ref 14.0–49.7)
MCH: 29.3 pg (ref 25.1–34.0)
MCHC: 32.5 g/dL (ref 31.5–36.0)
MCV: 90.1 fL (ref 79.5–101.0)
MONO#: 0.8 10*3/uL (ref 0.1–0.9)
MONO%: 3.5 % (ref 0.0–14.0)
NEUT%: 41.9 % (ref 38.4–76.8)
NEUTROS ABS: 9.7 10*3/uL — AB (ref 1.5–6.5)
Platelets: 438 10*3/uL — ABNORMAL HIGH (ref 145–400)
RBC: 4.18 10*6/uL (ref 3.70–5.45)
RDW: 17.7 % — ABNORMAL HIGH (ref 11.2–14.5)
RETIC %: 1.93 % (ref 0.70–2.10)
Retic Ct Abs: 80.67 10*3/uL (ref 33.70–90.70)
WBC: 23.1 10*3/uL — AB (ref 3.9–10.3)
lymph#: 12.6 10*3/uL — ABNORMAL HIGH (ref 0.9–3.3)

## 2015-06-13 LAB — FERRITIN CHCC: Ferritin: 229 ng/ml (ref 9–269)

## 2015-06-13 LAB — TECHNOLOGIST REVIEW

## 2015-06-13 MED ORDER — DIPHENHYDRAMINE HCL 25 MG PO CAPS
ORAL_CAPSULE | ORAL | Status: AC
  Filled 2015-06-13: qty 1

## 2015-06-13 MED ORDER — FAMOTIDINE IN NACL 20-0.9 MG/50ML-% IV SOLN
INTRAVENOUS | Status: AC
  Filled 2015-06-13: qty 50

## 2015-06-13 MED ORDER — ACETAMINOPHEN 325 MG PO TABS
ORAL_TABLET | ORAL | Status: AC
  Filled 2015-06-13: qty 2

## 2015-06-13 MED ORDER — DARBEPOETIN ALFA 200 MCG/0.4ML IJ SOSY: 200.0000 ug | PREFILLED_SYRINGE | Freq: Once | INTRAMUSCULAR | Status: DC

## 2015-06-13 MED ORDER — METHYLPREDNISOLONE SODIUM SUCC 125 MG IJ SOLR
INTRAMUSCULAR | Status: AC
  Filled 2015-06-13: qty 2

## 2015-06-13 MED ORDER — ERYTHROMYCIN 5 MG/GM OP OINT: 1.0000 "application " | TOPICAL_OINTMENT | Freq: Four times a day (QID) | OPHTHALMIC | Status: DC

## 2015-06-13 NOTE — Telephone Encounter (Addendum)
CDE was asked to assist with patient CBGs while on prednisone. CDE called patient to see if she has a way to check her blood sugar. Her husband asked caller to call back when she is home. Note that her glucose was 109 here in office and a1C was 6.6%, CBGs were 133 and 175 and this reflects the past 3 months while she was on prednisone. Thus far, her blood sugars do not need addressing per the [facility] Steriod & DM protocol (it starts at 200 mg/dl).  She has been on prednisone since 02/21/15. Do not think that her blood sugars have risen significantly to warrant self monitoring at home unless patient would like to do this. She has a follow up on 07/12/15. CDE will contoniue to follow as needed.

## 2015-06-13 NOTE — Progress Notes (Signed)
Patient presented to clinic for Rituxan infusion today. RN noted patient had severe bruising to left foot and ankle. Bilateral edema with left greater than right. Bilateral lower extremity edema and tenderness. Full range of motion present. Able to bear weight. Reports pain in left leg, ankle and foot. Patient states bruising appeared this morning. Denies fall or other injury.  Patient also with bilateral injected sclera. Both eyes with green discharge. Reports some vision blurring at times. States discharge is worse in the morning. She also had elevated blood pressure. RN notified Dr. [name] of above and Selena Lesser, NP evaluated patient.  Patient was sent to radiology for imaging. Decision was made to hold treatment for today. Patient was discharged from infusion room to the symptom management clinic for evaluation and followup by Selena Lesser, NP.   RN also noted that patient smelled strongly of urine and was worried about food. Patient received nutrition during visit, and social work will be contacted for welfare followup.

## 2015-06-14 ENCOUNTER — Telehealth: Payer: Self-pay | Admitting: Oncology

## 2015-06-14 ENCOUNTER — Other Ambulatory Visit: Payer: Self-pay | Admitting: Nurse Practitioner

## 2015-06-14 ENCOUNTER — Encounter: Payer: Self-pay | Admitting: Nurse Practitioner

## 2015-06-14 ENCOUNTER — Other Ambulatory Visit: Payer: Self-pay | Admitting: *Deleted

## 2015-06-14 ENCOUNTER — Encounter: Payer: Self-pay | Admitting: *Deleted

## 2015-06-14 DIAGNOSIS — E785 Hyperlipidemia, unspecified: Secondary | ICD-10-CM

## 2015-06-14 DIAGNOSIS — C9110 Chronic lymphocytic leukemia of B-cell type not having achieved remission: Secondary | ICD-10-CM

## 2015-06-14 DIAGNOSIS — M150 Primary generalized (osteo)arthritis: Secondary | ICD-10-CM

## 2015-06-14 DIAGNOSIS — R269 Unspecified abnormalities of gait and mobility: Secondary | ICD-10-CM

## 2015-06-14 DIAGNOSIS — M159 Polyosteoarthritis, unspecified: Secondary | ICD-10-CM

## 2015-06-14 DIAGNOSIS — H109 Unspecified conjunctivitis: Secondary | ICD-10-CM | POA: Insufficient documentation

## 2015-06-14 DIAGNOSIS — S99912A Unspecified injury of left ankle, initial encounter: Secondary | ICD-10-CM

## 2015-06-14 DIAGNOSIS — S99919A Unspecified injury of unspecified ankle, initial encounter: Secondary | ICD-10-CM | POA: Insufficient documentation

## 2015-06-14 DIAGNOSIS — N183 Chronic kidney disease, stage 3 (moderate): Secondary | ICD-10-CM

## 2015-06-14 DIAGNOSIS — D631 Anemia in chronic kidney disease: Secondary | ICD-10-CM

## 2015-06-14 DIAGNOSIS — N189 Chronic kidney disease, unspecified: Secondary | ICD-10-CM

## 2015-06-14 DIAGNOSIS — E1122 Type 2 diabetes mellitus with diabetic chronic kidney disease: Secondary | ICD-10-CM

## 2015-06-14 NOTE — Assessment & Plan Note (Signed)
Patient presented to the [facility] today to receive maintenance Rituxan chemotherapy.  However, patient was noted to have left ankle and foot edema, significant bruising, and some mild tenderness.  Patient denies any known injury or trauma.  She states that she has chronic edema to her bilateral lower extremities.  She reports that she had a physical exam per her primary care physician last week; and one of the physicians was squeezing her bilateral lower extremities to gauge the extent of her peripheral edema.  She feels that this is the only reason she could have the bruising.  On exam.  It does appear the patient has significant bruising to her circumferential left ankle, some increased bruising to her left heel, and also some bruising to her toes.  The entire area is also is fairly tender on exam.  All pulses are palpable in all extremities are warm.  Patient states that she is able to bear weight with no difficulty; and continues to ablate with the assistance of a cane as baseline.  Left ankle and foot x-rays obtained this afternoon revealed:  Mild diffuse soft tissue swelling about the lower leg and ankle without associated acute fracture or dislocation.  Platelet count 438.  Patient continues to deny any knowledge of known injury or trauma to her left lower extremity.  Advised patient she should elevate her legs above the level of for heart when at rest.  If at all possible.  She should also try wrapping her ankle with an Ace bandage.  Also advised patient to follow-up with her primary care physician next week; and may also need to consider follow-up with orthopedist if left ankle and foot do not improve.

## 2015-06-14 NOTE — Telephone Encounter (Signed)
Spoke with patient re appointments for 7/27 @ 9:15 am. Per 7/22 pof sent message to clinical social work and requested translator for appointment and updated language preference in EPIC. Patient aware translator will be present.

## 2015-06-14 NOTE — Assessment & Plan Note (Signed)
Patient presented to the [facility] today to receive her maintenance Rituxan chemotherapy.  However, patient was noted to have significant edema and bruising to her left ankle and foot with no known injury or trauma.  Decision was made to hold the Rituxan chemotherapy until next week, so patient could obtain x-rays for further evaluation of her left ankle injury.  Patient will also continue to receive Aranesp injections on an as-needed basis.  Patient will also continue to receive iron infusions only when her ferritin is less than 100.  Ferritin level today was 229.  Advised patient that the cancer Center schedule would call her to reschedule labs, visit, and chemotherapy.  Of note-will also schedule a social work consult for follow-up as well.  Also need to consider requiring a interpreter for all future visits to assist in communication.

## 2015-06-14 NOTE — Progress Notes (Signed)
SYMPTOM MANAGEMENT CLINIC   HPI: [place] 81 y.o. female diagnosed with chronic lymphocytic leukemia.  Currently undergoing maintenance Rituxan chemotherapy.  Also receives Aranesp injections and iron infusions on an as-needed basis.  Patient presented to the [facility] today to receive maintenance Rituxan chemotherapy.  However, patient was noted to have left ankle and foot edema, significant bruising, and some mild tenderness.  Patient denies any known injury or trauma.  She states that she has chronic edema to her bilateral lower extremities.  She reports that she had a physical exam per her primary care physician last week; and one of the physicians was squeezing her bilateral lower extremities to gauge the extent of her peripheral edema.  She feels that this is the only reason she could have the bruising.  Patient has developed bilateral scleral erythema and purulent discharge for the past few days.  She denies any excessive tearing or pain to her eyes.  She denies any vision changes.  HPI  ROS  Past Medical History  Diagnosis Date  . Diabetes mellitus   . Hyperlipidemia   . Hypertension   . Carotid stenosis, right 08/2005    40-60%  . History of colonic polyps 2002    Normal colonoscopy 08/2004  . CKD (chronic kidney disease) stage 2, GFR 60-89 ml/min   . CLL (chronic lymphocytic leukemia)     Dr. [name]. s/p Rituxan  . Pulse irregularity     Etiology->frequent PACs, normal EKG    History reviewed. No pertinent past surgical history.  has Type 2 diabetes mellitus with stage 3 chronic kidney disease; Hyperlipidemia; Essential hypertension; CAROTID ARTERY STENOSIS, RIGHT; CKD (chronic kidney disease) stage 3, GFR 30-59 ml/min; COLONIC POLYPS, HX OF; CLL (chronic lymphocytic leukemia); Abnormality of gait; Advance directive discussed with patient; Osteoarthritis; Gastroesophageal reflux disease without esophagitis; Anemia of renal disease; Healthcare maintenance;  Hypersensitivity reaction; Vitamin D insufficiency; Conjunctivitis; and Ankle injury on her problem list.    has No Known Allergies.    Medication List       This list is accurate as of: 06/13/15 11:59 PM.  Always use your most recent med list.               acetaminophen 500 MG tablet  Commonly known as:  TYLENOL  Take 1,000 mg by mouth 2 (two) times daily as needed. For pain     allopurinol 100 MG tablet  Commonly known as:  ZYLOPRIM  Take 1 tablet (100 mg total) by mouth daily.     CALCIUM PLUS VITAMIN D PO  Take 1 tablet by mouth 2 (two) times daily.     dipyridamole-aspirin 200-25 MG per 12 hr capsule  Commonly known as:  AGGRENOX  TAKE ONE CAPSULE BY MOUTH TWICE A DAY     erythromycin ophthalmic ointment  Commonly known as:  ROMYCIN  Place 1 application into both eyes 4 (four) times daily.     Fish Oil 1200 MG Caps  Take 1 capsule by mouth daily.     furosemide 40 MG tablet  Commonly known as:  LASIX  Take 1 tablet (40 mg total) by mouth daily.     losartan 100 MG tablet  Commonly known as:  COZAAR  Take 1 tablet (100 mg total) by mouth daily.     omeprazole 20 MG capsule  Commonly known as:  PRILOSEC  Take 1 capsule (20 mg total) by mouth daily.     predniSONE 10 MG tablet  Commonly known as:  DELTASONE  Take one tablet (10 mg) daily with breakfast     simvastatin 40 MG tablet  Commonly known as:  ZOCOR  Take 1 tablet (40 mg total) by mouth at bedtime.     SYSTANE OP  Place 1 drop into both eyes 2 (two) times daily.     traMADol-acetaminophen 37.5-325 MG per tablet  Commonly known as:  ULTRACET  Take 1-2 tablets by mouth every 6 (six) hours as needed for moderate pain.         PHYSICAL EXAMINATION  Oncology Vitals 06/13/2015 06/07/2015 05/16/2015 04/04/2015 04/04/2015 04/04/2015 04/04/2015  Height - 140 cm 140 cm - - - -  Weight - - 62.506 kg - - - -  Weight (lbs) - - 137 lbs 13 oz - - - -  BMI (kg/m2) - - 32.03 kg/m2 - - - -  Temp - 98 98.1  97.9 98.7 98.7 98.8  Pulse 81 76 82 87 43 48 92  Resp - - 18 20 20 20 20   Resp (Historical as of 06/23/12) - - - - - - -  SpO2 - 97 100 94 96 96 94  BSA (m2) - - 1.56 m2 - - - -   BP Readings from Last 3 Encounters:  06/13/15 188/63  06/07/15 182/110  05/16/15 178/64    Physical Exam  Constitutional: She is oriented to person, place, and time and well-developed, well-nourished, and in no distress.  HENT:  Head: Normocephalic and atraumatic.  Mouth/Throat: Oropharynx is clear and moist.  Eyes: EOM are normal. Pupils are equal, round, and reactive to light. Right eye exhibits discharge. Left eye exhibits discharge. No scleral icterus.  Patient with bilateral sclera erythema and crusted purulent discharge to eyes.  Neck: Normal range of motion. Neck supple. No JVD present. No tracheal deviation present. No thyromegaly present.  Cardiovascular: Normal rate, regular rhythm, normal heart sounds and intact distal pulses.   Pulmonary/Chest: Effort normal and breath sounds normal. No respiratory distress. She has no wheezes. She has no rales. She exhibits no tenderness.  Abdominal: Soft. Bowel sounds are normal. She exhibits no distension and no mass. There is no tenderness. There is no rebound and no guarding.  Musculoskeletal: Normal range of motion. She exhibits edema and tenderness.  Left ankle and foot with increased bruising, edema, and mild tenderness on exam.  See attached photos.  Lymphadenopathy:    She has no cervical adenopathy.  Neurological: She is alert and oriented to person, place, and time.  Walks with assistance of a cane.  Is able to bear weight to her left lower extremity with no difficulty.  Skin: Skin is warm and dry. No rash noted. No erythema. No pallor.  Psychiatric: Affect normal.  Nursing note and vitals reviewed.   LABORATORY DATA:. Appointment on 06/13/2015  Component Date Value Ref Range Status  . Ferritin 06/13/2015 229  9 - 269 ng/ml Final  . WBC  06/13/2015 23.1* 3.9 - 10.3 10e3/uL Final  . NEUT# 06/13/2015 9.7* 1.5 - 6.5 10e3/uL Final  . HGB 06/13/2015 12.3  11.6 - 15.9 g/dL Final  . HCT 06/13/2015 37.7  34.8 - 46.6 % Final  . Platelets 06/13/2015 438* 145 - 400 10e3/uL Final  . MCV 06/13/2015 90.1  79.5 - 101.0 fL Final  . MCH 06/13/2015 29.3  25.1 - 34.0 pg Final  . MCHC 06/13/2015 32.5  31.5 - 36.0 g/dL Final  . RBC 06/13/2015 4.18  3.70 - 5.45 10e6/uL Final  . RDW 06/13/2015 17.7*  11.2 - 14.5 % Final  . lymph# Jun 19, 2015 12.6* 0.9 - 3.3 10e3/uL Final  . MONO# 19-Jun-2015 0.8  0.1 - 0.9 10e3/uL Final  . Eosinophils Absolute 06/19/15 0.0  0.0 - 0.5 10e3/uL Final  . Basophils Absolute 06-19-2015 0.0  0.0 - 0.1 10e3/uL Final  . NEUT% 19-Jun-2015 41.9  38.4 - 76.8 % Final  . LYMPH% 06-19-15 54.2* 14.0 - 49.7 % Final  . MONO% 2015-06-19 3.5  0.0 - 14.0 % Final  . EOS% 06/19/2015 0.2  0.0 - 7.0 % Final  . BASO% 19-Jun-2015 0.2  0.0 - 2.0 % Final  . Retic % 19-Jun-2015 1.93  0.70 - 2.10 % Final  . Retic Ct Abs 2015-06-19 80.67  33.70 - 90.70 10e3/uL Final  . Immature Retic Fract 06-19-2015 9.50  1.60 - 10.00 % Final  . Technologist Review 06-19-15 Variant lymphs present   Final   Left ankle/foot:     Left ankle/foot side view:       RADIOGRAPHIC STUDIES: Dg Ankle Complete Left  Jun 19, 2015   CLINICAL DATA:  Diffuse left foot and ankle pain. Swelling and bruising since last week. No known injury.  EXAM: LEFT ANKLE COMPLETE - 3+ VIEW  COMPARISON:  Left foot radiographs -earlier same day  FINDINGS: Osteopenia. Peripherally corticated ossicle adjacent to the distal end of the fibula is favored to represent the sequela of remote avulsive injury. There is mild diffuse soft tissue swelling about the lower leg and ankle without acute displaced fracture. Joint spaces are preserved. Ankle mortise is preserved. No definite ankle joint effusion. Small plantar calcaneal spur. Distal vascular calcifications. No radiopaque foreign body.   IMPRESSION: Mild diffuse soft tissue swelling about the lower leg and ankle without associated acute fracture or dislocation.   Electronically Signed   By: Sandi Mariscal M.D.   On: Jun 19, 2015 15:05   Dg Foot Complete Left  2015-06-19   CLINICAL DATA:  Diffuse left foot and ankle pain, swelling and bruising since last week. No known injury.  EXAM: LEFT FOOT - COMPLETE 3+ VIEW  COMPARISON:  Left ankle obtained at the same time.  FINDINGS: Periarticular calcifications at the first MTP joint. Additional calcifications in the region of the articulation of the first metatarsal and medial cuneiform. Atheromatous arterial calcifications. No fracture or dislocation seen. Mild calcaneal spur formation. Second and third PIP and DIP joint degenerative changes. Mild first MTP joint degenerative changes.  IMPRESSION: No fracture.  Degenerative changes.   Electronically Signed   By: Claudie Revering M.D.   On: 06-19-2015 14:14    ASSESSMENT/PLAN:    Ankle injury Patient presented to the [facility] today to receive maintenance Rituxan chemotherapy.  However, patient was noted to have left ankle and foot edema, significant bruising, and some mild tenderness.  Patient denies any known injury or trauma.  She states that she has chronic edema to her bilateral lower extremities.  She reports that she had a physical exam per her primary care physician last week; and one of the physicians was squeezing her bilateral lower extremities to gauge the extent of her peripheral edema.  She feels that this is the only reason she could have the bruising.  On exam.  It does appear the patient has significant bruising to her circumferential left ankle, some increased bruising to her left heel, and also some bruising to her toes.  The entire area is also is fairly tender on exam.  All pulses are palpable in all extremities are warm.  Patient  states that she is able to bear weight with no difficulty; and continues to ablate with the  assistance of a cane as baseline.  Left ankle and foot x-rays obtained this afternoon revealed:  Mild diffuse soft tissue swelling about the lower leg and ankle without associated acute fracture or dislocation.  Platelet count 438.  Patient continues to deny any knowledge of known injury or trauma to her left lower extremity.  Advised patient she should elevate her legs above the level of for heart when at rest.  If at all possible.  She should also try wrapping her ankle with an Ace bandage.  Also advised patient to follow-up with her primary care physician next week; and may also need to consider follow-up with orthopedist if left ankle and foot do not improve.  CLL (chronic lymphocytic leukemia) Patient presented to the [facility] today to receive her maintenance Rituxan chemotherapy.  However, patient was noted to have significant edema and bruising to her left ankle and foot with no known injury or trauma.  Decision was made to hold the Rituxan chemotherapy until next week, so patient could obtain x-rays for further evaluation of her left ankle injury.  Patient will also continue to receive Aranesp injections on an as-needed basis.  Patient will also continue to receive iron infusions only when her ferritin is less than 100.  Ferritin level today was 229.  Advised patient that the cancer Center schedule would call her to reschedule labs, visit, and chemotherapy.  Of note-will also schedule a social work consult for follow-up as well.  Also need to consider requiring a interpreter for all future visits to assist in communication.  Conjunctivitis Patient has developed bilateral scleral erythema and purulent discharge for the past few days.  She denies any excessive tearing or pain to her eyes.  She denies any vision changes.  On exam.  It does appear the patient has erythema to bilateral sclera and crusting purulent drainage to bilateral eyes.  Will prescribe erythromycin ointment  for bilateral eyes.  To treat conjunctivitis.  Advised patient to call/return or follow-up with her primary care physician for worsening conjunctivitis symptoms.  Also, advised the patient follow up with an ophthalmologist if symptoms do not improve.   The plan is to have the [place] nurse follow-up with patient's primary care provider regarding left ankle and foot injury.  Will request that primary care provider provide all further follow-up/evaluation/care regarding her left lower extremity injury.  Patient stated understanding of all instructions; and was in agreement with this plan of care. The patient knows to call the clinic with any problems, questions or concerns.   Review/collaboration with Dr. [name] regarding all aspects of patient's visit today.   Total time spent with patient was 40 minutes;  with greater than 75 percent of that time spent in face to face counseling regarding patient's symptoms,  and coordination of care and follow up.  Disclaimer: This note was dictated with voice recognition software. Similar sounding words can inadvertently be transcribed and may not be corrected upon review.   Drue Second, NP 06/14/2015

## 2015-06-14 NOTE — Assessment & Plan Note (Signed)
Patient has developed bilateral scleral erythema and purulent discharge for the past few days.  She denies any excessive tearing or pain to her eyes.  She denies any vision changes.  On exam.  It does appear the patient has erythema to bilateral sclera and crusting purulent drainage to bilateral eyes.  Will prescribe erythromycin ointment for bilateral eyes.  To treat conjunctivitis.  Advised patient to call/return or follow-up with her primary care physician for worsening conjunctivitis symptoms.  Also, advised the patient follow up with an ophthalmologist if symptoms do not improve.

## 2015-06-14 NOTE — Progress Notes (Signed)
[place] Work  Clinical Social Work was referred by nurse for assessment of psychosocial needs due to possible "help needed at home".  Clinical Social Worker attempted to see pt while she was at [facility], but she had already left. CSW contacted patient at home to offer support and assess for needs. CSW spoke with pt's husband who stated "we need help, my wife can't walk". CSW explained to him levels of care/help in the home and what [facility] would cover and what would have to be paid out of pocket. Husband open to referral to home health for safety assessment, RN, PT eval and CSW. This would allow a better picture of needs at home and what the actual environment deficits include. CSW forwarded this request to RN for Md orders. Pt does not return to [facility] until 07/11/15 and [place] would help bridge gap and ensure safety at home and/or determine actual level of care needed.   Clinical Social Work interventions: Assessment of needs Pt advocacy Loren Racer, [place] Worker [place]  [place] Phone: [phone] Fax: [phone]

## 2015-06-18 ENCOUNTER — Other Ambulatory Visit: Payer: Self-pay | Admitting: Nurse Practitioner

## 2015-06-19 ENCOUNTER — Encounter: Payer: Self-pay | Admitting: Nurse Practitioner

## 2015-06-19 ENCOUNTER — Telehealth: Payer: Self-pay | Admitting: Nurse Practitioner

## 2015-06-19 ENCOUNTER — Encounter: Payer: Self-pay | Admitting: Licensed Clinical Social Worker

## 2015-06-19 ENCOUNTER — Ambulatory Visit (HOSPITAL_BASED_OUTPATIENT_CLINIC_OR_DEPARTMENT_OTHER): Payer: Medicare Other | Admitting: Nurse Practitioner

## 2015-06-19 ENCOUNTER — Other Ambulatory Visit (HOSPITAL_BASED_OUTPATIENT_CLINIC_OR_DEPARTMENT_OTHER): Payer: Medicare Other

## 2015-06-19 ENCOUNTER — Ambulatory Visit (HOSPITAL_BASED_OUTPATIENT_CLINIC_OR_DEPARTMENT_OTHER): Payer: Medicare Other

## 2015-06-19 VITALS — BP 170/56 | HR 76 | Temp 97.7°F | Resp 18 | Ht <= 58 in | Wt 130.5 lb

## 2015-06-19 VITALS — BP 155/68 | HR 97 | Temp 97.5°F | Resp 18

## 2015-06-19 DIAGNOSIS — E119 Type 2 diabetes mellitus without complications: Secondary | ICD-10-CM

## 2015-06-19 DIAGNOSIS — C9110 Chronic lymphocytic leukemia of B-cell type not having achieved remission: Secondary | ICD-10-CM

## 2015-06-19 DIAGNOSIS — N393 Stress incontinence (female) (male): Secondary | ICD-10-CM

## 2015-06-19 DIAGNOSIS — R6889 Other general symptoms and signs: Secondary | ICD-10-CM

## 2015-06-19 DIAGNOSIS — Z5112 Encounter for antineoplastic immunotherapy: Secondary | ICD-10-CM | POA: Diagnosis present

## 2015-06-19 DIAGNOSIS — N183 Chronic kidney disease, stage 3 unspecified: Secondary | ICD-10-CM

## 2015-06-19 DIAGNOSIS — D631 Anemia in chronic kidney disease: Secondary | ICD-10-CM | POA: Diagnosis not present

## 2015-06-19 DIAGNOSIS — M19019 Primary osteoarthritis, unspecified shoulder: Secondary | ICD-10-CM

## 2015-06-19 DIAGNOSIS — N182 Chronic kidney disease, stage 2 (mild): Secondary | ICD-10-CM | POA: Diagnosis not present

## 2015-06-19 DIAGNOSIS — D638 Anemia in other chronic diseases classified elsewhere: Secondary | ICD-10-CM

## 2015-06-19 LAB — CBC & DIFF AND RETIC
BASO%: 0.2 % (ref 0.0–2.0)
Basophils Absolute: 0 10*3/uL (ref 0.0–0.1)
EOS%: 0.3 % (ref 0.0–7.0)
Eosinophils Absolute: 0.1 10*3/uL (ref 0.0–0.5)
HEMATOCRIT: 38.7 % (ref 34.8–46.6)
HEMOGLOBIN: 12.1 g/dL (ref 11.6–15.9)
Immature Retic Fract: 4.9 % (ref 1.60–10.00)
LYMPH#: 17 10*3/uL — AB (ref 0.9–3.3)
LYMPH%: 56.1 % — ABNORMAL HIGH (ref 14.0–49.7)
MCH: 28.8 pg (ref 25.1–34.0)
MCHC: 31.2 g/dL — ABNORMAL LOW (ref 31.5–36.0)
MCV: 92.2 fL (ref 79.5–101.0)
MONO#: 1.8 10*3/uL — AB (ref 0.1–0.9)
MONO%: 5.8 % (ref 0.0–14.0)
NEUT#: 11.4 10*3/uL — ABNORMAL HIGH (ref 1.5–6.5)
NEUT%: 37.6 % — AB (ref 38.4–76.8)
Platelets: 431 10*3/uL — ABNORMAL HIGH (ref 145–400)
RBC: 4.19 10*6/uL (ref 3.70–5.45)
RDW: 17.9 % — AB (ref 11.2–14.5)
Retic %: 1.43 % (ref 0.70–2.10)
Retic Ct Abs: 59.92 10*3/uL (ref 33.70–90.70)
WBC: 30.4 10*3/uL — AB (ref 3.9–10.3)

## 2015-06-19 LAB — TECHNOLOGIST REVIEW

## 2015-06-19 LAB — FERRITIN CHCC: Ferritin: 229 ng/ml (ref 9–269)

## 2015-06-19 MED ORDER — DIPHENHYDRAMINE HCL 50 MG/ML IJ SOLN
25.0000 mg | Freq: Once | INTRAMUSCULAR | Status: AC | PRN
  Administered 2015-06-19: 25 mg via INTRAVENOUS

## 2015-06-19 MED ORDER — FAMOTIDINE IN NACL 20-0.9 MG/50ML-% IV SOLN
INTRAVENOUS | Status: AC
  Filled 2015-06-19: qty 50

## 2015-06-19 MED ORDER — MEPERIDINE HCL 25 MG/ML IJ SOLN
12.5000 mg | Freq: Once | INTRAMUSCULAR | Status: AC
  Administered 2015-06-19: 12.5 mg via INTRAVENOUS

## 2015-06-19 MED ORDER — DIPHENHYDRAMINE HCL 25 MG PO CAPS
ORAL_CAPSULE | ORAL | Status: AC
  Filled 2015-06-19: qty 1

## 2015-06-19 MED ORDER — METHYLPREDNISOLONE SODIUM SUCC 125 MG IJ SOLR
INTRAMUSCULAR | Status: AC
  Filled 2015-06-19: qty 2

## 2015-06-19 MED ORDER — DIPHENHYDRAMINE HCL 25 MG PO CAPS
25.0000 mg | ORAL_CAPSULE | Freq: Once | ORAL | Status: AC
  Administered 2015-06-19: 25 mg via ORAL

## 2015-06-19 MED ORDER — RITUXIMAB CHEMO INJECTION 500 MG/50ML
100.0000 mg | Freq: Once | INTRAVENOUS | Status: AC
  Administered 2015-06-19: 100 mg via INTRAVENOUS
  Filled 2015-06-19: qty 10

## 2015-06-19 MED ORDER — METHYLPREDNISOLONE SODIUM SUCC 125 MG IJ SOLR
125.0000 mg | Freq: Every day | INTRAMUSCULAR | Status: DC
  Administered 2015-06-19: 125 mg via INTRAVENOUS

## 2015-06-19 MED ORDER — MEPERIDINE HCL 25 MG/ML IJ SOLN
INTRAMUSCULAR | Status: AC
  Filled 2015-06-19: qty 1

## 2015-06-19 MED ORDER — SODIUM CHLORIDE 0.9 % IV SOLN: 100.0000 mg | Freq: Once | INTRAVENOUS | Status: DC

## 2015-06-19 MED ORDER — FAMOTIDINE IN NACL 20-0.9 MG/50ML-% IV SOLN
20.0000 mg | Freq: Two times a day (BID) | INTRAVENOUS | Status: DC
  Administered 2015-06-19: 20 mg via INTRAVENOUS

## 2015-06-19 MED ORDER — SODIUM CHLORIDE 0.9 % IV SOLN
INTRAVENOUS | Status: DC
  Administered 2015-06-19: 12:00:00 via INTRAVENOUS

## 2015-06-19 MED ORDER — ACETAMINOPHEN 325 MG PO TABS
650.0000 mg | ORAL_TABLET | Freq: Once | ORAL | Status: AC
  Administered 2015-06-19: 650 mg via ORAL

## 2015-06-19 MED ORDER — ACETAMINOPHEN 325 MG PO TABS
ORAL_TABLET | ORAL | Status: AC
  Filled 2015-06-19: qty 2

## 2015-06-19 NOTE — Progress Notes (Signed)
CSW received documentation from [facility] CSW regarding pt's need for [facility] services.  Referral was placed by [facility] physician.  CSW placed call to [facility], confirmed pt's [facility] 06/15/15.

## 2015-06-19 NOTE — Progress Notes (Signed)
1336: Patient began to complain of chills. RN observed rigors to hands and bottom lip. Rituximab paused, VS taken, and IV fluids given to patient. Selena Lesser NP notified and arrived to patient bedside. Patient given Benadryl 25 mg IV and Demerol 12.5 mg IV. Rigors began to decrease.  Patient began to experience increased  blood pressure from 201/59. Patient blood pressure at 1452 was 161/55 and rituximab restarted and was completely finished at 1535. Patient discharged in stable condition at 1600.

## 2015-06-19 NOTE — Progress Notes (Signed)
ID: [name]   DOB: 1921-10-21  MR#: [id number]  CSN#:[id number]  PCP: Juluis Mire, MD GYN: SU:  OTHER MD: Susa Day  CHIEF COMPLAINT: Chronic lymphoid leukemia, anemia of renal failure  CURRENT TREATMENT: Aranesp,  Low-dose prednisone, rituximab  INTERVAL HISTORY: [name] returns for follow up of her chronic lymphoid leukemia accompanied by her husband [name]. She is due for rituximab today. Her last dose was 8 weeks ago. She tolerates the infusion well with no complaints or reactions. The interval history is remarkable for a visit to our symptom management clinic last week with complaints of left ankle and foot edema and tenderness. She denies falls. The edema is resolving now, and is significantly less tender.   REVIEW OF SYSTEMS: [name] denies fevers, chills, nausea, or vomiting. She moves her bowels well but has some stress urinary incontinence. She has no night sweats, weakness or fatigue. She uses a cane to ambulate and has some arthritis pain. She uses tylenol only for this. She has been sleeping better lately. A detailed review of systems is otherwise stable.  PAST MEDICAL HISTORY: Past Medical History  Diagnosis Date  . Diabetes mellitus   . Hyperlipidemia   . Hypertension   . Carotid stenosis, right 08/2005    40-60%  . History of colonic polyps 2002    Normal colonoscopy 08/2004  . CKD (chronic kidney disease) stage 2, GFR 60-89 ml/min   . CLL (chronic lymphocytic leukemia)     Dr. [name]. s/p Rituxan  . Pulse irregularity     Etiology->frequent PACs, normal EKG    FAMILY HISTORY No family history on file.  GYNECOLOGIC HISTORY: GX P0  SOCIAL HISTORY: She is originally from [place]. 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 They have no children. They are Catholic.  ADVANCED DIRECTIVES: in place  HEALTH MAINTENANCE: History  Substance Use Topics  . Smoking status: Former Research scientist (life sciences)  . Smokeless tobacco: Not on file  . Alcohol Use: Not  on file     Colonoscopy: Buccini  PAP:  Bone density:  Lipid panel:  No Known Allergies  Current Outpatient Prescriptions  Medication Sig Dispense Refill  . acetaminophen (TYLENOL) 500 MG tablet Take 1,000 mg by mouth 2 (two) times daily as needed. For pain    . allopurinol (ZYLOPRIM) 100 MG tablet Take 1 tablet (100 mg total) by mouth daily. 90 tablet 3  . Calcium Carbonate-Vitamin D (CALCIUM PLUS VITAMIN D PO) Take 1 tablet by mouth 2 (two) times daily.    [name] dipyridamole-aspirin (AGGRENOX) 200-25 MG per 12 hr capsule TAKE ONE CAPSULE BY MOUTH TWICE A DAY 200 capsule 2  . erythromycin (ROMYCIN) ophthalmic ointment Place 1 application into both eyes 4 (four) times daily. 3.5 g 0  . furosemide (LASIX) 40 MG tablet Take 1 tablet (40 mg total) by mouth daily. 90 tablet 3  . losartan (COZAAR) 100 MG tablet Take 1 tablet (100 mg total) by mouth daily. 90 tablet 3  . Omega-3 Fatty Acids (FISH OIL) 1200 MG CAPS Take 1 capsule by mouth daily.    [name] omeprazole (PRILOSEC) 20 MG capsule Take 1 capsule (20 mg total) by mouth daily. 90 capsule 3  . Polyethyl Glycol-Propyl Glycol (SYSTANE OP) Place 1 drop into both eyes 2 (two) times daily.    . predniSONE (DELTASONE) 10 MG tablet Take one tablet (10 mg) daily with breakfast 30 tablet 6  . simvastatin (ZOCOR) 40 MG tablet Take 1 tablet (40 mg total) by mouth  at bedtime. 90 tablet 3  . traMADol-acetaminophen (ULTRACET) 37.5-325 MG per tablet Take 1-2 tablets by mouth every 6 (six) hours as needed for moderate pain. (Patient not taking: Reported on 06/19/2015) 30 tablet 3   No current facility-administered medications for this visit.    OBJECTIVE: Elderly white woman in no acute distress Filed Vitals:   06/19/15 1036  BP: 170/56  Pulse: 76  Temp:   Resp:      Body mass index is 30.33 kg/(m^2).    ECOG FS: 2  Skin: warm, dry, healing bruises to left toes and ankles HEENT: sclerae anicteric, conjunctivae pink, oropharynx clear. No thrush or  mucositis.  Lymph Nodes: No cervical or supraclavicular lymphadenopathy  Lungs: clear to auscultation bilaterally, no rales, wheezes, or rhonci  Heart: regular rate and rhythm  Abdomen: round, soft, non tender, positive bowel sounds  Musculoskeletal: No focal spinal tenderness, no peripheral edema  Neuro: non focal, well oriented, positive affect  Breasts:deferred    LAB RESULTS: Lab Results  Component Value Date   WBC 30.4* 06/19/2015   NEUTROABS 11.4* 06/19/2015   HGB 12.1 06/19/2015   HCT 38.7 06/19/2015   MCV 92.2 06/19/2015   PLT 431* 06/19/2015      Chemistry      Component Value Date/Time   NA 147* 06/07/2015 1550   NA 143 07/10/2014 1447   K 4.5 06/07/2015 1550   K 4.3 07/10/2014 1447   CL 105 06/07/2015 1550   CL 103 04/18/2013 1422   CO2 30 06/07/2015 1550   CO2 22 07/10/2014 1447   BUN 25* 06/07/2015 1550   BUN 52.0* 07/10/2014 1447   CREATININE 1.15* 06/07/2015 1550   CREATININE 2.4* 07/10/2014 1447   CREATININE 1.89* 06/28/2012 1425      Component Value Date/Time   CALCIUM 9.2 06/07/2015 1550   CALCIUM 9.4 07/10/2014 1447   CALCIUM 10.5 08/07/2008 0000   ALKPHOS 91 06/07/2015 1550   ALKPHOS 100 07/10/2014 1447   AST 22 06/07/2015 1550   AST 21 07/10/2014 1447   ALT 12 06/07/2015 1550   ALT 10 07/10/2014 1447   BILITOT 0.5 06/07/2015 1550   BILITOT 0.27 07/10/2014 1447       No results found for: LABCA2  No components found for: [id number]  No results for input(s): INR in the last 168 hours.  Urinalysis No results found for: COLORURINE Results for OVA, GILLENTINE (MRN [id number]) as of 06/19/2015 10:58  Ref. Range 03/28/2015 11:19 04/04/2015 11:26 05/16/2015 11:59 2015-07-08 10:59 06/19/2015 09:03  lymph# Latest Ref Range: 0.9-3.3 10e3/uL 3.3 5.1 (H) 4.5 (H) 12.6 (H) 17.0 (H)    STUDIES: Dg Ankle Complete Left  Jul 08, 2015   CLINICAL DATA:  Diffuse left foot and ankle pain. Swelling and bruising since last week. No known injury.  EXAM: LEFT  ANKLE COMPLETE - 3+ VIEW  COMPARISON:  Left foot radiographs -earlier same day  FINDINGS: Osteopenia. Peripherally corticated ossicle adjacent to the distal end of the fibula is favored to represent the sequela of remote avulsive injury. There is mild diffuse soft tissue swelling about the lower leg and ankle without acute displaced fracture. Joint spaces are preserved. Ankle mortise is preserved. No definite ankle joint effusion. Small plantar calcaneal spur. Distal vascular calcifications. No radiopaque foreign body.  IMPRESSION: Mild diffuse soft tissue swelling about the lower leg and ankle without associated acute fracture or dislocation.   Electronically Signed   By: Sandi Mariscal M.D.   On: 07/08/2015 15:05   Dg Foot  Complete Left  06/13/2015   CLINICAL DATA:  Diffuse left foot and ankle pain, swelling and bruising since last week. No known injury.  EXAM: LEFT FOOT - COMPLETE 3+ VIEW  COMPARISON:  Left ankle obtained at the same time.  FINDINGS: Periarticular calcifications at the first MTP joint. Additional calcifications in the region of the articulation of the first metatarsal and medial cuneiform. Atheromatous arterial calcifications. No fracture or dislocation seen. Mild calcaneal spur formation. Second and third PIP and DIP joint degenerative changes. Mild first MTP joint degenerative changes.  IMPRESSION: No fracture.  Degenerative changes.   Electronically Signed   By: Claudie Revering M.D.   On: 06/13/2015 14:14     ASSESSMENT: 81 y.o.  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  Status post treatment with Rituxan in 2004 and 2007, with no treatment after November 2007.   (2) anemia, possibly due to CLL progression but certainly worsened by chronic renal insufficiency     (a) aranesp started 02/07/2015, to be repeated every 28 days  (b) feraheme to be given whenever  ferritin falls to <100  (3) rituximab started 02/14/2015, repeated weekly x7 at lod dose with excellent response  (a) allopurinol started 02/07/2015  (b) hepatitis serologies drawn 02/11/2015 negative  (c) rituximab maintenance to start 06/03/2015, to be repeated every 8 weeks  PLAN:  [name] looks and feels well. The labs were reviewed in detail and her lymphocyte count is trending upwards. I consulted with Dr. [name] and he suggests proceeding with rituximab as planned today, but adding another dose in 4 weeks. Originally the plans were to spread out infusions to every 8 weeks.    She will not need aranesp today, as her hgb is 12.1. Her ferritin is normal. We will continue to check a CBC monthly, administer aranesp when appropriate, and feraheme should her ferritin drop below 100.   [name] will return on 8/24 for a follow up and her next cycle of rituximab. She understands and agrees with this plan. She knows the goal of treatment in her case is control. She has been encouraged to call with any issues that might arise before her next visit here.    [name] Gather Clydia Nieves    06/19/2015

## 2015-06-19 NOTE — Telephone Encounter (Signed)
Appointments made and avs pritned for patient °

## 2015-06-20 NOTE — Progress Notes (Signed)
Addendum: Patient had almost completed her maintenance Rituxan chemotherapy; when she developed some mild rigors- mainly around her mouth.  Confirmed that patient had already been given Benadryl 25 mg, Pepcid 20 mg, and Solu-Medrol IV.  Prior to her chemotherapy.  Rituxan infusion was held; the patient was given additional Benadryl 25 mg and Demerol 12.5 mg.  All symptoms did resolve completely; and patient was able to complete her chemotherapy as directed.

## 2015-06-21 ENCOUNTER — Encounter: Payer: Self-pay | Admitting: *Deleted

## 2015-07-04 ENCOUNTER — Other Ambulatory Visit: Payer: Medicare Other

## 2015-07-11 ENCOUNTER — Ambulatory Visit: Payer: Medicare Other

## 2015-07-11 ENCOUNTER — Other Ambulatory Visit: Payer: Medicare Other

## 2015-07-12 ENCOUNTER — Encounter: Payer: Self-pay | Admitting: Internal Medicine

## 2015-07-12 ENCOUNTER — Ambulatory Visit (INDEPENDENT_AMBULATORY_CARE_PROVIDER_SITE_OTHER): Payer: Medicare Other | Admitting: Internal Medicine

## 2015-07-12 VITALS — BP 151/87 | HR 50 | Temp 97.7°F | Wt 141.7 lb

## 2015-07-12 DIAGNOSIS — I129 Hypertensive chronic kidney disease with stage 1 through stage 4 chronic kidney disease, or unspecified chronic kidney disease: Secondary | ICD-10-CM

## 2015-07-12 DIAGNOSIS — E870 Hyperosmolality and hypernatremia: Secondary | ICD-10-CM

## 2015-07-12 DIAGNOSIS — E785 Hyperlipidemia, unspecified: Secondary | ICD-10-CM

## 2015-07-12 DIAGNOSIS — N183 Chronic kidney disease, stage 3 (moderate): Secondary | ICD-10-CM | POA: Diagnosis not present

## 2015-07-12 DIAGNOSIS — I10 Essential (primary) hypertension: Secondary | ICD-10-CM

## 2015-07-12 DIAGNOSIS — Z0000 Encounter for general adult medical examination without abnormal findings: Secondary | ICD-10-CM

## 2015-07-12 NOTE — Patient Instructions (Signed)
-  Your blood pressure is much better! Keep taking lasix and losartan -Please come back for flu shot after September 1st -Please come back in 5 months to recheck your diabetes -Very nice seeing you and so glad you are doing well!  General Instructions:   Please bring your medicines with you each time you come to clinic.  Medicines may include prescription medications, over-the-counter medications, herbal remedies, eye drops, vitamins, or other pills.   Progress Toward Treatment Goals:  Treatment Goal 02/11/2015  Hemoglobin A1C at goal  Blood pressure unchanged    Self Care Goals & Plans:  Self Care Goal 07/12/2015  Manage my medications take my medicines as prescribed; bring my medications to every visit; refill my medications on time; follow the sick day instructions if I am sick  Monitor my health keep track of my weight; check my feet daily  Eat healthy foods eat more vegetables; eat fruit for snacks and desserts; eat baked foods instead of fried foods; eat smaller portions; drink diet soda or water instead of juice or soda  Be physically active find an activity I enjoy  Meeting treatment goals -    Home Blood Glucose Monitoring 02/11/2015  Check my blood sugar no home glucose monitoring     Care Management & Community Referrals:  Referral 10/08/2014  Referrals made for care management support other (see comment)  Referrals made to community resources -

## 2015-07-12 NOTE — Progress Notes (Signed)
Patient ID: [name], female   DOB: 03/08/1921, 81 y.o.   MRN: [id number]    Subjective:   Patient ID: [name] female   DOB: [birth year] 81 y.o.   MRN: [id number]  HPI: [name] is a 81 y.o. very pleasant woman with past medical history of CLL on rituximab and prednisone, non-insulin dependent Type 2 DM, CKD Stage 3, hypertension, hyperlipidemia, right caratoid stenosis, chronic normocytic anemia on aranesp, osteoarthritis, and GERD who presents for follow-up of hypertension.   Her blood pressure was elevated at 182/110 at last visit on 06/07/15. She reports compliance with taking losartan and lasix. She has occasional headache but denies LE edema, chest pain, or lightheadedness. She is on prednisone 10 mg daily for CLL which was started on 02/21/15.    Past Medical History  Diagnosis Date  . Diabetes mellitus   . Hyperlipidemia   . Hypertension   . Carotid stenosis, right 08/2005    40-60%  . History of colonic polyps 2002    Normal colonoscopy 08/2004  . CKD (chronic kidney disease) stage 2, GFR 60-89 ml/min   . CLL (chronic lymphocytic leukemia)     Dr. [name]. s/p Rituxan  . Pulse irregularity     Etiology->frequent PACs, normal EKG   Current Outpatient Prescriptions  Medication Sig Dispense Refill  . acetaminophen (TYLENOL) 500 MG tablet Take 1,000 mg by mouth 2 (two) times daily as needed. For pain    . allopurinol (ZYLOPRIM) 100 MG tablet Take 1 tablet (100 mg total) by mouth daily. 90 tablet 3  . Calcium Carbonate-Vitamin D (CALCIUM PLUS VITAMIN D PO) Take 1 tablet by mouth 2 (two) times daily.    [name] dipyridamole-aspirin (AGGRENOX) 200-25 MG per 12 hr capsule TAKE ONE CAPSULE BY MOUTH TWICE A DAY 200 capsule 2  . erythromycin (ROMYCIN) ophthalmic ointment Place 1 application into both eyes 4 (four) times daily. 3.5 g 0  . furosemide (LASIX) 40 MG tablet Take 1 tablet (40 mg total) by mouth daily. 90 tablet 3  . losartan (COZAAR) 100 MG tablet Take 1  tablet (100 mg total) by mouth daily. 90 tablet 3  . Omega-3 Fatty Acids (FISH OIL) 1200 MG CAPS Take 1 capsule by mouth daily.    [name] omeprazole (PRILOSEC) 20 MG capsule Take 1 capsule (20 mg total) by mouth daily. 90 capsule 3  . Polyethyl Glycol-Propyl Glycol (SYSTANE OP) Place 1 drop into both eyes 2 (two) times daily.    . predniSONE (DELTASONE) 10 MG tablet Take one tablet (10 mg) daily with breakfast 30 tablet 6  . simvastatin (ZOCOR) 40 MG tablet Take 1 tablet (40 mg total) by mouth at bedtime. 90 tablet 3  . traMADol-acetaminophen (ULTRACET) 37.5-325 MG per tablet Take 1-2 tablets by mouth every 6 (six) hours as needed for moderate pain. (Patient not taking: Reported on 06/19/2015) 30 tablet 3   No current facility-administered medications for this visit.   No family history on file. Social History   Social History  . Marital Status: Married    Spouse Name: N/A  . Number of Children: N/A  . Years of Education: N/A   Social History Main Topics  . Smoking status: Former Research scientist (life sciences)  . Smokeless tobacco: None  . Alcohol Use: None  . Drug Use: None  . Sexual Activity: Not Asked   Other Topics Concern  . None   Social History Narrative   Review of Systems: Review of Systems  Eyes: Negative for blurred vision.  Respiratory: Negative for cough, shortness of breath and wheezing.   Cardiovascular: Negative for chest pain and leg swelling.  Gastrointestinal: Positive for heartburn (controlled on PPI therapy). Negative for nausea, vomiting, abdominal pain, diarrhea and constipation.  Genitourinary: Negative for dysuria, urgency and frequency.  Musculoskeletal: Positive for joint pain (OA of multiple joints). Negative for falls.  Skin:       Bruising on left LE  Neurological: Positive for headaches (occasionally ). Negative for dizziness.  Psychiatric/Behavioral: Positive for memory loss (normal aging).     Objective:  Physical Exam: Filed Vitals:   07/12/15 1515  BP: 151/87    Pulse: 50  Temp: 97.7 F (36.5 C)  TempSrc: Oral  Weight: 141 lb 11.2 oz (64.275 kg)  SpO2: 95%    Physical Exam  Constitutional: She is oriented to person, place, and time. She appears well-developed and well-nourished. No distress.  HENT:  Head: Normocephalic and atraumatic.  Eyes: Conjunctivae and EOM are normal. Right eye exhibits no discharge. Left eye exhibits no discharge. No scleral icterus.  Neck: Normal range of motion. Neck supple.  Cardiovascular: Normal rate, regular rhythm and normal heart sounds.   Pulmonary/Chest: Effort normal and breath sounds normal. No respiratory distress. She has no wheezes. She has no rales.  Abdominal: Soft. Bowel sounds are normal. She exhibits no distension. There is no tenderness. There is no rebound and no guarding.  Musculoskeletal: Normal range of motion. She exhibits no edema or tenderness.  Neurological: She is alert and oriented to person, place, and time.  Skin: Skin is warm and dry. No rash noted. She is not diaphoretic. No erythema. No pallor.  Bruise on left LE  Psychiatric: She has a normal mood and affect. Her behavior is normal. Judgment and thought content normal.    Assessment & Plan:   Please see problem list for problem-based assessment and plan

## 2015-07-12 NOTE — Assessment & Plan Note (Addendum)
Assessment: Pt with uncontrolled hypertension compliant with two-class (ARB & diuretic) anti-hypertensive therapy who presents with improved blood pressure of 151/87 from 182/110 at last visit.   Plan:  -BP 151/87 at goal <150/90 (in setting of advanced age) -Continue lasix 40 mg daily and losartan 100 mg daily -Last CMP on 06/07/15 with stable CKD Stage 3 and mild hypernatremia, repeat at next visit

## 2015-07-12 NOTE — Assessment & Plan Note (Signed)
-  Pt to return for annual influenza vaccination (not yet available) -Obtain annual foot exam at next visit  -Pt not candidate for zoster vaccination in setting of immunosuppression

## 2015-07-15 NOTE — Progress Notes (Signed)
Medicine attending: Medical history, presenting problems, physical findings, and medications, reviewed with Dr Marjan Rabbani on the day of the patient visit and I concur with her evaluation and management plan. 

## 2015-07-17 ENCOUNTER — Other Ambulatory Visit (HOSPITAL_BASED_OUTPATIENT_CLINIC_OR_DEPARTMENT_OTHER): Payer: Medicare Other

## 2015-07-17 ENCOUNTER — Ambulatory Visit (HOSPITAL_BASED_OUTPATIENT_CLINIC_OR_DEPARTMENT_OTHER): Payer: Medicare Other

## 2015-07-17 ENCOUNTER — Ambulatory Visit (HOSPITAL_BASED_OUTPATIENT_CLINIC_OR_DEPARTMENT_OTHER): Payer: Medicare Other | Admitting: Nurse Practitioner

## 2015-07-17 VITALS — BP 170/48 | HR 81 | Temp 98.3°F | Resp 20

## 2015-07-17 VITALS — BP 186/55 | HR 73 | Temp 97.7°F | Resp 17 | Ht <= 58 in | Wt 130.9 lb

## 2015-07-17 DIAGNOSIS — D638 Anemia in other chronic diseases classified elsewhere: Secondary | ICD-10-CM

## 2015-07-17 DIAGNOSIS — D631 Anemia in chronic kidney disease: Secondary | ICD-10-CM | POA: Diagnosis not present

## 2015-07-17 DIAGNOSIS — N393 Stress incontinence (female) (male): Secondary | ICD-10-CM | POA: Diagnosis not present

## 2015-07-17 DIAGNOSIS — E119 Type 2 diabetes mellitus without complications: Secondary | ICD-10-CM

## 2015-07-17 DIAGNOSIS — N183 Chronic kidney disease, stage 3 unspecified: Secondary | ICD-10-CM

## 2015-07-17 DIAGNOSIS — M19019 Primary osteoarthritis, unspecified shoulder: Secondary | ICD-10-CM

## 2015-07-17 DIAGNOSIS — Z5112 Encounter for antineoplastic immunotherapy: Secondary | ICD-10-CM

## 2015-07-17 DIAGNOSIS — N189 Chronic kidney disease, unspecified: Secondary | ICD-10-CM

## 2015-07-17 DIAGNOSIS — C9110 Chronic lymphocytic leukemia of B-cell type not having achieved remission: Secondary | ICD-10-CM | POA: Diagnosis present

## 2015-07-17 LAB — CBC & DIFF AND RETIC
BASO%: 0.1 % (ref 0.0–2.0)
Basophils Absolute: 0 10*3/uL (ref 0.0–0.1)
EOS ABS: 0.1 10*3/uL (ref 0.0–0.5)
EOS%: 0.8 % (ref 0.0–7.0)
HCT: 36 % (ref 34.8–46.6)
HEMOGLOBIN: 11.5 g/dL — AB (ref 11.6–15.9)
IMMATURE RETIC FRACT: 5.2 % (ref 1.60–10.00)
LYMPH#: 6.3 10*3/uL — AB (ref 0.9–3.3)
LYMPH%: 41.2 % (ref 14.0–49.7)
MCH: 30.1 pg (ref 25.1–34.0)
MCHC: 31.9 g/dL (ref 31.5–36.0)
MCV: 94.2 fL (ref 79.5–101.0)
MONO#: 1.8 10*3/uL — AB (ref 0.1–0.9)
MONO%: 11.7 % (ref 0.0–14.0)
NEUT%: 46.2 % (ref 38.4–76.8)
NEUTROS ABS: 7.1 10*3/uL — AB (ref 1.5–6.5)
Platelets: 317 10*3/uL (ref 145–400)
RBC: 3.82 10*6/uL (ref 3.70–5.45)
RDW: 16.3 % — ABNORMAL HIGH (ref 11.2–14.5)
RETIC %: 1.8 % (ref 0.70–2.10)
RETIC CT ABS: 68.76 10*3/uL (ref 33.70–90.70)
WBC: 15.4 10*3/uL — AB (ref 3.9–10.3)

## 2015-07-17 LAB — TECHNOLOGIST REVIEW

## 2015-07-17 LAB — FERRITIN CHCC: FERRITIN: 218 ng/mL (ref 9–269)

## 2015-07-17 MED ORDER — METHYLPREDNISOLONE SODIUM SUCC 125 MG IJ SOLR
INTRAMUSCULAR | Status: AC
  Filled 2015-07-17: qty 2

## 2015-07-17 MED ORDER — SODIUM CHLORIDE 0.9 % IV SOLN
100.0000 mg | Freq: Once | INTRAVENOUS | Status: AC
  Administered 2015-07-17: 100 mg via INTRAVENOUS
  Filled 2015-07-17: qty 10

## 2015-07-17 MED ORDER — DIPHENHYDRAMINE HCL 25 MG PO CAPS
25.0000 mg | ORAL_CAPSULE | Freq: Once | ORAL | Status: AC
  Administered 2015-07-17: 25 mg via ORAL

## 2015-07-17 MED ORDER — FAMOTIDINE IN NACL 20-0.9 MG/50ML-% IV SOLN
20.0000 mg | Freq: Two times a day (BID) | INTRAVENOUS | Status: DC
  Administered 2015-07-17: 20 mg via INTRAVENOUS

## 2015-07-17 MED ORDER — SODIUM CHLORIDE 0.9 % IV SOLN
INTRAVENOUS | Status: DC
  Administered 2015-07-17: 14:00:00 via INTRAVENOUS

## 2015-07-17 MED ORDER — DIPHENHYDRAMINE HCL 25 MG PO CAPS
ORAL_CAPSULE | ORAL | Status: AC
  Filled 2015-07-17: qty 1

## 2015-07-17 MED ORDER — ACETAMINOPHEN 325 MG PO TABS
650.0000 mg | ORAL_TABLET | Freq: Once | ORAL | Status: AC
  Administered 2015-07-17: 650 mg via ORAL

## 2015-07-17 MED ORDER — SODIUM CHLORIDE 0.9 % IV SOLN: 100.0000 mg | Freq: Once | INTRAVENOUS | Status: DC

## 2015-07-17 MED ORDER — FAMOTIDINE IN NACL 20-0.9 MG/50ML-% IV SOLN
INTRAVENOUS | Status: AC
  Filled 2015-07-17: qty 50

## 2015-07-17 MED ORDER — ACETAMINOPHEN 325 MG PO TABS
ORAL_TABLET | ORAL | Status: AC
  Filled 2015-07-17: qty 2

## 2015-07-17 MED ORDER — METHYLPREDNISOLONE SODIUM SUCC 125 MG IJ SOLR
125.0000 mg | Freq: Every day | INTRAMUSCULAR | Status: DC
  Administered 2015-07-17: 125 mg via INTRAVENOUS

## 2015-07-17 NOTE — Patient Instructions (Signed)
[facility] Discharge Instructions for Patients Receiving Chemotherapy  Today you received the following chemotherapy agents rituxumab  To help prevent nausea and vomiting after your treatment, we encourage you to take your nausea medication   If you develop nausea and vomiting that is not controlled by your nausea medication, call the clinic.   BELOW ARE SYMPTOMS THAT SHOULD BE REPORTED IMMEDIATELY:  *FEVER GREATER THAN 100.5 F  *CHILLS WITH OR WITHOUT FEVER  NAUSEA AND VOMITING THAT IS NOT CONTROLLED WITH YOUR NAUSEA MEDICATION  *UNUSUAL SHORTNESS OF BREATH  *UNUSUAL BRUISING OR BLEEDING  TENDERNESS IN MOUTH AND THROAT WITH OR WITHOUT PRESENCE OF ULCERS  *URINARY PROBLEMS  *BOWEL PROBLEMS  UNUSUAL RASH Items with * indicate a potential emergency and should be followed up as soon as possible.  Feel free to call the clinic you have any questions or concerns. The clinic phone number is [phone].  Please show the [facility] at check-in to the Emergency Department and triage nurse.

## 2015-07-17 NOTE — Progress Notes (Signed)
Clarified orders with Veleta Miners, NP: Pt is to receive Rituxan today. No Aranesp with HGB 11.5. Pharmacy notified.

## 2015-07-18 ENCOUNTER — Encounter: Payer: Self-pay | Admitting: Nurse Practitioner

## 2015-07-18 NOTE — Progress Notes (Signed)
ID: [name]   DOB: 02/06/1921  MR#: [id number]  CSN#:[id number]  PCP: Tracy Mire, MD GYN: SU:  OTHER MD: [name]  CHIEF COMPLAINT: Chronic lymphoid leukemia, anemia of renal failure  CURRENT TREATMENT: Aranesp,  Low-dose prednisone, rituximab  INTERVAL HISTORY: [name] returns for follow up of her chronic lymphoid leukemia accompanied by her husband [name], and a [name] language interpreter.  She is due for rituximab today. With her last cycle she had some mild rigors and she had to be given extra benadryl and demerol. She was able to complete this infusion by the end of the Hampton, however. The interval history is otherwise unremarkable.  REVIEW OF SYSTEMS: [name] denies fevers, chills, nausea, or vomiting. She moves her bowels well but has some stress urinary incontinence. She has no night sweats, weakness or fatigue. She uses a cane to ambulate and has some arthritis pain. She uses tylenol only for this. She has been sleeping better lately. A detailed review of systems is otherwise stable.  PAST MEDICAL HISTORY: Past Medical History  Diagnosis Date  . Diabetes mellitus   . Hyperlipidemia   . Hypertension   . Carotid stenosis, right 08/2005    40-60%  . History of colonic polyps 2002    Normal colonoscopy 08/2004  . CKD (chronic kidney disease) stage 2, GFR 60-89 ml/min   . CLL (chronic lymphocytic leukemia)     Dr. Jana Hampton. s/p Rituxan  . Pulse irregularity     Etiology->frequent PACs, normal EKG    FAMILY HISTORY No family history on file.  GYNECOLOGIC HISTORY: GX P0  SOCIAL HISTORY: She is originally from [place]. She used to teach [name]. Her husband [name] worked as a Engineer, maintenance (IT). They have no children. They are Catholic.  ADVANCED DIRECTIVES: in place  HEALTH MAINTENANCE: Social History  Substance Use Topics  . Smoking status: Former Research scientist (life sciences)  . Smokeless tobacco: Not on file  . Alcohol Use: Not on file     Colonoscopy: [name]  PAP:  Bone  density:  Lipid panel:  No Known Allergies  Current Outpatient Prescriptions  Medication Sig Dispense Refill  . acetaminophen (TYLENOL) 500 MG tablet Take 1,000 mg by mouth 2 (two) times daily as needed. For pain    . allopurinol (ZYLOPRIM) 100 MG tablet Take 1 tablet (100 mg total) by mouth daily. 90 tablet 3  . Calcium Carbonate-Vitamin D (CALCIUM PLUS VITAMIN D PO) Take 1 tablet by mouth 2 (two) times daily.    [name] dipyridamole-aspirin (AGGRENOX) 200-25 MG per 12 hr capsule TAKE ONE CAPSULE BY MOUTH TWICE A Hampton 200 capsule 2  . furosemide (LASIX) 40 MG tablet Take 1 tablet (40 mg total) by mouth daily. 90 tablet 3  . losartan (COZAAR) 100 MG tablet Take 1 tablet (100 mg total) by mouth daily. 90 tablet 3  . omeprazole (PRILOSEC) 20 MG capsule Take 1 capsule (20 mg total) by mouth daily. 90 capsule 3  . Polyethyl Glycol-Propyl Glycol (SYSTANE OP) Place 1 drop into both eyes 2 (two) times daily.    . predniSONE (DELTASONE) 10 MG tablet Take one tablet (10 mg) daily with breakfast 30 tablet 6  . simvastatin (ZOCOR) 40 MG tablet Take 1 tablet (40 mg total) by mouth at bedtime. 90 tablet 3  . traMADol-acetaminophen (ULTRACET) 37.5-325 MG per tablet Take 1-2 tablets by mouth every 6 (six) hours as needed for moderate pain. 30 tablet 3   No current facility-administered medications for this visit.    OBJECTIVE: Elderly white woman in  no acute distress Filed Vitals:   07/17/15 1152  BP: 186/55  Pulse: 73  Temp: 97.7 F (36.5 C)  Resp: 17     Body mass index is 30.42 kg/(m^2).    ECOG FS: 2  Skin: warm, dry, healing bruise to left shin HEENT: sclerae anicteric, conjunctivae pink, oropharynx clear. No thrush or mucositis.  Lymph Nodes: No cervical or supraclavicular lymphadenopathy  Lungs: clear to auscultation bilaterally, no rales, wheezes, or rhonci  Heart: regular rate and rhythm  Abdomen: round, soft, non tender, positive bowel sounds  Musculoskeletal: No focal spinal tenderness, no  peripheral edema  Neuro: non focal, well oriented, positive affect  Breasts:deferred   LAB RESULTS: Lab Results  Component Value Date   WBC 15.4* 07/17/2015   NEUTROABS 7.1* 07/17/2015   HGB 11.5* 07/17/2015   HCT 36.0 07/17/2015   MCV 94.2 07/17/2015   PLT 317 07/17/2015      Chemistry      Component Value Date/Time   NA 147* 06/07/2015 1550   NA 143 07/10/2014 1447   K 4.5 06/07/2015 1550   K 4.3 07/10/2014 1447   CL 105 06/07/2015 1550   CL 103 04/18/2013 1422   CO2 30 06/07/2015 1550   CO2 22 07/10/2014 1447   BUN 25* 06/07/2015 1550   BUN 52.0* 07/10/2014 1447   CREATININE 1.15* 06/07/2015 1550   CREATININE 2.4* 07/10/2014 1447   CREATININE 1.89* 06/28/2012 1425      Component Value Date/Time   CALCIUM 9.2 06/07/2015 1550   CALCIUM 9.4 07/10/2014 1447   CALCIUM 10.5 08/07/2008 0000   ALKPHOS 91 06/07/2015 1550   ALKPHOS 100 07/10/2014 1447   AST 22 06/07/2015 1550   AST 21 07/10/2014 1447   ALT 12 06/07/2015 1550   ALT 10 07/10/2014 1447   BILITOT 0.5 06/07/2015 1550   BILITOT 0.27 07/10/2014 1447       No results found for: LABCA2  No components found for: [id number]  No results for input(s): INR in the last 168 hours.  Urinalysis No results found for: COLORURINE Results for Tracy, Hampton (MRN [id number]) as of 06/19/2015 10:58  Ref. Range 03/28/2015 11:19 04/04/2015 11:26 05/16/2015 11:59 06/13/2015 10:59 06/19/2015 09:03  lymph# Latest Ref Range: 0.9-3.3 10e3/uL 3.3 5.1 (H) 4.5 (H) 12.6 (H) 17.0 (H)    STUDIES: No results found.   ASSESSMENT: 81 y.o.  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  Status post treatment with Rituxan in 2004 and 2007, with no treatment after November 2007.   (2) anemia, possibly due to CLL progression but certainly worsened by chronic renal insufficiency     (a) aranesp started 02/07/2015, to  be repeated every 28 days  (b) feraheme to be given whenever ferritin falls to <100  (3) rituximab started 02/14/2015, repeated weekly x7 at lod dose with excellent response  (a) allopurinol started 02/07/2015  (b) hepatitis serologies drawn 02/11/2015 negative  (c) rituximab maintenance to start 06/03/2015, to be repeated every 8 weeks (on hold. Will give every 4 weeks for now because of increasing lymph#)  PLAN:  Maxx continues to manage treatment well. The labs were reviewed in detail and her lymphocyte count is down to 6.3 today. She will proceed with her next cycle of rituxan as planned today. Her hgb is 11.5 and her ferritin is normal. We will continue to check a CBC monthly, administer aranesp when appropriate, and feraheme  should her ferritin drop below 100.   Gabriel will return in 4 weeks for her next rixtuxan infusion. In 8 weeks she will have a follow up visit with Dr. Jana Hampton. During that visit he will decide if it is safe to begin infusions every 8 weeks as originally planned. She understands and agrees with this plan. She knows the goal of treatment in her case is control. She has been encouraged to call with any issues that might arise before her next visit here.   Genelle Gather Marcella Dunnaway    07/18/2015

## 2015-08-08 ENCOUNTER — Ambulatory Visit (HOSPITAL_BASED_OUTPATIENT_CLINIC_OR_DEPARTMENT_OTHER): Payer: Medicare Other

## 2015-08-08 ENCOUNTER — Other Ambulatory Visit: Payer: Self-pay | Admitting: Oncology

## 2015-08-08 ENCOUNTER — Other Ambulatory Visit: Payer: Self-pay | Admitting: *Deleted

## 2015-08-08 ENCOUNTER — Other Ambulatory Visit (HOSPITAL_BASED_OUTPATIENT_CLINIC_OR_DEPARTMENT_OTHER): Payer: Medicare Other

## 2015-08-08 ENCOUNTER — Ambulatory Visit: Payer: Medicare Other

## 2015-08-08 VITALS — BP 138/68 | HR 87 | Temp 99.3°F | Resp 22

## 2015-08-08 DIAGNOSIS — N182 Chronic kidney disease, stage 2 (mild): Secondary | ICD-10-CM

## 2015-08-08 DIAGNOSIS — N189 Chronic kidney disease, unspecified: Principal | ICD-10-CM

## 2015-08-08 DIAGNOSIS — N183 Chronic kidney disease, stage 3 unspecified: Secondary | ICD-10-CM

## 2015-08-08 DIAGNOSIS — D631 Anemia in chronic kidney disease: Secondary | ICD-10-CM | POA: Diagnosis not present

## 2015-08-08 DIAGNOSIS — Z5112 Encounter for antineoplastic immunotherapy: Secondary | ICD-10-CM | POA: Diagnosis present

## 2015-08-08 DIAGNOSIS — M19019 Primary osteoarthritis, unspecified shoulder: Secondary | ICD-10-CM

## 2015-08-08 DIAGNOSIS — C9110 Chronic lymphocytic leukemia of B-cell type not having achieved remission: Secondary | ICD-10-CM

## 2015-08-08 DIAGNOSIS — D638 Anemia in other chronic diseases classified elsewhere: Secondary | ICD-10-CM

## 2015-08-08 DIAGNOSIS — E119 Type 2 diabetes mellitus without complications: Secondary | ICD-10-CM

## 2015-08-08 LAB — CBC & DIFF AND RETIC
BASO%: 0.9 % (ref 0.0–2.0)
Basophils Absolute: 0.1 10*3/uL (ref 0.0–0.1)
EOS%: 0.9 % (ref 0.0–7.0)
Eosinophils Absolute: 0.2 10*3/uL (ref 0.0–0.5)
HCT: 33.8 % — ABNORMAL LOW (ref 34.8–46.6)
HGB: 11.1 g/dL — ABNORMAL LOW (ref 11.6–15.9)
IMMATURE RETIC FRACT: 6 % (ref 1.60–10.00)
LYMPH#: 6.8 10*3/uL — AB (ref 0.9–3.3)
LYMPH%: 39.5 % (ref 14.0–49.7)
MCH: 30.3 pg (ref 25.1–34.0)
MCHC: 32.7 g/dL (ref 31.5–36.0)
MCV: 92.7 fL (ref 79.5–101.0)
MONO#: 1.2 10*3/uL — AB (ref 0.1–0.9)
MONO%: 7 % (ref 0.0–14.0)
NEUT%: 51.7 % (ref 38.4–76.8)
NEUTROS ABS: 8.9 10*3/uL — AB (ref 1.5–6.5)
Platelets: 450 10*3/uL — ABNORMAL HIGH (ref 145–400)
RBC: 3.64 10*6/uL — AB (ref 3.70–5.45)
RDW: 16.1 % — AB (ref 11.2–14.5)
RETIC %: 1.68 % (ref 0.70–2.10)
RETIC CT ABS: 61.15 10*3/uL (ref 33.70–90.70)
WBC: 17.3 10*3/uL — ABNORMAL HIGH (ref 3.9–10.3)

## 2015-08-08 LAB — COMPREHENSIVE METABOLIC PANEL (CC13)
ALBUMIN: 3.9 g/dL (ref 3.5–5.0)
ALK PHOS: 100 U/L (ref 40–150)
ALT: 23 U/L (ref 0–55)
AST: 21 U/L (ref 5–34)
Anion Gap: 13 mEq/L — ABNORMAL HIGH (ref 3–11)
BUN: 42.4 mg/dL — AB (ref 7.0–26.0)
CALCIUM: 8.5 mg/dL (ref 8.4–10.4)
CO2: 26 mEq/L (ref 22–29)
CREATININE: 1.6 mg/dL — AB (ref 0.6–1.1)
Chloride: 105 mEq/L (ref 98–109)
EGFR: 27 mL/min/{1.73_m2} — ABNORMAL LOW (ref >90)
GLUCOSE: 167 mg/dL — AB (ref 70–140)
POTASSIUM: 3.8 meq/L (ref 3.5–5.1)
SODIUM: 144 meq/L (ref 136–145)
Total Bilirubin: 0.61 mg/dL (ref 0.20–1.20)
Total Protein: 6 g/dL — ABNORMAL LOW (ref 6.4–8.3)

## 2015-08-08 LAB — TECHNOLOGIST REVIEW

## 2015-08-08 LAB — FERRITIN CHCC: Ferritin: 197 ng/ml (ref 9–269)

## 2015-08-08 MED ORDER — DIPHENHYDRAMINE HCL 25 MG PO CAPS
25.0000 mg | ORAL_CAPSULE | Freq: Once | ORAL | Status: AC
  Administered 2015-08-08: 25 mg via ORAL

## 2015-08-08 MED ORDER — DARBEPOETIN ALFA 200 MCG/0.4ML IJ SOSY: 200.0000 ug | PREFILLED_SYRINGE | Freq: Once | INTRAMUSCULAR | Status: DC

## 2015-08-08 MED ORDER — FAMOTIDINE IN NACL 20-0.9 MG/50ML-% IV SOLN
INTRAVENOUS | Status: AC
Start: 2015-08-08 — End: 2015-08-08
  Filled 2015-08-08: qty 50

## 2015-08-08 MED ORDER — METHYLPREDNISOLONE SODIUM SUCC 125 MG IJ SOLR
125.0000 mg | Freq: Every day | INTRAMUSCULAR | Status: DC
  Administered 2015-08-08: 125 mg via INTRAVENOUS

## 2015-08-08 MED ORDER — METHYLPREDNISOLONE SODIUM SUCC 125 MG IJ SOLR
INTRAMUSCULAR | Status: AC
  Filled 2015-08-08: qty 2

## 2015-08-08 MED ORDER — DIPHENHYDRAMINE HCL 25 MG PO CAPS
ORAL_CAPSULE | ORAL | Status: AC
  Filled 2015-08-08: qty 1

## 2015-08-08 MED ORDER — ACETAMINOPHEN 325 MG PO TABS
650.0000 mg | ORAL_TABLET | Freq: Once | ORAL | Status: AC
  Administered 2015-08-08: 650 mg via ORAL

## 2015-08-08 MED ORDER — SODIUM CHLORIDE 0.9 % IV SOLN
100.0000 mg | Freq: Once | INTRAVENOUS | Status: AC
  Administered 2015-08-08: 100 mg via INTRAVENOUS
  Filled 2015-08-08: qty 10

## 2015-08-08 MED ORDER — SODIUM CHLORIDE 0.9 % IV SOLN
INTRAVENOUS | Status: DC
  Administered 2015-08-08: 13:00:00 via INTRAVENOUS

## 2015-08-08 MED ORDER — ACETAMINOPHEN 325 MG PO TABS
ORAL_TABLET | ORAL | Status: AC
  Filled 2015-08-08: qty 2

## 2015-08-08 MED ORDER — FAMOTIDINE IN NACL 20-0.9 MG/50ML-% IV SOLN
20.0000 mg | Freq: Two times a day (BID) | INTRAVENOUS | Status: DC
  Administered 2015-08-08: 20 mg via INTRAVENOUS

## 2015-08-08 NOTE — Patient Instructions (Signed)
[facility] Discharge Instructions for Patients Receiving Chemotherapy  Today you received the following chemotherapy agents: rituxan  To help prevent nausea and vomiting after your treatment, we encourage you to take your nausea medication.  Take it as often as prescribed.     If you develop nausea and vomiting that is not controlled by your nausea medication, call the clinic. If it is after clinic hours your family physician or the after hours number for the clinic or go to the Emergency Department.   BELOW ARE SYMPTOMS THAT SHOULD BE REPORTED IMMEDIATELY:  *FEVER GREATER THAN 100.5 F  *CHILLS WITH OR WITHOUT FEVER  NAUSEA AND VOMITING THAT IS NOT CONTROLLED WITH YOUR NAUSEA MEDICATION  *UNUSUAL SHORTNESS OF BREATH  *UNUSUAL BRUISING OR BLEEDING  TENDERNESS IN MOUTH AND THROAT WITH OR WITHOUT PRESENCE OF ULCERS  *URINARY PROBLEMS  *BOWEL PROBLEMS  UNUSUAL RASH Items with * indicate a potential emergency and should be followed up as soon as possible.  Feel free to call the clinic you have any questions or concerns. The clinic phone number is [phone].   I have been informed and understand all the instructions given to me. I know to contact the clinic, my physician, or go to the Emergency Department if any problems should occur. I do not have any questions at this time, but understand that I may call the clinic during office hours   should I have any questions or need assistance in obtaining follow up care.    __________________________________________  _____________  __________ Signature of Patient or Authorized Representative            Date                   Time    __________________________________________ Nurse's Signature    

## 2015-09-04 ENCOUNTER — Other Ambulatory Visit: Payer: Self-pay

## 2015-09-04 DIAGNOSIS — C9110 Chronic lymphocytic leukemia of B-cell type not having achieved remission: Secondary | ICD-10-CM

## 2015-09-05 ENCOUNTER — Ambulatory Visit (HOSPITAL_BASED_OUTPATIENT_CLINIC_OR_DEPARTMENT_OTHER): Payer: Medicare Other

## 2015-09-05 ENCOUNTER — Other Ambulatory Visit (HOSPITAL_BASED_OUTPATIENT_CLINIC_OR_DEPARTMENT_OTHER): Payer: Medicare Other

## 2015-09-05 ENCOUNTER — Other Ambulatory Visit: Payer: Self-pay | Admitting: Nurse Practitioner

## 2015-09-05 ENCOUNTER — Telehealth: Payer: Self-pay | Admitting: *Deleted

## 2015-09-05 ENCOUNTER — Ambulatory Visit (HOSPITAL_BASED_OUTPATIENT_CLINIC_OR_DEPARTMENT_OTHER): Payer: Medicare Other | Admitting: Oncology

## 2015-09-05 ENCOUNTER — Other Ambulatory Visit: Payer: Self-pay

## 2015-09-05 ENCOUNTER — Telehealth: Payer: Self-pay | Admitting: Oncology

## 2015-09-05 VITALS — BP 163/53 | HR 80 | Temp 98.0°F | Resp 18 | Ht <= 58 in | Wt 139.4 lb

## 2015-09-05 DIAGNOSIS — N189 Chronic kidney disease, unspecified: Secondary | ICD-10-CM | POA: Diagnosis not present

## 2015-09-05 DIAGNOSIS — D631 Anemia in chronic kidney disease: Secondary | ICD-10-CM

## 2015-09-05 DIAGNOSIS — N183 Chronic kidney disease, stage 3 unspecified: Secondary | ICD-10-CM

## 2015-09-05 DIAGNOSIS — C9110 Chronic lymphocytic leukemia of B-cell type not having achieved remission: Secondary | ICD-10-CM

## 2015-09-05 LAB — CBC WITH DIFFERENTIAL/PLATELET
BASO%: 1.1 % (ref 0.0–2.0)
BASOS ABS: 0.1 10*3/uL (ref 0.0–0.1)
EOS ABS: 0.2 10*3/uL (ref 0.0–0.5)
EOS%: 1.1 % (ref 0.0–7.0)
HCT: 33.1 % — ABNORMAL LOW (ref 34.8–46.6)
HGB: 10.9 g/dL — ABNORMAL LOW (ref 11.6–15.9)
LYMPH%: 31.5 % (ref 14.0–49.7)
MCH: 31.2 pg (ref 25.1–34.0)
MCHC: 32.8 g/dL (ref 31.5–36.0)
MCV: 94.9 fL (ref 79.5–101.0)
MONO#: 1.2 10*3/uL — AB (ref 0.1–0.9)
MONO%: 9 % (ref 0.0–14.0)
NEUT%: 57.3 % (ref 38.4–76.8)
NEUTROS ABS: 7.8 10*3/uL — AB (ref 1.5–6.5)
PLATELETS: 394 10*3/uL (ref 145–400)
RBC: 3.49 10*6/uL — AB (ref 3.70–5.45)
RDW: 15.8 % — ABNORMAL HIGH (ref 11.2–14.5)
WBC: 13.7 10*3/uL — AB (ref 3.9–10.3)
lymph#: 4.3 10*3/uL — ABNORMAL HIGH (ref 0.9–3.3)

## 2015-09-05 LAB — COMPREHENSIVE METABOLIC PANEL (CC13)
ALT: 12 U/L (ref 0–55)
ANION GAP: 12 meq/L — AB (ref 3–11)
AST: 20 U/L (ref 5–34)
Albumin: 3.9 g/dL (ref 3.5–5.0)
Alkaline Phosphatase: 94 U/L (ref 40–150)
BUN: 38.5 mg/dL — ABNORMAL HIGH (ref 7.0–26.0)
CO2: 23 meq/L (ref 22–29)
Calcium: 8.2 mg/dL — ABNORMAL LOW (ref 8.4–10.4)
Chloride: 110 mEq/L — ABNORMAL HIGH (ref 98–109)
Creatinine: 1.7 mg/dL — ABNORMAL HIGH (ref 0.6–1.1)
EGFR: 26 mL/min/{1.73_m2} — AB (ref >90)
GLUCOSE: 201 mg/dL — AB (ref 70–140)
POTASSIUM: 3.5 meq/L (ref 3.5–5.1)
SODIUM: 145 meq/L (ref 136–145)
Total Bilirubin: 0.46 mg/dL (ref 0.20–1.20)
Total Protein: 6.4 g/dL (ref 6.4–8.3)

## 2015-09-05 LAB — FERRITIN CHCC: Ferritin: 163 ng/ml (ref 9–269)

## 2015-09-05 MED ORDER — PREDNISONE 5 MG PO TABS: ORAL_TABLET | ORAL | Status: DC

## 2015-09-05 MED ORDER — DARBEPOETIN ALFA 300 MCG/0.6ML IJ SOSY
300.0000 ug | PREFILLED_SYRINGE | Freq: Once | INTRAMUSCULAR | Status: AC
  Administered 2015-09-05: 300 ug via SUBCUTANEOUS
  Filled 2015-09-05: qty 0.6

## 2015-09-05 NOTE — Telephone Encounter (Signed)
Appointments made and avs pritned for patient °

## 2015-09-05 NOTE — Progress Notes (Signed)
ID: [name]   DOB: 05-23-1921  MR#: [id number]  CSN#:[id number]  PCP: Juluis Mire, MD GYN: SU:  OTHER MD: Susa Day  CHIEF COMPLAINT: Chronic lymphoid leukemia, anemia of renal failure  CURRENT TREATMENT: Aranesp,  Low-dose prednisone, rituximab  INTERVAL HISTORY: [name] returns for follow up of her chronic lymphoid leukemia accompanied by her husband [name].  They also came with [place] interpreter. [name] appears to be tolerating her right toxin better, with only minimal riders lasts time. She reports no other side effects from that treatment. She is also receiving Aranesp on a monthly basis depending on her hemoglobin of course. That is going well also.   [name] is feeling more tired, he says, but otherwise he is also  stable  REVIEW OF SYSTEMS: [name]  Tells me at home she uses a cane but when she goes out she gets a wheelchair because its quicker. There have been no falls. She denies fever, rash, or bleeding problems. There have been no unusual headaches. She denies any visual changes. There has been no nausea, vomiting, cough, phlegm production, or change in bowel or bladder habits. A detailed review of systems was otherwise stable  PAST MEDICAL HISTORY: Past Medical History  Diagnosis Date  . Diabetes mellitus   . Hyperlipidemia   . Hypertension   . Carotid stenosis, right 08/2005    40-60%  . History of colonic polyps 2002    Normal colonoscopy 08/2004  . CKD (chronic kidney disease) stage 2, GFR 60-89 ml/min   . CLL (chronic lymphocytic leukemia)     Dr. [name]. s/p Rituxan  . Pulse irregularity     Etiology->frequent PACs, normal EKG    FAMILY HISTORY No family history on file.  GYNECOLOGIC HISTORY: GX P0  SOCIAL HISTORY: She is originally from [place]. 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 They have no children. They are Catholic.  ADVANCED DIRECTIVES: in place  HEALTH MAINTENANCE: Social History  Substance Use Topics  .  Smoking status: Former Research scientist (life sciences)  . Smokeless tobacco: Not on file  . Alcohol Use: Not on file     Colonoscopy: Buccini  PAP:  Bone density:  Lipid panel:  No Known Allergies  Current Outpatient Prescriptions  Medication Sig Dispense Refill  . acetaminophen (TYLENOL) 500 MG tablet Take 1,000 mg by mouth 2 (two) times daily as needed. For pain    . allopurinol (ZYLOPRIM) 100 MG tablet Take 1 tablet (100 mg total) by mouth daily. 90 tablet 3  . Calcium Carbonate-Vitamin D (CALCIUM PLUS VITAMIN D PO) Take 1 tablet by mouth 2 (two) times daily.    [name] dipyridamole-aspirin (AGGRENOX) 200-25 MG per 12 hr capsule TAKE ONE CAPSULE BY MOUTH TWICE A DAY 200 capsule 2  . furosemide (LASIX) 40 MG tablet Take 1 tablet (40 mg total) by mouth daily. 90 tablet 3  . losartan (COZAAR) 100 MG tablet Take 1 tablet (100 mg total) by mouth daily. 90 tablet 3  . omeprazole (PRILOSEC) 20 MG capsule Take 1 capsule (20 mg total) by mouth daily. 90 capsule 3  . Polyethyl Glycol-Propyl Glycol (SYSTANE OP) Place 1 drop into both eyes 2 (two) times daily.    . predniSONE (DELTASONE) 10 MG tablet Take one tablet (10 mg) daily with breakfast 30 tablet 6  . simvastatin (ZOCOR) 40 MG tablet Take 1 tablet (40 mg total) by mouth at bedtime. 90 tablet 3  . traMADol-acetaminophen (ULTRACET) 37.5-325 MG per tablet Take 1-2 tablets by mouth every  6 (six) hours as needed for moderate pain. 30 tablet 3   No current facility-administered medications for this visit.    OBJECTIVE: Elderly white woman examination and a wheelchair  Filed Vitals:   09/05/15 1220  BP: 163/53  Pulse: 80  Temp: 98 F (36.7 C)  Resp: 18     Body mass index is 32.4 kg/(m^2).    ECOG FS: 2  Sclerae unicteric, EOMs intact Oropharynx clear,  No thrush or other lesions No cervical or supraclavicular adenopathy Lungs no rales or rhonchi Heart regular rate and rhythm Abd soft, nontender, positive bowel sounds MSK no focal spinal tenderness, no upper  extremity lymphedema Neuro: nonfocal, well oriented, appropriate affect Breasts:  deferred    LAB RESULTS: Lab Results  Component Value Date   WBC 13.7* 09/05/2015   NEUTROABS 7.8* 09/05/2015   HGB 10.9* 09/05/2015   HCT 33.1* 09/05/2015   MCV 94.9 09/05/2015   PLT 394 09/05/2015      Chemistry      Component Value Date/Time   NA 144 08/08/2015 1134   NA 147* 06/07/2015 1550   K 3.8 08/08/2015 1134   K 4.5 06/07/2015 1550   CL 105 06/07/2015 1550   CL 103 04/18/2013 1422   CO2 26 08/08/2015 1134   CO2 30 06/07/2015 1550   BUN 42.4* 08/08/2015 1134   BUN 25* 06/07/2015 1550   CREATININE 1.6* 08/08/2015 1134   CREATININE 1.15* 06/07/2015 1550   CREATININE 1.89* 06/28/2012 1425      Component Value Date/Time   CALCIUM 8.5 08/08/2015 1134   CALCIUM 9.2 06/07/2015 1550   CALCIUM 10.5 08/07/2008 0000   ALKPHOS 100 08/08/2015 1134   ALKPHOS 91 06/07/2015 1550   AST 21 08/08/2015 1134   AST 22 06/07/2015 1550   ALT 23 08/08/2015 1134   ALT 12 06/07/2015 1550   BILITOT 0.61 08/08/2015 1134   BILITOT 0.5 06/07/2015 1550       No results found for: LABCA2  No components found for: [id number]  No results for input(s): INR in the last 168 hours.  Urinalysis No results found for: COLORURINE Results for KAYCIE, PEGUES (MRN [id number]) as of 09/05/2015 12:27  Ref. Range 06/13/2015 10:59 06/19/2015 09:03 07/17/2015 10:45 08/08/2015 11:37 09/05/2015 11:53  lymph# Latest Ref Range: 0.9-3.3 10e3/uL 12.6 (H) 17.0 (H) 6.3 (H) 6.8 (H) 4.3 (H)  Results for KLARISSA, MCILVAIN (MRN [id number]) as of 09/05/2015 12:27  Ref. Range 06/13/2015 10:59 06/19/2015 09:03 07/17/2015 10:45 08/08/2015 11:37 09/05/2015 11:53  Hemoglobin Latest Ref Range: 11.6-15.9 g/dL 12.3 12.1 11.5 (L) 11.1 (L) 10.9 (L)    STUDIES: No results found.   ASSESSMENT: 81 y.o.  [place] woman originally from [place]   (1) with a history of chronic lymphoid leukemia/well differentiated lymphoid lymphoma, originally  diagnosed in November 2001.  Status post treatment with Rituxan in 2004 and 2007, then as below  (2) anemia, possibly due to CLL progression but certainly worsened by chronic renal insufficiency     (a) aranesp started 02/07/2015, to be repeated every 28 days  (b) feraheme to be given whenever ferritin falls to <100  (3) rituximab started 02/14/2015, repeated weekly x7 at lod dose with excellent response  (a) allopurinol started 02/07/2015  (b) hepatitis serologies drawn 02/11/2015, negative  (c) rituximab low-dose maintenance started 06/03/2015, repeated every 4 weeks PLAN:  Delaney is doing very well as far as her chronic lymphoid leukemia and chronic anemia are concerned. I'm going to up the dose of Rituxan to  200 mg total now that she is tolerating things better. I'm also hoping the Aranesp the little bit to see if we can keep her hemoglobin above 11 more consistently.    she was supposed to have been scheduled for treatment today but for some reason that was not done. She is getting her Aranesp and we will try to see if she can get her Rituxan today as well.    otherwise the plan is to continue the current treatment which is monthly right toxin and Aranesp and every 2 month visits pretty much indefinitely    Ashly Yepez C    09/05/2015

## 2015-09-05 NOTE — Telephone Encounter (Signed)
Per staff phone call and POF I have schedueld appts. Scheduler advised of appts.  JMW  

## 2015-09-06 ENCOUNTER — Other Ambulatory Visit: Payer: Self-pay | Admitting: Oncology

## 2015-09-06 DIAGNOSIS — C9110 Chronic lymphocytic leukemia of B-cell type not having achieved remission: Secondary | ICD-10-CM

## 2015-09-12 ENCOUNTER — Ambulatory Visit: Payer: Medicare Other | Admitting: Oncology

## 2015-09-13 ENCOUNTER — Ambulatory Visit (HOSPITAL_BASED_OUTPATIENT_CLINIC_OR_DEPARTMENT_OTHER): Payer: Medicare Other

## 2015-09-13 ENCOUNTER — Other Ambulatory Visit (HOSPITAL_BASED_OUTPATIENT_CLINIC_OR_DEPARTMENT_OTHER): Payer: Medicare Other

## 2015-09-13 VITALS — BP 142/47 | HR 70 | Temp 98.6°F | Resp 20

## 2015-09-13 DIAGNOSIS — C9110 Chronic lymphocytic leukemia of B-cell type not having achieved remission: Secondary | ICD-10-CM

## 2015-09-13 DIAGNOSIS — Z5112 Encounter for antineoplastic immunotherapy: Secondary | ICD-10-CM

## 2015-09-13 LAB — CBC & DIFF AND RETIC
BASO%: 1.4 % (ref 0.0–2.0)
Basophils Absolute: 0.3 10*3/uL — ABNORMAL HIGH (ref 0.0–0.1)
EOS ABS: 0.1 10*3/uL (ref 0.0–0.5)
EOS%: 0.5 % (ref 0.0–7.0)
HCT: 38.7 % (ref 34.8–46.6)
HGB: 12.2 g/dL (ref 11.6–15.9)
IMMATURE RETIC FRACT: 26.1 % — AB (ref 1.60–10.00)
LYMPH%: 47.1 % (ref 14.0–49.7)
MCH: 30.6 pg (ref 25.1–34.0)
MCHC: 31.5 g/dL (ref 31.5–36.0)
MCV: 97.2 fL (ref 79.5–101.0)
MONO#: 1 10*3/uL — AB (ref 0.1–0.9)
MONO%: 4.1 % (ref 0.0–14.0)
NEUT%: 46.9 % (ref 38.4–76.8)
NEUTROS ABS: 11.5 10*3/uL — AB (ref 1.5–6.5)
Platelets: 480 10*3/uL — ABNORMAL HIGH (ref 145–400)
RBC: 3.98 10*6/uL (ref 3.70–5.45)
RDW: 16.4 % — AB (ref 11.2–14.5)
RETIC %: 6.21 % — AB (ref 0.70–2.10)
Retic Ct Abs: 247.16 10*3/uL — ABNORMAL HIGH (ref 33.70–90.70)
WBC: 24.5 10*3/uL — AB (ref 3.9–10.3)
lymph#: 11.5 10*3/uL — ABNORMAL HIGH (ref 0.9–3.3)

## 2015-09-13 LAB — COMPREHENSIVE METABOLIC PANEL (CC13)
ALT: 19 U/L (ref 0–55)
AST: 21 U/L (ref 5–34)
Albumin: 4.2 g/dL (ref 3.5–5.0)
Alkaline Phosphatase: 95 U/L (ref 40–150)
Anion Gap: 13 mEq/L — ABNORMAL HIGH (ref 3–11)
BUN: 37.8 mg/dL — AB (ref 7.0–26.0)
CHLORIDE: 104 meq/L (ref 98–109)
CO2: 25 meq/L (ref 22–29)
Calcium: 8.7 mg/dL (ref 8.4–10.4)
Creatinine: 1.6 mg/dL — ABNORMAL HIGH (ref 0.6–1.1)
EGFR: 27 mL/min/{1.73_m2} — AB (ref >90)
GLUCOSE: 236 mg/dL — AB (ref 70–140)
POTASSIUM: 3.6 meq/L (ref 3.5–5.1)
SODIUM: 142 meq/L (ref 136–145)
Total Bilirubin: 0.51 mg/dL (ref 0.20–1.20)
Total Protein: 6.6 g/dL (ref 6.4–8.3)

## 2015-09-13 LAB — TECHNOLOGIST REVIEW

## 2015-09-13 MED ORDER — METHYLPREDNISOLONE SODIUM SUCC 40 MG IJ SOLR
40.0000 mg | Freq: Every day | INTRAMUSCULAR | Status: DC
  Administered 2015-09-13: 40 mg via INTRAVENOUS

## 2015-09-13 MED ORDER — FAMOTIDINE IN NACL 20-0.9 MG/50ML-% IV SOLN
20.0000 mg | Freq: Two times a day (BID) | INTRAVENOUS | Status: DC
  Administered 2015-09-13: 20 mg via INTRAVENOUS

## 2015-09-13 MED ORDER — FAMOTIDINE IN NACL 20-0.9 MG/50ML-% IV SOLN
INTRAVENOUS | Status: AC
  Filled 2015-09-13: qty 50

## 2015-09-13 MED ORDER — ACETAMINOPHEN 325 MG PO TABS
ORAL_TABLET | ORAL | Status: AC
Start: 2015-09-13 — End: 2015-09-13
  Filled 2015-09-13: qty 2

## 2015-09-13 MED ORDER — SODIUM CHLORIDE 0.9 % IV SOLN
INTRAVENOUS | Status: DC
  Administered 2015-09-13: 11:00:00 via INTRAVENOUS

## 2015-09-13 MED ORDER — SODIUM CHLORIDE 0.9 % IV SOLN
1000.0000 mL | INTRAVENOUS | Status: DC | PRN
  Administered 2015-09-13: 13:00:00 via INTRAVENOUS

## 2015-09-13 MED ORDER — METHYLPREDNISOLONE SODIUM SUCC 40 MG IJ SOLR
INTRAMUSCULAR | Status: AC
  Filled 2015-09-13: qty 1

## 2015-09-13 MED ORDER — ACETAMINOPHEN 325 MG PO TABS
650.0000 mg | ORAL_TABLET | Freq: Once | ORAL | Status: AC
  Administered 2015-09-13: 650 mg via ORAL

## 2015-09-13 MED ORDER — DIPHENHYDRAMINE HCL 25 MG PO CAPS
ORAL_CAPSULE | ORAL | Status: AC
  Filled 2015-09-13: qty 1

## 2015-09-13 MED ORDER — DIPHENHYDRAMINE HCL 25 MG PO CAPS
25.0000 mg | ORAL_CAPSULE | Freq: Once | ORAL | Status: AC
  Administered 2015-09-13: 25 mg via ORAL

## 2015-09-13 MED ORDER — SODIUM CHLORIDE 0.9 % IV SOLN
200.0000 mg | Freq: Once | INTRAVENOUS | Status: AC
  Administered 2015-09-13: 200 mg via INTRAVENOUS
  Filled 2015-09-13: qty 20

## 2015-09-13 MED ORDER — RITUXIMAB CHEMO INJECTION 500 MG/50ML
200.0000 mg | Freq: Once | INTRAVENOUS | Status: DC
  Filled 2015-09-13: qty 20

## 2015-09-13 NOTE — Progress Notes (Signed)
1235 BP 95/36 P-76; pt no c/o voiced NS 578ml started @ 120ml with Rituxan at beginning rate of 135cc/hr x 28ml.   1245 BP back up to 110/41; no c/o from pt; eating/drinking w/o problems.

## 2015-09-13 NOTE — Patient Instructions (Signed)
[facility] Discharge Instructions for Patients Receiving Chemotherapy  Today you received the following chemotherapy agents Rituxan  To help prevent nausea and vomiting after your treatment, we encourage you to take your nausea medication None ordered--Call MD if needed.  If you develop nausea and vomiting that is not controlled by your nausea medication, call the clinic.   BELOW ARE SYMPTOMS THAT SHOULD BE REPORTED IMMEDIATELY:  *FEVER GREATER THAN 100.5 F  *CHILLS WITH OR WITHOUT FEVER  NAUSEA AND VOMITING THAT IS NOT CONTROLLED WITH YOUR NAUSEA MEDICATION  *UNUSUAL SHORTNESS OF BREATH  *UNUSUAL BRUISING OR BLEEDING  TENDERNESS IN MOUTH AND THROAT WITH OR WITHOUT PRESENCE OF ULCERS  *URINARY PROBLEMS  *BOWEL PROBLEMS  UNUSUAL RASH Items with * indicate a potential emergency and should be followed up as soon as possible.  Feel free to call the clinic you have any questions or concerns. The clinic phone number is [phone].  Please show the [facility] at check-in to the Emergency Department and triage nurse.

## 2015-09-30 ENCOUNTER — Other Ambulatory Visit: Payer: Self-pay | Admitting: Nurse Practitioner

## 2015-10-10 ENCOUNTER — Ambulatory Visit (HOSPITAL_BASED_OUTPATIENT_CLINIC_OR_DEPARTMENT_OTHER): Payer: Medicare Other

## 2015-10-10 ENCOUNTER — Other Ambulatory Visit: Payer: Self-pay | Admitting: Oncology

## 2015-10-10 ENCOUNTER — Other Ambulatory Visit (HOSPITAL_BASED_OUTPATIENT_CLINIC_OR_DEPARTMENT_OTHER): Payer: Medicare Other

## 2015-10-10 ENCOUNTER — Ambulatory Visit (HOSPITAL_BASED_OUTPATIENT_CLINIC_OR_DEPARTMENT_OTHER): Payer: Medicare Other | Admitting: Nurse Practitioner

## 2015-10-10 VITALS — BP 132/47 | HR 93 | Temp 98.6°F | Resp 18

## 2015-10-10 DIAGNOSIS — T7840XA Allergy, unspecified, initial encounter: Secondary | ICD-10-CM

## 2015-10-10 DIAGNOSIS — Z5112 Encounter for antineoplastic immunotherapy: Secondary | ICD-10-CM

## 2015-10-10 DIAGNOSIS — R42 Dizziness and giddiness: Secondary | ICD-10-CM | POA: Diagnosis not present

## 2015-10-10 DIAGNOSIS — C9110 Chronic lymphocytic leukemia of B-cell type not having achieved remission: Secondary | ICD-10-CM | POA: Diagnosis present

## 2015-10-10 DIAGNOSIS — R110 Nausea: Secondary | ICD-10-CM

## 2015-10-10 LAB — CBC & DIFF AND RETIC
BASO%: 0.6 % (ref 0.0–2.0)
BASOS ABS: 0.1 10*3/uL (ref 0.0–0.1)
EOS%: 0.4 % (ref 0.0–7.0)
Eosinophils Absolute: 0.1 10*3/uL (ref 0.0–0.5)
HEMATOCRIT: 39.1 % (ref 34.8–46.6)
HGB: 12.7 g/dL (ref 11.6–15.9)
Immature Retic Fract: 2.3 % (ref 1.60–10.00)
LYMPH#: 6.1 10*3/uL — AB (ref 0.9–3.3)
LYMPH%: 29.8 % (ref 14.0–49.7)
MCH: 30.7 pg (ref 25.1–34.0)
MCHC: 32.4 g/dL (ref 31.5–36.0)
MCV: 94.8 fL (ref 79.5–101.0)
MONO#: 1.2 10*3/uL — ABNORMAL HIGH (ref 0.1–0.9)
MONO%: 5.7 % (ref 0.0–14.0)
NEUT#: 13.1 10*3/uL — ABNORMAL HIGH (ref 1.5–6.5)
NEUT%: 63.5 % (ref 38.4–76.8)
PLATELETS: 543 10*3/uL — AB (ref 145–400)
RBC: 4.12 10*6/uL (ref 3.70–5.45)
RDW: 14.9 % — ABNORMAL HIGH (ref 11.2–14.5)
RETIC CT ABS: 44.91 10*3/uL (ref 33.70–90.70)
Retic %: 1.09 % (ref 0.70–2.10)
WBC: 20.6 10*3/uL — ABNORMAL HIGH (ref 3.9–10.3)

## 2015-10-10 LAB — COMPREHENSIVE METABOLIC PANEL (CC13)
ALT: 16 U/L (ref 0–55)
ANION GAP: 15 meq/L — AB (ref 3–11)
AST: 17 U/L (ref 5–34)
Albumin: 4 g/dL (ref 3.5–5.0)
Alkaline Phosphatase: 93 U/L (ref 40–150)
BUN: 51.5 mg/dL — ABNORMAL HIGH (ref 7.0–26.0)
CALCIUM: 8.8 mg/dL (ref 8.4–10.4)
CHLORIDE: 105 meq/L (ref 98–109)
CO2: 22 meq/L (ref 22–29)
CREATININE: 1.9 mg/dL — AB (ref 0.6–1.1)
EGFR: 23 mL/min/{1.73_m2} — AB (ref >90)
Glucose: 219 mg/dl — ABNORMAL HIGH (ref 70–140)
POTASSIUM: 4 meq/L (ref 3.5–5.1)
Sodium: 141 mEq/L (ref 136–145)
Total Bilirubin: 0.63 mg/dL (ref 0.20–1.20)
Total Protein: 6.5 g/dL (ref 6.4–8.3)

## 2015-10-10 LAB — TECHNOLOGIST REVIEW

## 2015-10-10 MED ORDER — SODIUM CHLORIDE 0.9 % IV SOLN
Freq: Once | INTRAVENOUS | Status: AC
  Administered 2015-10-10: 11:00:00 via INTRAVENOUS

## 2015-10-10 MED ORDER — METHYLPREDNISOLONE SODIUM SUCC 40 MG IJ SOLR
40.0000 mg | Freq: Every day | INTRAMUSCULAR | Status: DC
  Administered 2015-10-10: 40 mg via INTRAVENOUS

## 2015-10-10 MED ORDER — SODIUM CHLORIDE 0.9 % IV SOLN
200.0000 mg | Freq: Once | INTRAVENOUS | Status: AC
  Administered 2015-10-10: 200 mg via INTRAVENOUS
  Filled 2015-10-10: qty 20

## 2015-10-10 MED ORDER — SODIUM CHLORIDE 0.9 % IV SOLN
Freq: Once | INTRAVENOUS | Status: AC
  Administered 2015-10-10: 13:00:00 via INTRAVENOUS
  Filled 2015-10-10: qty 4

## 2015-10-10 MED ORDER — METHYLPREDNISOLONE SODIUM SUCC 40 MG IJ SOLR
INTRAMUSCULAR | Status: AC
  Filled 2015-10-10: qty 1

## 2015-10-10 MED ORDER — DIPHENHYDRAMINE HCL 25 MG PO CAPS
ORAL_CAPSULE | ORAL | Status: AC
  Filled 2015-10-10: qty 1

## 2015-10-10 MED ORDER — ACETAMINOPHEN 325 MG PO TABS
650.0000 mg | ORAL_TABLET | Freq: Once | ORAL | Status: AC
Start: 2015-10-10 — End: 2015-10-10
  Administered 2015-10-10: 650 mg via ORAL

## 2015-10-10 MED ORDER — FAMOTIDINE IN NACL 20-0.9 MG/50ML-% IV SOLN
20.0000 mg | Freq: Two times a day (BID) | INTRAVENOUS | Status: DC
  Administered 2015-10-10: 20 mg via INTRAVENOUS

## 2015-10-10 MED ORDER — DIPHENHYDRAMINE HCL 25 MG PO CAPS
25.0000 mg | ORAL_CAPSULE | Freq: Once | ORAL | Status: AC
  Administered 2015-10-10: 25 mg via ORAL

## 2015-10-10 MED ORDER — ACETAMINOPHEN 325 MG PO TABS
ORAL_TABLET | ORAL | Status: AC
  Filled 2015-10-10: qty 2

## 2015-10-10 MED ORDER — FAMOTIDINE IN NACL 20-0.9 MG/50ML-% IV SOLN
INTRAVENOUS | Status: AC
  Filled 2015-10-10: qty 50

## 2015-10-10 MED ORDER — METHYLPREDNISOLONE SODIUM SUCC 125 MG IJ SOLR
85.0000 mg | Freq: Once | INTRAMUSCULAR | Status: AC
  Administered 2015-10-10: 85 mg via INTRAVENOUS

## 2015-10-10 NOTE — Progress Notes (Signed)
1255 Patient complains of nausea and dizziness. Blood pressure taken. 86/38, pulse 91. Selena Lesser, NP notified. Normal saline wide open.   Olean, NP chairside, order given and carried out to hold rituxan, 8 mg zofran IVPB, 85 mg solu-medrol IV push.

## 2015-10-10 NOTE — Patient Instructions (Signed)
[facility] Discharge Instructions for Patients Receiving Chemotherapy  Today you received the following chemotherapy agents: Rituxan   To help prevent nausea and vomiting after your treatment, we encourage you to take your nausea medication as directed.    If you develop nausea and vomiting that is not controlled by your nausea medication, call the clinic.   BELOW ARE SYMPTOMS THAT SHOULD BE REPORTED IMMEDIATELY:  *FEVER GREATER THAN 100.5 F  *CHILLS WITH OR WITHOUT FEVER  NAUSEA AND VOMITING THAT IS NOT CONTROLLED WITH YOUR NAUSEA MEDICATION  *UNUSUAL SHORTNESS OF BREATH  *UNUSUAL BRUISING OR BLEEDING  TENDERNESS IN MOUTH AND THROAT WITH OR WITHOUT PRESENCE OF ULCERS  *URINARY PROBLEMS  *BOWEL PROBLEMS  UNUSUAL RASH Items with * indicate a potential emergency and should be followed up as soon as possible.  Feel free to call the clinic you have any questions or concerns. The clinic phone number is [phone].  Please show the CHEMO ALERT CARD at check-in to the Emergency Department and triage nurse.   

## 2015-10-10 NOTE — Progress Notes (Signed)
1315 IV's at 150 ml/hour, Zofran given IV 1340 Resumed Rituxan. Tolerated well so far. 1410 Had increase in temperature and slight chills but resolved on its own  See VS flowsheet.

## 2015-10-14 ENCOUNTER — Encounter: Payer: Self-pay | Admitting: Nurse Practitioner

## 2015-10-14 NOTE — Assessment & Plan Note (Signed)
Patient presented to the [facility] today to receive her next Rituxan infusion.  She last received her Aranesp injection on 09/05/2015.  While in the midst of receiving her Rituxan infusion-patient was noted to have a drop in her blood pressure down to 86/38.  She also experienced some mild nausea and vomiting.  Rituxan infusion was held; and managed per hypersensitivity protocol.  However, it is unclear if this was actually hypersensitivity reaction-since this has happened to patient on numerous occasions when receiving the Rituxan infusion.  Patient has a history of hypertension; and reports that she did take both her losartan and her Lasix medications today as previously directed.  See further notes for details.  Blood counts obtained today revealed a WBC of 20.6, ANC 13.1, hemoglobin 12.7, and ANC 543.  Patient has plans to return on 11/07/2015 for labs, visit, and chemotherapy.

## 2015-10-14 NOTE — Progress Notes (Signed)
SYMPTOM MANAGEMENT CLINIC   HPI: [name] 81 y.o. female diagnosed with chronic lymphocytic leukemia.  Currently undergoing Rituxan chemotherapy, and Aranesp injections.  Rituxan infusion was held; and managed per hypersensitivity protocol.  Patient was given Zofran and Solu-Medrol; which resolved all of patient's symptoms.  However, it is unclear if this was actually hypersensitivity reaction-since this has happened to patient on numerous occasions when receiving the Rituxan infusion.  Patient has a history of hypertension; and reports that she did take both her losartan and her Lasix medications today as previously directed.  Advised patient to hold both the losartan and the Lasix on the days of chemotherapy in the future to see if this prevents drops in blood pressure.  All instructions were given to the patient with a translator present.  HPI  ROS  Past Medical History  Diagnosis Date  . Diabetes mellitus   . Hyperlipidemia   . Hypertension   . Carotid stenosis, right 08/2005    40-60%  . History of colonic polyps 2002    Normal colonoscopy 08/2004  . CKD (chronic kidney disease) stage 2, GFR 60-89 ml/min   . CLL (chronic lymphocytic leukemia) (HCC)     Dr. [name]. s/p Rituxan  . Pulse irregularity     Etiology->frequent PACs, normal EKG    History reviewed. No pertinent past surgical history.  has Type 2 diabetes mellitus with stage 3 chronic kidney disease ([place]); Hyperlipidemia; Essential hypertension; CAROTID ARTERY STENOSIS, RIGHT; CKD (chronic kidney disease) stage 3, GFR 30-59 ml/min; COLONIC POLYPS, HX OF; CLL (chronic lymphocytic leukemia) ([facility]); Abnormality of gait; Advance directive discussed with patient; Osteoarthritis; Gastroesophageal reflux disease without esophagitis; Anemia of renal disease; Healthcare maintenance; Hypersensitivity reaction; and Vitamin D insufficiency on her problem list.    has No Known Allergies.    Medication List         This list is accurate as of: 10/10/15 11:59 PM.  Always use your most recent med list.               acetaminophen 500 MG tablet  Commonly known as:  TYLENOL  Take 1,000 mg by mouth 2 (two) times daily as needed. For pain     allopurinol 100 MG tablet  Commonly known as:  ZYLOPRIM  Take 1 tablet (100 mg total) by mouth daily.     CALCIUM PLUS VITAMIN D PO  Take 1 tablet by mouth 2 (two) times daily.     dipyridamole-aspirin 200-25 MG 12hr capsule  Commonly known as:  AGGRENOX  TAKE ONE CAPSULE BY MOUTH TWICE A DAY     furosemide 40 MG tablet  Commonly known as:  LASIX  Take 1 tablet (40 mg total) by mouth daily.     losartan 100 MG tablet  Commonly known as:  COZAAR  Take 1 tablet (100 mg total) by mouth daily.     omeprazole 20 MG capsule  Commonly known as:  PRILOSEC  Take 1 capsule (20 mg total) by mouth daily.     predniSONE 5 MG tablet  Commonly known as:  DELTASONE  Take one tablet (10 mg) daily with breakfast     simvastatin 40 MG tablet  Commonly known as:  ZOCOR  Take 1 tablet (40 mg total) by mouth at bedtime.     SYSTANE OP  Place 1 drop into both eyes 2 (two) times daily.     traMADol-acetaminophen 37.5-325 MG tablet  Commonly known as:  ULTRACET  Take 1-2 tablets by mouth  every 6 (six) hours as needed for moderate pain.         PHYSICAL EXAMINATION  Oncology Vitals 10/10/2015 10/10/2015  Height - -  Weight - -  Weight (lbs) - -  BMI (kg/m2) - -  Temp 98.6 98.6  Pulse 93 88  Resp 18 18  Resp (Historical as of 06/23/12) - -  SpO2 94 93  BSA (m2) - -   BP Readings from Last 2 Encounters:  10/10/15 132/47  09/13/15 142/47    Physical Exam  Constitutional: She is oriented to person, place, and time and well-developed, well-nourished, and in no distress.  HENT:  Head: Normocephalic and atraumatic.  Eyes: Conjunctivae and EOM are normal. Pupils are equal, round, and reactive to light. Right eye exhibits no discharge. Left eye exhibits no  discharge. No scleral icterus.  Neck: Normal range of motion.  Pulmonary/Chest: Effort normal. No stridor. No respiratory distress.  Musculoskeletal: Normal range of motion.  Neurological: She is alert and oriented to person, place, and time.  Skin: Skin is warm and dry.  Psychiatric: Affect normal.  Nursing note and vitals reviewed.   LABORATORY DATA:. Appointment on 10/10/2015  Component Date Value Ref Range Status  . WBC 10/10/2015 20.6* 3.9 - 10.3 10e3/uL Final  . NEUT# 10/10/2015 13.1* 1.5 - 6.5 10e3/uL Final  . HGB 10/10/2015 12.7  11.6 - 15.9 g/dL Final  . HCT 10/10/2015 39.1  34.8 - 46.6 % Final  . Platelets 10/10/2015 543* 145 - 400 10e3/uL Final  . MCV 10/10/2015 94.8  79.5 - 101.0 fL Final  . MCH 10/10/2015 30.7  25.1 - 34.0 pg Final  . MCHC 10/10/2015 32.4  31.5 - 36.0 g/dL Final  . RBC 10/10/2015 4.12  3.70 - 5.45 10e6/uL Final  . RDW 10/10/2015 14.9* 11.2 - 14.5 % Final  . lymph# 10/10/2015 6.1* 0.9 - 3.3 10e3/uL Final  . MONO# 10/10/2015 1.2* 0.1 - 0.9 10e3/uL Final  . Eosinophils Absolute 10/10/2015 0.1  0.0 - 0.5 10e3/uL Final  . Basophils Absolute 10/10/2015 0.1  0.0 - 0.1 10e3/uL Final  . NEUT% 10/10/2015 63.5  38.4 - 76.8 % Final  . LYMPH% 10/10/2015 29.8  14.0 - 49.7 % Final  . MONO% 10/10/2015 5.7  0.0 - 14.0 % Final  . EOS% 10/10/2015 0.4  0.0 - 7.0 % Final  . BASO% 10/10/2015 0.6  0.0 - 2.0 % Final  . Retic % 10/10/2015 1.09  0.70 - 2.10 % Final  . Retic Ct Abs 10/10/2015 44.91  33.70 - 90.70 10e3/uL Final  . Immature Retic Fract 10/10/2015 2.30  1.60 - 10.00 % Final  . Sodium 10/10/2015 141  136 - 145 mEq/L Final  . Potassium 10/10/2015 4.0  3.5 - 5.1 mEq/L Final  . Chloride 10/10/2015 105  98 - 109 mEq/L Final  . CO2 10/10/2015 22  22 - 29 mEq/L Final  . Glucose 10/10/2015 219* 70 - 140 mg/dl Final   Glucose reference range is for nonfasting patients. Fasting glucose reference range is 70- 100.  [name] BUN 10/10/2015 51.5* 7.0 - 26.0 mg/dL Final  .  Creatinine 10/10/2015 1.9* 0.6 - 1.1 mg/dL Final  . Total Bilirubin 10/10/2015 0.63  0.20 - 1.20 mg/dL Final  . Alkaline Phosphatase 10/10/2015 93  40 - 150 U/L Final  . AST 10/10/2015 17  5 - 34 U/L Final  . ALT 10/10/2015 16  0 - 55 U/L Final  . Total Protein 10/10/2015 6.5  6.4 - 8.3 g/dL Final  .  Albumin 10/10/2015 4.0  3.5 - 5.0 g/dL Final  . Calcium 10/10/2015 8.8  8.4 - 10.4 mg/dL Final  . Anion Gap 10/10/2015 15* 3 - 11 mEq/L Final  . EGFR 10/10/2015 23* >90 ml/min/1.73 m2 Final   eGFR is calculated using the CKD-EPI Creatinine Equation (2009)  . Technologist Review 10/10/2015 Variant lymphs present   Final     RADIOGRAPHIC STUDIES: No results found.  ASSESSMENT/PLAN:    CLL (chronic lymphocytic leukemia) Patient presented to the [facility] today to receive her next Rituxan infusion.  She last received her Aranesp injection on 09/05/2015.  While in the midst of receiving her Rituxan infusion-patient was noted to have a drop in her blood pressure down to 86/38.  She also experienced some mild nausea and vomiting.  Rituxan infusion was held; and managed per hypersensitivity protocol.  However, it is unclear if this was actually hypersensitivity reaction-since this has happened to patient on numerous occasions when receiving the Rituxan infusion.  Patient has a history of hypertension; and reports that she did take both her losartan and her Lasix medications today as previously directed.  See further notes for details.  Blood counts obtained today revealed a WBC of 20.6, ANC 13.1, hemoglobin 12.7, and ANC 543.  Patient has plans to return on 11/07/2015 for labs, visit, and chemotherapy.  Hypersensitivity reaction Patient presented to the [facility] today to receive her next Rituxan infusion.   While in the midst of receiving her Rituxan infusion-patient was noted to have a drop in her blood pressure down to 86/38.  She also experienced some mild nausea and  vomiting.  Rituxan infusion was held; and managed per hypersensitivity protocol.  Patient was given Zofran and Solu-Medrol; which resolved all of patient's symptoms.  However, it is unclear if this was actually hypersensitivity reaction-since this has happened to patient on numerous occasions when receiving the Rituxan infusion.  Patient has a history of hypertension; and reports that she did take both her losartan and her Lasix medications today as previously directed.  Advised patient to hold both the losartan and the Lasix on the days of chemotherapy in the future to see if this prevents drops in blood pressure.  All instructions were given to the patient with a translator present.    Patient stated understanding of all instructions; and was in agreement with this plan of care. The patient knows to call the clinic with any problems, questions or concerns.   Review/collaboration with Dr. [name] regarding all aspects of patient's visit today.   Total time spent with patient was 25 minutes;  with greater than 75 percent of that time spent in face to face counseling regarding patient's symptoms,  and coordination of care and follow up.  Disclaimer:This dictation was prepared with Dragon/digital dictation along with [employer]. Any transcriptional errors that result from this process are unintentional.  Drue Second, NP 10/14/2015

## 2015-10-14 NOTE — Assessment & Plan Note (Signed)
Patient presented to the [facility] today to receive her next Rituxan infusion.   While in the midst of receiving her Rituxan infusion-patient was noted to have a drop in her blood pressure down to 86/38.  She also experienced some mild nausea and vomiting.  Rituxan infusion was held; and managed per hypersensitivity protocol.  Patient was given Zofran and Solu-Medrol; which resolved all of patient's symptoms.  However, it is unclear if this was actually hypersensitivity reaction-since this has happened to patient on numerous occasions when receiving the Rituxan infusion.  Patient has a history of hypertension; and reports that she did take both her losartan and her Lasix medications today as previously directed.  Advised patient to hold both the losartan and the Lasix on the days of chemotherapy in the future to see if this prevents drops in blood pressure.  All instructions were given to the patient with a translator present.

## 2015-11-04 ENCOUNTER — Other Ambulatory Visit: Payer: Self-pay | Admitting: Internal Medicine

## 2015-11-07 ENCOUNTER — Ambulatory Visit: Payer: Medicare Other | Admitting: Nurse Practitioner

## 2015-11-07 ENCOUNTER — Other Ambulatory Visit (HOSPITAL_BASED_OUTPATIENT_CLINIC_OR_DEPARTMENT_OTHER): Payer: Medicare Other

## 2015-11-07 ENCOUNTER — Ambulatory Visit (HOSPITAL_BASED_OUTPATIENT_CLINIC_OR_DEPARTMENT_OTHER): Payer: Medicare Other

## 2015-11-07 ENCOUNTER — Other Ambulatory Visit: Payer: Medicare Other

## 2015-11-07 VITALS — BP 138/52 | HR 98 | Temp 97.5°F | Resp 18

## 2015-11-07 DIAGNOSIS — Z5111 Encounter for antineoplastic chemotherapy: Secondary | ICD-10-CM | POA: Diagnosis present

## 2015-11-07 DIAGNOSIS — C9110 Chronic lymphocytic leukemia of B-cell type not having achieved remission: Secondary | ICD-10-CM | POA: Diagnosis present

## 2015-11-07 LAB — CBC & DIFF AND RETIC
BASO%: 0.2 % (ref 0.0–2.0)
BASOS ABS: 0 10*3/uL (ref 0.0–0.1)
EOS ABS: 0.1 10*3/uL (ref 0.0–0.5)
EOS%: 0.3 % (ref 0.0–7.0)
HEMATOCRIT: 38.4 % (ref 34.8–46.6)
HEMOGLOBIN: 12.2 g/dL (ref 11.6–15.9)
Immature Retic Fract: 3.8 % (ref 1.60–10.00)
LYMPH%: 27.2 % (ref 14.0–49.7)
MCH: 30.5 pg (ref 25.1–34.0)
MCHC: 31.8 g/dL (ref 31.5–36.0)
MCV: 96 fL (ref 79.5–101.0)
MONO#: 2 10*3/uL — ABNORMAL HIGH (ref 0.1–0.9)
MONO%: 11.8 % (ref 0.0–14.0)
NEUT#: 10.3 10*3/uL — ABNORMAL HIGH (ref 1.5–6.5)
NEUT%: 60.5 % (ref 38.4–76.8)
PLATELETS: 447 10*3/uL — AB (ref 145–400)
RBC: 4 10*6/uL (ref 3.70–5.45)
RDW: 14.5 % (ref 11.2–14.5)
Retic %: 1.36 % (ref 0.70–2.10)
Retic Ct Abs: 54.4 10*3/uL (ref 33.70–90.70)
WBC: 17.1 10*3/uL — ABNORMAL HIGH (ref 3.9–10.3)
lymph#: 4.7 10*3/uL — ABNORMAL HIGH (ref 0.9–3.3)

## 2015-11-07 LAB — COMPREHENSIVE METABOLIC PANEL
ALT: 12 U/L (ref 0–55)
AST: 22 U/L (ref 5–34)
Albumin: 4.1 g/dL (ref 3.5–5.0)
Alkaline Phosphatase: 106 U/L (ref 40–150)
Anion Gap: 15 mEq/L — ABNORMAL HIGH (ref 3–11)
BILIRUBIN TOTAL: 0.42 mg/dL (ref 0.20–1.20)
BUN: 42.6 mg/dL — AB (ref 7.0–26.0)
CO2: 25 meq/L (ref 22–29)
CREATININE: 2.2 mg/dL — AB (ref 0.6–1.1)
Calcium: 10 mg/dL (ref 8.4–10.4)
Chloride: 102 mEq/L (ref 98–109)
EGFR: 19 mL/min/{1.73_m2} — ABNORMAL LOW (ref >90)
GLUCOSE: 206 mg/dL — AB (ref 70–140)
Potassium: 4.3 mEq/L (ref 3.5–5.1)
SODIUM: 142 meq/L (ref 136–145)
TOTAL PROTEIN: 6.8 g/dL (ref 6.4–8.3)

## 2015-11-07 LAB — TECHNOLOGIST REVIEW

## 2015-11-07 MED ORDER — ACETAMINOPHEN 325 MG PO TABS
650.0000 mg | ORAL_TABLET | Freq: Once | ORAL | Status: AC
  Administered 2015-11-07: 650 mg via ORAL

## 2015-11-07 MED ORDER — DIPHENHYDRAMINE HCL 25 MG PO CAPS
25.0000 mg | ORAL_CAPSULE | Freq: Once | ORAL | Status: AC
  Administered 2015-11-07: 25 mg via ORAL

## 2015-11-07 MED ORDER — METHYLPREDNISOLONE SODIUM SUCC 40 MG IJ SOLR
40.0000 mg | Freq: Every day | INTRAMUSCULAR | Status: DC
  Administered 2015-11-07: 40 mg via INTRAVENOUS

## 2015-11-07 MED ORDER — DIPHENHYDRAMINE HCL 25 MG PO CAPS
ORAL_CAPSULE | ORAL | Status: AC
  Filled 2015-11-07: qty 1

## 2015-11-07 MED ORDER — SODIUM CHLORIDE 0.9 % IV SOLN
INTRAVENOUS | Status: DC | PRN
  Administered 2015-11-07: 13:00:00 via INTRAVENOUS

## 2015-11-07 MED ORDER — METHYLPREDNISOLONE SODIUM SUCC 40 MG IJ SOLR
INTRAMUSCULAR | Status: AC
  Filled 2015-11-07: qty 1

## 2015-11-07 MED ORDER — ACETAMINOPHEN 325 MG PO TABS
ORAL_TABLET | ORAL | Status: AC
  Filled 2015-11-07: qty 2

## 2015-11-07 MED ORDER — FAMOTIDINE IN NACL 20-0.9 MG/50ML-% IV SOLN
20.0000 mg | Freq: Two times a day (BID) | INTRAVENOUS | Status: DC
  Administered 2015-11-07: 20 mg via INTRAVENOUS

## 2015-11-07 MED ORDER — SODIUM CHLORIDE 0.9 % IV SOLN
200.0000 mg | Freq: Once | INTRAVENOUS | Status: AC
  Administered 2015-11-07: 200 mg via INTRAVENOUS
  Filled 2015-11-07: qty 20

## 2015-11-07 MED ORDER — FAMOTIDINE IN NACL 20-0.9 MG/50ML-% IV SOLN
INTRAVENOUS | Status: AC
  Filled 2015-11-07: qty 50

## 2015-11-07 NOTE — Patient Instructions (Signed)
[facility] Discharge Instructions for Patients Receiving Chemotherapy  Today you received the following chemotherapy agents Rituxan To help prevent nausea and vomiting after your treatment, we encourage you to take your nausea medication as prescribed.  If you develop nausea and vomiting that is not controlled by your nausea medication, call the clinic.   BELOW ARE SYMPTOMS THAT SHOULD BE REPORTED IMMEDIATELY:  *FEVER GREATER THAN 100.5 F  *CHILLS WITH OR WITHOUT FEVER  NAUSEA AND VOMITING THAT IS NOT CONTROLLED WITH YOUR NAUSEA MEDICATION  *UNUSUAL SHORTNESS OF BREATH  *UNUSUAL BRUISING OR BLEEDING  TENDERNESS IN MOUTH AND THROAT WITH OR WITHOUT PRESENCE OF ULCERS  *URINARY PROBLEMS  *BOWEL PROBLEMS  UNUSUAL RASH Items with * indicate a potential emergency and should be followed up as soon as possible.  Feel free to call the clinic you have any questions or concerns. The clinic phone number is [phone].  Please show the CHEMO ALERT CARD at check-in to the Emergency Department and triage nurse.   

## 2015-11-08 ENCOUNTER — Other Ambulatory Visit: Payer: Self-pay | Admitting: Oncology

## 2015-11-08 ENCOUNTER — Telehealth: Payer: Self-pay | Admitting: Oncology

## 2015-11-08 NOTE — Telephone Encounter (Signed)
Mailed a new calendar and letter for appointment change per pof  anne

## 2015-11-14 ENCOUNTER — Encounter: Payer: Self-pay | Admitting: *Deleted

## 2015-11-14 NOTE — Progress Notes (Signed)
[place] Work  Clinical Social Work was referred by Futures trader for assistance with transportation.  Clinical Social Worker made referral to [employer] through [employer]. They plan to mail family application for assistance, as pt and her husband are very hard of hearing. This CSW mailed letter stating referral had been completed and also explaining additional resources for transportation, such as Glass blower/designer and [employer]. There are multiple resources in the area that could assist them in getting to appointments. Next appt is 01/09/16. CSW will follow up prior to appointment and confirm transportation plan. CSW will also meet with pt at that appointment to further review resources and assess needs.   Clinical Social Work interventions: Resource assistance  Loren Racer, [place] Worker [place]  [place] Phone: [phone] Fax: [phone]

## 2015-11-26 ENCOUNTER — Other Ambulatory Visit: Payer: Self-pay | Admitting: Internal Medicine

## 2015-12-05 ENCOUNTER — Ambulatory Visit: Payer: Medicare Other

## 2015-12-05 ENCOUNTER — Other Ambulatory Visit: Payer: Medicare Other

## 2015-12-05 ENCOUNTER — Ambulatory Visit: Payer: Medicare Other | Admitting: Oncology

## 2016-01-09 ENCOUNTER — Encounter: Payer: Self-pay | Admitting: *Deleted

## 2016-01-09 ENCOUNTER — Other Ambulatory Visit: Payer: Self-pay | Admitting: *Deleted

## 2016-01-09 ENCOUNTER — Other Ambulatory Visit (HOSPITAL_BASED_OUTPATIENT_CLINIC_OR_DEPARTMENT_OTHER): Payer: Medicare Other

## 2016-01-09 ENCOUNTER — Ambulatory Visit (HOSPITAL_BASED_OUTPATIENT_CLINIC_OR_DEPARTMENT_OTHER): Payer: Medicare Other | Admitting: Oncology

## 2016-01-09 ENCOUNTER — Ambulatory Visit (HOSPITAL_BASED_OUTPATIENT_CLINIC_OR_DEPARTMENT_OTHER): Payer: Medicare Other

## 2016-01-09 ENCOUNTER — Telehealth: Payer: Self-pay | Admitting: Oncology

## 2016-01-09 VITALS — BP 125/77 | HR 88 | Temp 96.0°F | Resp 18

## 2016-01-09 VITALS — BP 131/50 | HR 109 | Temp 97.9°F | Resp 18 | Ht <= 58 in | Wt 131.8 lb

## 2016-01-09 DIAGNOSIS — N189 Chronic kidney disease, unspecified: Secondary | ICD-10-CM | POA: Diagnosis not present

## 2016-01-09 DIAGNOSIS — C9110 Chronic lymphocytic leukemia of B-cell type not having achieved remission: Secondary | ICD-10-CM

## 2016-01-09 DIAGNOSIS — Z5112 Encounter for antineoplastic immunotherapy: Secondary | ICD-10-CM

## 2016-01-09 DIAGNOSIS — N183 Chronic kidney disease, stage 3 unspecified: Secondary | ICD-10-CM

## 2016-01-09 DIAGNOSIS — D631 Anemia in chronic kidney disease: Secondary | ICD-10-CM

## 2016-01-09 DIAGNOSIS — E1122 Type 2 diabetes mellitus with diabetic chronic kidney disease: Secondary | ICD-10-CM

## 2016-01-09 DIAGNOSIS — E119 Type 2 diabetes mellitus without complications: Secondary | ICD-10-CM | POA: Diagnosis not present

## 2016-01-09 LAB — CBC & DIFF AND RETIC
BASO%: 1 % (ref 0.0–2.0)
BASOS ABS: 0.2 10*3/uL — AB (ref 0.0–0.1)
EOS%: 0.6 % (ref 0.0–7.0)
Eosinophils Absolute: 0.2 10*3/uL (ref 0.0–0.5)
HCT: 34.9 % (ref 34.8–46.6)
HEMOGLOBIN: 11.2 g/dL — AB (ref 11.6–15.9)
Immature Retic Fract: 5.5 % (ref 1.60–10.00)
LYMPH%: 56.5 % — ABNORMAL HIGH (ref 14.0–49.7)
MCH: 30.4 pg (ref 25.1–34.0)
MCHC: 32.1 g/dL (ref 31.5–36.0)
MCV: 94.6 fL (ref 79.5–101.0)
MONO#: 0.9 10*3/uL (ref 0.1–0.9)
MONO%: 3.7 % (ref 0.0–14.0)
NEUT%: 38.2 % — ABNORMAL LOW (ref 38.4–76.8)
NEUTROS ABS: 9.7 10*3/uL — AB (ref 1.5–6.5)
NRBC: 0 % (ref 0–0)
Platelets: 495 10*3/uL — ABNORMAL HIGH (ref 145–400)
RBC: 3.69 10*6/uL — ABNORMAL LOW (ref 3.70–5.45)
RDW: 15 % — AB (ref 11.2–14.5)
Retic %: 1.91 % (ref 0.70–2.10)
Retic Ct Abs: 70.48 10*3/uL (ref 33.70–90.70)
WBC: 25.3 10*3/uL — AB (ref 3.9–10.3)
lymph#: 14.3 10*3/uL — ABNORMAL HIGH (ref 0.9–3.3)

## 2016-01-09 LAB — COMPREHENSIVE METABOLIC PANEL
ALBUMIN: 4 g/dL (ref 3.5–5.0)
ALK PHOS: 76 U/L (ref 40–150)
ALT: 12 U/L (ref 0–55)
ANION GAP: 16 meq/L — AB (ref 3–11)
AST: 18 U/L (ref 5–34)
BILIRUBIN TOTAL: 0.7 mg/dL (ref 0.20–1.20)
BUN: 31.1 mg/dL — ABNORMAL HIGH (ref 7.0–26.0)
CALCIUM: 9.4 mg/dL (ref 8.4–10.4)
CO2: 21 mEq/L — ABNORMAL LOW (ref 22–29)
CREATININE: 2.2 mg/dL — AB (ref 0.6–1.1)
Chloride: 105 mEq/L (ref 98–109)
EGFR: 19 mL/min/{1.73_m2} — AB (ref >90)
Glucose: 232 mg/dl — ABNORMAL HIGH (ref 70–140)
Potassium: 4 mEq/L (ref 3.5–5.1)
Sodium: 143 mEq/L (ref 136–145)
TOTAL PROTEIN: 6.6 g/dL (ref 6.4–8.3)

## 2016-01-09 LAB — TECHNOLOGIST REVIEW

## 2016-01-09 MED ORDER — FAMOTIDINE IN NACL 20-0.9 MG/50ML-% IV SOLN
INTRAVENOUS | Status: AC
  Filled 2016-01-09: qty 50

## 2016-01-09 MED ORDER — METHYLPREDNISOLONE SODIUM SUCC 40 MG IJ SOLR
40.0000 mg | Freq: Every day | INTRAMUSCULAR | Status: DC
  Administered 2016-01-09: 40 mg via INTRAVENOUS

## 2016-01-09 MED ORDER — DIPHENHYDRAMINE HCL 25 MG PO CAPS
25.0000 mg | ORAL_CAPSULE | Freq: Once | ORAL | Status: AC
  Administered 2016-01-09: 25 mg via ORAL

## 2016-01-09 MED ORDER — ACETAMINOPHEN 325 MG PO TABS
650.0000 mg | ORAL_TABLET | Freq: Once | ORAL | Status: AC
  Administered 2016-01-09: 650 mg via ORAL

## 2016-01-09 MED ORDER — SODIUM CHLORIDE 0.9 % IV SOLN: INTRAVENOUS | Status: DC | PRN

## 2016-01-09 MED ORDER — FAMOTIDINE IN NACL 20-0.9 MG/50ML-% IV SOLN
20.0000 mg | Freq: Once | INTRAVENOUS | Status: AC
  Administered 2016-01-09: 20 mg via INTRAVENOUS

## 2016-01-09 MED ORDER — SODIUM CHLORIDE 0.9 % IV SOLN
400.0000 mg | Freq: Once | INTRAVENOUS | Status: AC
  Administered 2016-01-09: 400 mg via INTRAVENOUS
  Filled 2016-01-09: qty 40

## 2016-01-09 MED ORDER — DIPHENHYDRAMINE HCL 25 MG PO CAPS
ORAL_CAPSULE | ORAL | Status: AC
  Filled 2016-01-09: qty 1

## 2016-01-09 MED ORDER — ACETAMINOPHEN 325 MG PO TABS
ORAL_TABLET | ORAL | Status: AC
  Filled 2016-01-09: qty 2

## 2016-01-09 MED ORDER — METHYLPREDNISOLONE SODIUM SUCC 40 MG IJ SOLR
INTRAMUSCULAR | Status: AC
  Filled 2016-01-09: qty 1

## 2016-01-09 NOTE — Progress Notes (Signed)
**Note Tracy-Identified via Obfuscation** ID: [name]   DOB: 04/28/1921  MR#: [id number]  CSN#:[id number]  PCP: Juluis Mire, MD GYN: SU:  OTHER MD: Susa Day  CHIEF COMPLAINT: Chronic lymphoid leukemia, anemia of renal failure  CURRENT TREATMENT: Low-dose prednisone, rituximab  INTERVAL HISTORY: [name] returns for follow up of her chronic lymphoid leukemia. Her husband [name] did not accompany her today. This is very unusual and actually a little worrisome. However she tells me that he is doing "so-so ".--She herself continues to do "fine". Her only complaints are that her dentures don't fit very well. She has arthritis here and there but the pain is not more intense or persistent than before. She is tolerating the right toxin treatments here with no side effects that she is aware of.[name]   REVIEW OF SYSTEMS: [name] says she had pulled has to watch your sugar", but really does not know what her sugars are running at home. She denies any fever. She denies significant weight loss or fatigue. There has been no rash or bleeding problem. Overall the detailed review of systems today was stable.  PAST MEDICAL HISTORY: Past Medical History  Diagnosis Date  . Diabetes mellitus   . Hyperlipidemia   . Hypertension   . Carotid stenosis, right 08/2005    40-60%  . History of colonic polyps 2002    Normal colonoscopy 08/2004  . CKD (chronic kidney disease) stage 2, GFR 60-89 ml/min   . CLL (chronic lymphocytic leukemia) (HCC)     Dr. [name]. s/p Rituxan  . Pulse irregularity     Etiology->frequent PACs, normal EKG    FAMILY HISTORY No family history on file.  GYNECOLOGIC HISTORY: GX P0  SOCIAL HISTORY: She is originally from [place]. 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 They have no children. They are Catholic.  ADVANCED DIRECTIVES: in place  HEALTH MAINTENANCE: Social History  Substance Use Topics  . Smoking status: Former Research scientist (life sciences)  . Smokeless tobacco: Not on file  . Alcohol Use: Not on file      Colonoscopy: Buccini  PAP:  Bone density:  Lipid panel:  No Known Allergies  Current Outpatient Prescriptions  Medication Sig Dispense Refill  . acetaminophen (TYLENOL) 500 MG tablet Take 1,000 mg by mouth 2 (two) times daily as needed. For pain    . allopurinol (ZYLOPRIM) 100 MG tablet Take 1 tablet (100 mg total) by mouth daily. 90 tablet 3  . Calcium Carbonate-Vitamin D (CALCIUM PLUS VITAMIN D PO) Take 1 tablet by mouth 2 (two) times daily.    [name] dipyridamole-aspirin (AGGRENOX) 200-25 MG per 12 hr capsule TAKE ONE CAPSULE BY MOUTH TWICE A DAY 200 capsule 2  . furosemide (LASIX) 40 MG tablet Take 1 tablet (40 mg total) by mouth daily. 90 tablet 3  . losartan (COZAAR) 100 MG tablet Take 1 tablet (100 mg total) by mouth daily. 90 tablet 3  . omeprazole (PRILOSEC) 20 MG capsule Take 1 capsule (20 mg total) by mouth daily. 90 capsule 3  . Polyethyl Glycol-Propyl Glycol (SYSTANE OP) Place 1 drop into both eyes 2 (two) times daily.    . predniSONE (DELTASONE) 5 MG tablet Take one tablet (10 mg) daily with breakfast 30 tablet 6  . simvastatin (ZOCOR) 40 MG tablet Take 1 tablet (40 mg total) by mouth at bedtime. 90 tablet 3  . traMADol-acetaminophen (ULTRACET) 37.5-325 MG per tablet Take 1-2 tablets by mouth every 6 (six) hours as needed for moderate pain. 30 tablet 3   No  current facility-administered medications for this visit.    OBJECTIVE: Elderly white woman examined ina wheelchair  Filed Vitals:   01/09/16 1050  BP: 131/50  Pulse: 109  Temp: 97.9 F (36.6 C)  Resp: 18     Body mass index is 30.63 kg/(Hampton^2).    ECOG FS: 1  Sclerae unicteric, pupils round and equal Oropharynx clear and moist-- no thrush or other lesions No cervical or supraclavicular adenopathy; there is left axillary adenopathy, which is softer than prior. Lungs no rales or rhonchi Heart regular rate and rhythm Abd soft, nontender, positive bowel sounds MSK no focal spinal tenderness, no upper extremity  lymphedema Neuro: nonfocal, well oriented, positive affect Breasts: Deferred   LAB RESULTS: Lab Results  Component Value Date   WBC 25.3* 01/09/2016   NEUTROABS 9.7* 01/09/2016   HGB 11.2* 01/09/2016   HCT 34.9 01/09/2016   MCV 94.6 01/09/2016   PLT 495* 01/09/2016      Chemistry      Component Value Date/Time   NA 143 01/09/2016 1022   NA 147* 06/07/2015 1550   K 4.0 01/09/2016 1022   K 4.5 06/07/2015 1550   CL 105 06/07/2015 1550   CL 103 04/18/2013 1422   CO2 21* 01/09/2016 1022   CO2 30 06/07/2015 1550   BUN 31.1* 01/09/2016 1022   BUN 25* 06/07/2015 1550   CREATININE 2.2* 01/09/2016 1022   CREATININE 1.15* 06/07/2015 1550   CREATININE 1.89* 06/28/2012 1425      Component Value Date/Time   CALCIUM 9.4 01/09/2016 1022   CALCIUM 9.2 06/07/2015 1550   CALCIUM 10.5 08/07/2008 0000   ALKPHOS 76 01/09/2016 1022   ALKPHOS 91 06/07/2015 1550   AST 18 01/09/2016 1022   AST 22 06/07/2015 1550   ALT 12 01/09/2016 1022   ALT 12 06/07/2015 1550   BILITOT 0.70 01/09/2016 1022   BILITOT 0.5 06/07/2015 1550     Results for BIRT, [name] (MRN [id number]) as of 01/09/2016 11:17  Ref. Range 09/05/2015 11:53 09/13/2015 09:52 10/10/2015 10:51 11/07/2015 12:03 01/09/2016 10:22  lymph# Latest Ref Range: 0.9-3.3 10e3/uL 4.3 (H) 11.5 (H) 6.1 (H) 4.7 (H) 14.3 (H)    No results found for: LABCA2  No components found for: LABCA125  No results for input(s): INR in the last 168 hours.  Urinalysis No results found for: COLORURINE  STUDIES: No results found.   ASSESSMENT: 82 y.o.  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  Status post treatment with Rituxan in 2004 and 2007, then as below  (2) anemia, possibly due to CLL progression but certainly worsened by chronic renal insufficiency     (a) aranesp started 02/07/2015, to be repeated every 28  days  (b) feraheme to be given whenever ferritin falls to <100  (3) rituximab started 02/14/2015, repeated weekly x7 at lod dose with excellent response  (a) allopurinol started 02/07/2015  (b) hepatitis serologies drawn 02/11/2015, negative  (c) rituximab low-dose maintenance started 06/03/2015, repeated every 4 weeks initially, switched to every 8 weeks after December 2016  PLAN:  [name] continues to look well. She is tolerating the right toxin much better and I am going to go up to 400 mg at this time. If she tolerates it well we will go to her normal dose which would be 600 mg 2 months from now.  She came here by taxi. I am asking her social workers to help  her with transportation back home once her treatment and and also to get here and back for her next treatment in April.  Her hemoglobin continues above 11 with no Aranesp. Her chronic kidney disease is stable  She will return in April for treatment and then in June she will see me and get treated at the same time. She knows to call for fever, bleeding, rash, or any new problem.  [name] C    01/09/2016

## 2016-01-09 NOTE — Patient Instructions (Signed)
[facility] Discharge Instructions for Patients Receiving Chemotherapy  Today you received the following chemotherapy agents Rituxan To help prevent nausea and vomiting after your treatment, we encourage you to take your nausea medication as prescribed.  If you develop nausea and vomiting that is not controlled by your nausea medication, call the clinic.   BELOW ARE SYMPTOMS THAT SHOULD BE REPORTED IMMEDIATELY:  *FEVER GREATER THAN 100.5 F  *CHILLS WITH OR WITHOUT FEVER  NAUSEA AND VOMITING THAT IS NOT CONTROLLED WITH YOUR NAUSEA MEDICATION  *UNUSUAL SHORTNESS OF BREATH  *UNUSUAL BRUISING OR BLEEDING  TENDERNESS IN MOUTH AND THROAT WITH OR WITHOUT PRESENCE OF ULCERS  *URINARY PROBLEMS  *BOWEL PROBLEMS  UNUSUAL RASH Items with * indicate a potential emergency and should be followed up as soon as possible.  Feel free to call the clinic you have any questions or concerns. The clinic phone number is [phone].  Please show the CHEMO ALERT CARD at check-in to the Emergency Department and triage nurse.   

## 2016-01-09 NOTE — Telephone Encounter (Signed)
Made changes to appt times dur to pt transportation needs. New schedule will be printed in infusion

## 2016-01-09 NOTE — Progress Notes (Signed)
[place] Work  Clinical Social Work had arranged transportation through [employer], [employer] program prior to today's appointment. This was challenging as pt and her husband are both hearing impaired and required letters to arrange this resource. Pt was brought today by [employer] and they will bring her home. CSW confirmed this plan with nursing. CSW attempted to discuss this with pt, but we had trouble communicating. CSW attempted to contact [employer] to confirm plan, but their coordinator was out for there day. CSW will set up additional appointments for pt through [employer]. Husband did not attend appt with her today and this made it difficult. CSW to follow up via letter on his concerns.   Clinical Social Work interventions: Resource coordination Pt advocacy   Loren Racer, [place] Worker [place]  [place] Phone: [phone] Fax: [phone]

## 2016-01-09 NOTE — Telephone Encounter (Signed)
Appointments made and avs printed °

## 2016-01-20 ENCOUNTER — Encounter: Payer: Self-pay | Admitting: *Deleted

## 2016-01-20 NOTE — Progress Notes (Signed)
[place] Work  Clinical Social Work contacted Armed forces technical officer through [employer] to arrange transportation pt's next appt at [facility]. [employer] aware of appt for 03/05/16 and will advise if they can find driver for pt.  Clinical Social Worker will confirm with pt via mail once confirmation of driver is made. CSW to continue to follow and assist accordingly.   Clinical Social Work interventions: Resource coordination  Loren Racer, [place] Worker [place]  [place] Phone: [phone] Fax: [phone]

## 2016-01-22 ENCOUNTER — Encounter: Payer: Self-pay | Admitting: *Deleted

## 2016-01-29 ENCOUNTER — Encounter: Payer: Self-pay | Admitting: *Deleted

## 2016-01-29 NOTE — Progress Notes (Signed)
[place] Work  Clinical Social Work spoke with [employer] to arrange transportation for pt for her April 13 appointment. They will look for a volunteer driver for pt and confirm with CSW. CSW will reach out to pt and husband via mail to confirm as they are both hearing impaired.    Clinical Social Work interventions: Transportation coordination  Loren Racer, [place] Social Worker [place]  [place] Phone: [phone] Fax: [phone]

## 2016-02-22 ENCOUNTER — Other Ambulatory Visit: Payer: Self-pay | Admitting: Oncology

## 2016-02-27 ENCOUNTER — Encounter: Payer: Self-pay | Admitting: *Deleted

## 2016-02-27 NOTE — Progress Notes (Signed)
[place] Work  Clinical Social Work contacted Development worker, community, Armed forces technical officer to confirm they have a driver for [employer]. They plan to bring her and pick her up for her appointment on 03/05/16. CSW mailed pt and her husband notifying them of this arrangement as a phone call is difficult due to their hearing impairments. CSW will do the same for appointment in July. The volunteer will return to get pt after her appointment.   Clinical Social Work interventions: Resource coordination Pt advocacy  Loren Racer, [place] Worker [place]  [place] Phone: [phone] Fax: [phone]

## 2016-03-03 ENCOUNTER — Telehealth: Payer: Self-pay | Admitting: *Deleted

## 2016-03-03 ENCOUNTER — Telehealth: Payer: Self-pay

## 2016-03-03 NOTE — Telephone Encounter (Signed)
Message left on recorder for DrMagrinat's nurse-wanted to inform office that pt's husband is now deceased and wanted to update office on pt's future long-term care (as pt may be relocating to [place]) and family is requesting info to take to new MD in [place].[name], Tracy Spivey Cassady4/11/20173:51 PM

## 2016-03-03 NOTE — Telephone Encounter (Signed)
Zigmund Daniel from internal medicine called to let MD know that patient will be moving back to [place].  Patient's husband passed away last week and she has no one to care for her here.  Patient is coming in Thursday and Zigmund Daniel is requesting that patient see a provider.  Per Dr. [name] patient can see he or Nira Conn, NP depending on who has availabilty. POF placed.

## 2016-03-04 ENCOUNTER — Encounter: Payer: Self-pay | Admitting: *Deleted

## 2016-03-04 ENCOUNTER — Telehealth: Payer: Self-pay | Admitting: *Deleted

## 2016-03-04 NOTE — Progress Notes (Signed)
[place] Work  Clinical Social Work reviewed chart and phoned pt to confirm driver plans for tomorrow. Leonette Nutting from [employer] will bring pt to her appt tomorrow. Pt reports friends are staying with her during the day and she is deciding what to do about moving back to [place]. Driver aware pt will be here most of the day and will drop pt and then return to pick up. Her number is [phone], in case there is a need. Pt reports family is not in town with her.    Clinical Social Work interventions: Coordination of resources  Loren Racer, [place] [facility] [place]  [place] Phone: [phone] Fax: [phone]

## 2016-03-04 NOTE — Telephone Encounter (Signed)
Per staff message and POF I have scheduled appts. Advised scheduler of appts. JMW  

## 2016-03-05 ENCOUNTER — Telehealth: Payer: Self-pay | Admitting: Oncology

## 2016-03-05 ENCOUNTER — Encounter: Payer: Self-pay | Admitting: *Deleted

## 2016-03-05 ENCOUNTER — Other Ambulatory Visit (HOSPITAL_BASED_OUTPATIENT_CLINIC_OR_DEPARTMENT_OTHER): Payer: Medicare Other

## 2016-03-05 ENCOUNTER — Ambulatory Visit (HOSPITAL_BASED_OUTPATIENT_CLINIC_OR_DEPARTMENT_OTHER): Payer: Medicare Other

## 2016-03-05 ENCOUNTER — Ambulatory Visit (HOSPITAL_BASED_OUTPATIENT_CLINIC_OR_DEPARTMENT_OTHER): Payer: Medicare Other | Admitting: Oncology

## 2016-03-05 VITALS — BP 143/60 | HR 74 | Temp 98.1°F | Resp 18

## 2016-03-05 VITALS — BP 105/43 | HR 70 | Temp 97.5°F | Resp 17 | Ht <= 58 in | Wt 128.8 lb

## 2016-03-05 DIAGNOSIS — Z5112 Encounter for antineoplastic immunotherapy: Secondary | ICD-10-CM

## 2016-03-05 DIAGNOSIS — N189 Chronic kidney disease, unspecified: Secondary | ICD-10-CM

## 2016-03-05 DIAGNOSIS — C9110 Chronic lymphocytic leukemia of B-cell type not having achieved remission: Secondary | ICD-10-CM

## 2016-03-05 DIAGNOSIS — D631 Anemia in chronic kidney disease: Secondary | ICD-10-CM

## 2016-03-05 LAB — CBC & DIFF AND RETIC
BASO%: 0.1 % (ref 0.0–2.0)
Basophils Absolute: 0 10*3/uL (ref 0.0–0.1)
EOS%: 0.7 % (ref 0.0–7.0)
Eosinophils Absolute: 0.1 10*3/uL (ref 0.0–0.5)
HCT: 33.7 % — ABNORMAL LOW (ref 34.8–46.6)
HGB: 11 g/dL — ABNORMAL LOW (ref 11.6–15.9)
Immature Retic Fract: 5.6 % (ref 1.60–10.00)
LYMPH#: 6.7 10*3/uL — AB (ref 0.9–3.3)
LYMPH%: 38.3 % (ref 14.0–49.7)
MCH: 31.9 pg (ref 25.1–34.0)
MCHC: 32.6 g/dL (ref 31.5–36.0)
MCV: 97.7 fL (ref 79.5–101.0)
MONO#: 2.2 10*3/uL — ABNORMAL HIGH (ref 0.1–0.9)
MONO%: 12.4 % (ref 0.0–14.0)
NEUT#: 8.5 10*3/uL — ABNORMAL HIGH (ref 1.5–6.5)
NEUT%: 48.5 % (ref 38.4–76.8)
PLATELETS: 462 10*3/uL — AB (ref 145–400)
RBC: 3.45 10*6/uL — AB (ref 3.70–5.45)
RDW: 13.9 % (ref 11.2–14.5)
RETIC CT ABS: 41.75 10*3/uL (ref 33.70–90.70)
Retic %: 1.21 % (ref 0.70–2.10)
WBC: 17.5 10*3/uL — ABNORMAL HIGH (ref 3.9–10.3)

## 2016-03-05 LAB — COMPREHENSIVE METABOLIC PANEL
ALBUMIN: 3.7 g/dL (ref 3.5–5.0)
ALK PHOS: 68 U/L (ref 40–150)
ALT: 20 U/L (ref 0–55)
ANION GAP: 13 meq/L — AB (ref 3–11)
AST: 19 U/L (ref 5–34)
BILIRUBIN TOTAL: 0.37 mg/dL (ref 0.20–1.20)
BUN: 63.1 mg/dL — ABNORMAL HIGH (ref 7.0–26.0)
CO2: 23 mEq/L (ref 22–29)
CREATININE: 2 mg/dL — AB (ref 0.6–1.1)
Calcium: 8 mg/dL — ABNORMAL LOW (ref 8.4–10.4)
Chloride: 108 mEq/L (ref 98–109)
EGFR: 21 mL/min/{1.73_m2} — AB (ref >90)
GLUCOSE: 140 mg/dL (ref 70–140)
Potassium: 3.8 mEq/L (ref 3.5–5.1)
SODIUM: 144 meq/L (ref 136–145)
TOTAL PROTEIN: 6 g/dL — AB (ref 6.4–8.3)

## 2016-03-05 LAB — TECHNOLOGIST REVIEW

## 2016-03-05 MED ORDER — DIPHENHYDRAMINE HCL 25 MG PO CAPS
ORAL_CAPSULE | ORAL | Status: AC
Start: 2016-03-05 — End: 2016-03-05
  Filled 2016-03-05: qty 1

## 2016-03-05 MED ORDER — SODIUM CHLORIDE 0.9 % IV SOLN
INTRAVENOUS | Status: DC | PRN
  Administered 2016-03-05: 12:00:00 via INTRAVENOUS

## 2016-03-05 MED ORDER — METHYLPREDNISOLONE SODIUM SUCC 40 MG IJ SOLR
40.0000 mg | Freq: Every day | INTRAMUSCULAR | Status: DC
  Administered 2016-03-05: 40 mg via INTRAVENOUS

## 2016-03-05 MED ORDER — ACETAMINOPHEN 325 MG PO TABS
ORAL_TABLET | ORAL | Status: AC
  Filled 2016-03-05: qty 2

## 2016-03-05 MED ORDER — METHYLPREDNISOLONE SODIUM SUCC 40 MG IJ SOLR
INTRAMUSCULAR | Status: AC
  Filled 2016-03-05: qty 1

## 2016-03-05 MED ORDER — ACETAMINOPHEN 325 MG PO TABS
650.0000 mg | ORAL_TABLET | Freq: Once | ORAL | Status: AC
  Administered 2016-03-05: 650 mg via ORAL

## 2016-03-05 MED ORDER — FAMOTIDINE IN NACL 20-0.9 MG/50ML-% IV SOLN
INTRAVENOUS | Status: AC
  Filled 2016-03-05: qty 50

## 2016-03-05 MED ORDER — SODIUM CHLORIDE 0.9 % IV SOLN
400.0000 mg | Freq: Once | INTRAVENOUS | Status: AC
  Administered 2016-03-05: 400 mg via INTRAVENOUS
  Filled 2016-03-05: qty 40

## 2016-03-05 MED ORDER — FAMOTIDINE IN NACL 20-0.9 MG/50ML-% IV SOLN
20.0000 mg | Freq: Once | INTRAVENOUS | Status: AC
  Administered 2016-03-05: 20 mg via INTRAVENOUS

## 2016-03-05 MED ORDER — DIPHENHYDRAMINE HCL 25 MG PO CAPS
25.0000 mg | ORAL_CAPSULE | Freq: Once | ORAL | Status: AC
  Administered 2016-03-05: 25 mg via ORAL

## 2016-03-05 NOTE — Patient Instructions (Signed)
[facility] Discharge Instructions for Patients Receiving Chemotherapy  Today you received the following chemotherapy agents Rituxan To help prevent nausea and vomiting after your treatment, we encourage you to take your nausea medication as prescribed.  If you develop nausea and vomiting that is not controlled by your nausea medication, call the clinic.   BELOW ARE SYMPTOMS THAT SHOULD BE REPORTED IMMEDIATELY:  *FEVER GREATER THAN 100.5 F  *CHILLS WITH OR WITHOUT FEVER  NAUSEA AND VOMITING THAT IS NOT CONTROLLED WITH YOUR NAUSEA MEDICATION  *UNUSUAL SHORTNESS OF BREATH  *UNUSUAL BRUISING OR BLEEDING  TENDERNESS IN MOUTH AND THROAT WITH OR WITHOUT PRESENCE OF ULCERS  *URINARY PROBLEMS  *BOWEL PROBLEMS  UNUSUAL RASH Items with * indicate a potential emergency and should be followed up as soon as possible.  Feel free to call the clinic you have any questions or concerns. The clinic phone number is [phone].  Please show the CHEMO ALERT CARD at check-in to the Emergency Department and triage nurse.   

## 2016-03-05 NOTE — Telephone Encounter (Signed)
cxld all appts after today per GM 4/13 pof

## 2016-03-05 NOTE — Progress Notes (Signed)
ID: [name]   DOB: [birth year]  MR#: [id number]  CSN#:[id number]  PCP: Juluis Mire, MD GYN: SU:  OTHER MD: Susa Day  CHIEF COMPLAINT: Chronic lymphoid leukemia, anemia of renal failure  CURRENT TREATMENT: Low-dose prednisone, rituximab  INTERVAL HISTORY: [name] returns today for a further treatment with right toxin area for her chronic lymphoid leukemia. Since last visit here her husband cycle has passed away. The patient can live by herself and is planning to move next week back to [place] to live with her brother. She was brought here today by a friend.  REVIEW OF SYSTEMS: [name] continues to do "well". She denies any fevers, rash, or bleeding problems. She is not aware of any adenopathy. She continues to be as active as she can be usually doing a little bit of housework every day. A detailed review of systems today was otherwise stable  PAST MEDICAL HISTORY: Past Medical History  Diagnosis Date  . Diabetes mellitus   . Hyperlipidemia   . Hypertension   . Carotid stenosis, right 08/2005    40-60%  . History of colonic polyps 2002    Normal colonoscopy 08/2004  . CKD (chronic kidney disease) stage 2, GFR 60-89 ml/min   . CLL (chronic lymphocytic leukemia) (HCC)     Dr. [name]. s/p Rituxan  . Pulse irregularity     Etiology->frequent PACs, normal EKG    FAMILY HISTORY No family history on file.  GYNECOLOGIC HISTORY: GX P0  SOCIAL HISTORY: She is originally from [place]. 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 They have no children. They are Catholic.  ADVANCED DIRECTIVES: in place  HEALTH MAINTENANCE: Social History  Substance Use Topics  . Smoking status: Former Research scientist (life sciences)  . Smokeless tobacco: Not on file  . Alcohol Use: Not on file     Colonoscopy: Buccini  PAP:  Bone density:  Lipid panel:  No Known Allergies  Current Outpatient Prescriptions  Medication Sig Dispense Refill  . acetaminophen (TYLENOL) 500 MG tablet Take 1,000  mg by mouth 2 (two) times daily as needed. For pain    . allopurinol (ZYLOPRIM) 100 MG tablet Take 1 tablet (100 mg total) by mouth daily. 90 tablet 3  . Calcium Carbonate-Vitamin D (CALCIUM PLUS VITAMIN D PO) Take 1 tablet by mouth 2 (two) times daily.    [name] dipyridamole-aspirin (AGGRENOX) 200-25 MG per 12 hr capsule TAKE ONE CAPSULE BY MOUTH TWICE A DAY 200 capsule 2  . furosemide (LASIX) 40 MG tablet Take 1 tablet (40 mg total) by mouth daily. 90 tablet 3  . losartan (COZAAR) 100 MG tablet Take 1 tablet (100 mg total) by mouth daily. 90 tablet 3  . omeprazole (PRILOSEC) 20 MG capsule Take 1 capsule (20 mg total) by mouth daily. 90 capsule 3  . Polyethyl Glycol-Propyl Glycol (SYSTANE OP) Place 1 drop into both eyes 2 (two) times daily.    . predniSONE (DELTASONE) 10 MG tablet TAKE 1 TABLET BY MOUTH ONCE DAILY WITH BREAKFAST 30 tablet 3  . predniSONE (DELTASONE) 5 MG tablet Take one tablet (10 mg) daily with breakfast 30 tablet 6  . simvastatin (ZOCOR) 40 MG tablet Take 1 tablet (40 mg total) by mouth at bedtime. 90 tablet 3  . traMADol-acetaminophen (ULTRACET) 37.5-325 MG per tablet Take 1-2 tablets by mouth every 6 (six) hours as needed for moderate pain. 30 tablet 3   No current facility-administered medications for this visit.    OBJECTIVE: Elderly white woman In a wheelchair  Filed Vitals:   03/05/16 1035  BP: 105/43  Pulse: 70  Temp: 97.5 F (36.4 C)  Resp: 17     Body mass index is 29.94 kg/(m^2).    ECOG FS: 1  Sclerae unicteric, EOMs intact Oropharynx clear and moist No cervical or supraclavicular adenopathy Lungs no rales or rhonchi Heart regular rate and rhythm Abd soft, nontender, positive bowel sounds MSK no focal spinal tenderness, no upper extremity lymphedema Neuro: nonfocal, pleasant affect Breasts: Deferred    LAB RESULTS: Lab Results  Component Value Date   WBC 17.5* 03/05/2016   NEUTROABS 8.5* 03/05/2016   HGB 11.0* 03/05/2016   HCT 33.7* 03/05/2016    MCV 97.7 03/05/2016   PLT 462* 03/05/2016      Chemistry      Component Value Date/Time   NA 144 03/05/2016 0949   NA 147* 06/07/2015 1550   K 3.8 03/05/2016 0949   K 4.5 06/07/2015 1550   CL 105 06/07/2015 1550   CL 103 04/18/2013 1422   CO2 23 03/05/2016 0949   CO2 30 06/07/2015 1550   BUN 63.1* 03/05/2016 0949   BUN 25* 06/07/2015 1550   CREATININE 2.0* 03/05/2016 0949   CREATININE 1.15* 06/07/2015 1550   CREATININE 1.89* 06/28/2012 1425      Component Value Date/Time   CALCIUM 8.0* 03/05/2016 0949   CALCIUM 9.2 06/07/2015 1550   CALCIUM 10.5 08/07/2008 0000   ALKPHOS 68 03/05/2016 0949   ALKPHOS 91 06/07/2015 1550   AST 19 03/05/2016 0949   AST 22 06/07/2015 1550   ALT 20 03/05/2016 0949   ALT 12 06/07/2015 1550   BILITOT 0.37 03/05/2016 0949   BILITOT 0.5 06/07/2015 1550     No results found for: LABCA2  No components found for: [id number]  No results for input(s): INR in the last 168 hours.  Urinalysis No results found for: COLORURINE  STUDIES: No results found.   ASSESSMENT: 82 y.o.  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  Status post treatment with Rituxan in 2004 and 2007, then as below  (2) anemia, possibly due to CLL progression but certainly worsened by chronic renal insufficiency     (a) aranesp started 02/07/2015, to be repeated every 28 days  (b) feraheme to be given whenever ferritin falls to <100  (3) rituximab started 02/14/2015, repeated weekly x7 at lod dose with excellent response  (a) allopurinol started 02/07/2015  (b) hepatitis serologies drawn 02/11/2015, negative  (c) rituximab low-dose maintenance started 06/03/2015, repeated every 4 weeks initially, switched to every 8 weeks after December 2016, final dose in the [place] 03/05/2016  PLAN:  Stanley back we will go ahead and receive her  right rituximab today. She has developed tolerance and is doing much better with the infusions. If she stayed in the [place] we would continue these every 2 months indefinitely. However she is going to be moving back to [place].  We gave her a copy of her records. She understands that there are many different ways of treating chronic lymphoid leukemia and her doctors in [place] may wish to pursue a different path.  I expressed our condolences from me and our staff regarding her husband's death.  She knows even long distance we will be glad to do whatever we can to help her in the future.  Shaquille Murdy C    03/05/2016

## 2016-03-13 ENCOUNTER — Encounter: Payer: Self-pay | Admitting: Internal Medicine

## 2016-03-13 ENCOUNTER — Encounter: Payer: Medicare Other | Admitting: Internal Medicine

## 2016-03-13 ENCOUNTER — Ambulatory Visit: Payer: Medicare Other | Admitting: Internal Medicine

## 2016-03-13 ENCOUNTER — Ambulatory Visit (INDEPENDENT_AMBULATORY_CARE_PROVIDER_SITE_OTHER): Payer: Medicare Other | Admitting: Internal Medicine

## 2016-03-13 VITALS — BP 128/48 | HR 87 | Temp 98.4°F | Ht <= 58 in | Wt 129.4 lb

## 2016-03-13 DIAGNOSIS — J3489 Other specified disorders of nose and nasal sinuses: Secondary | ICD-10-CM | POA: Diagnosis not present

## 2016-03-13 DIAGNOSIS — R05 Cough: Secondary | ICD-10-CM

## 2016-03-13 DIAGNOSIS — E1122 Type 2 diabetes mellitus with diabetic chronic kidney disease: Secondary | ICD-10-CM

## 2016-03-13 DIAGNOSIS — N183 Chronic kidney disease, stage 3 unspecified: Secondary | ICD-10-CM

## 2016-03-13 DIAGNOSIS — J302 Other seasonal allergic rhinitis: Secondary | ICD-10-CM | POA: Insufficient documentation

## 2016-03-13 DIAGNOSIS — I129 Hypertensive chronic kidney disease with stage 1 through stage 4 chronic kidney disease, or unspecified chronic kidney disease: Secondary | ICD-10-CM

## 2016-03-13 DIAGNOSIS — I10 Essential (primary) hypertension: Secondary | ICD-10-CM

## 2016-03-13 LAB — POCT GLYCOSYLATED HEMOGLOBIN (HGB A1C): HEMOGLOBIN A1C: 6.9

## 2016-03-13 LAB — GLUCOSE, CAPILLARY: GLUCOSE-CAPILLARY: 195 mg/dL — AB (ref 65–99)

## 2016-03-13 MED ORDER — LOSARTAN POTASSIUM 100 MG PO TABS: 100.0000 mg | ORAL_TABLET | Freq: Every day | ORAL | Status: DC

## 2016-03-13 MED ORDER — FUROSEMIDE 40 MG PO TABS: 40.0000 mg | ORAL_TABLET | Freq: Every day | ORAL | Status: DC

## 2016-03-13 NOTE — Assessment & Plan Note (Signed)
Patient has had a nonproductive cough as well as clear rhinorrhea for the past 3 days without any other symptoms. She denies sick contacts, fevers, sinus congestion or tenderness, throat soreness or itchiness, shortness of breath, chest pain or congestion, or any other symptoms at this time. She says these exact symptoms occur right around this time of the year, when the air pollen level increases. She otherwise feels well. This is most likely seasonal allergies. I doubt a viral URI due to the lack of other symptoms. -I recommended that she could try hot tea with honey to, as well as generic loratadine or cetirizine to help with her symptoms.

## 2016-03-13 NOTE — Assessment & Plan Note (Signed)
BP Readings from Last 3 Encounters:  03/13/16 128/48  03/05/16 143/60  03/05/16 105/43    Lab Results  Component Value Date   NA 144 03/05/2016   K 3.8 03/05/2016   CREATININE 2.0* 03/05/2016    Assessment: Blood pressure control:  controlled Progress toward BP goal:   at goal Comments: on losartan and furosemide  Plan: Medications:  continue current medications

## 2016-03-13 NOTE — Progress Notes (Signed)
   Patient ID: ALAYNNA LOUGHNANE female   DOB: [birth year] 82 y.o.   MRN: [id number]  Subjective:   HPI: Ms.Camarie KACE ASEBEDO is a 82 y.o. with PMH of CLL on rituximab and prednisone, non-insulin dependent Type 2 DM with CKD Stage 3, HTN, who presents to [facility] today for Medical advice and clearance for international flight as well as evaluation of a cough. As her husband recently died, she is accompanied by her grandson and relative today to discuss her flying back home to [place] to live with family  Please see problem-based charting for status of medical issues pertinent to this visit.  Review of Systems: Pertinent items noted in HPI and remainder of comprehensive ROS otherwise negative.  Objective:  Physical Exam: Filed Vitals:   03/13/16 1100  BP: 128/48  Pulse: 87  Temp: 98.4 F (36.9 C)  TempSrc: Oral  Height: 4\' 7"  (1.397 m)  Weight: 129 lb 6.4 oz (58.695 kg)  SpO2: 100%   Gen: Well-appearing, alert and oriented to person, place, and time HEENT: Oropharynx clear without erythema or exudate.  Neck: No cervical LAD, no thyromegaly or nodules, no JVD noted. CV: Normal rate, regular rhythm, no murmurs, rubs, or gallops Pulmonary: Normal effort, CTA bilaterally, no wheezing, rales, or rhonchi Abdominal: Soft, non-tender, non-distended, without rebound, guarding, or masses Extremities: Distal pulses 2+ in upper and lower extremities bilaterally, no tenderness, erythema or edema Skin: No atypical appearing moles. No rashes  Assessment & Plan:  1. Medical clearance for international flight -Given that she has no significant cardiopulmonary disease or other apparent medical illnesses that would preclude her from international flight, I did provide her a doctor's letter endorsing her medical fitness to fly. I also reviewed all of her medications per her request and gave her an updated list of her current medications to bring with her to [place]. We also contacted the medical records office  and they are forwarding all of her records too. I told her to contact the clinic if she could use any further assistance from [place] in facilitating this process.  Please see problem-based charting for additional assessment and plan.  Blane Ohara, MD Resident Physician, PGY-1 Department of Internal Medicine [facility]

## 2016-03-13 NOTE — Patient Instructions (Signed)
[name],  For your cough and runny nose, your symptoms should get better on their own over the next few days. However, if you would like to take over-the-counter allergy medicines, such as generic loratadine or cetirizine, this may help. Also, you can try hot tea with honey, which may help as well.  For your incontinence, the best way to decrease this is by working with a pelvic floor physical therapist that can train you to do pelvic floor exercises on your own.  I hope you have a safe flight and enjoy your time with family in [place].  It was a pleasure meeting you! Take care,  Blane Ohara

## 2016-03-16 NOTE — Progress Notes (Signed)
Medicine attending: Medical history, presenting problems, physical findings, and medications, reviewed with resident physician Dr William Kennedy on the day of the patient visit and I concur with his evaluation and management plan. 

## 2016-03-19 ENCOUNTER — Encounter (HOSPITAL_COMMUNITY): Payer: Self-pay | Admitting: *Deleted

## 2016-03-19 DIAGNOSIS — E860 Dehydration: Secondary | ICD-10-CM | POA: Diagnosis present

## 2016-03-19 DIAGNOSIS — Z87891 Personal history of nicotine dependence: Secondary | ICD-10-CM

## 2016-03-19 DIAGNOSIS — I6521 Occlusion and stenosis of right carotid artery: Secondary | ICD-10-CM | POA: Diagnosis present

## 2016-03-19 DIAGNOSIS — N183 Chronic kidney disease, stage 3 (moderate): Secondary | ICD-10-CM | POA: Diagnosis present

## 2016-03-19 DIAGNOSIS — Z79899 Other long term (current) drug therapy: Secondary | ICD-10-CM

## 2016-03-19 DIAGNOSIS — J9601 Acute respiratory failure with hypoxia: Secondary | ICD-10-CM | POA: Diagnosis present

## 2016-03-19 DIAGNOSIS — J440 Chronic obstructive pulmonary disease with acute lower respiratory infection: Principal | ICD-10-CM | POA: Diagnosis present

## 2016-03-19 DIAGNOSIS — J189 Pneumonia, unspecified organism: Secondary | ICD-10-CM | POA: Diagnosis present

## 2016-03-19 DIAGNOSIS — K219 Gastro-esophageal reflux disease without esophagitis: Secondary | ICD-10-CM | POA: Diagnosis present

## 2016-03-19 DIAGNOSIS — C9110 Chronic lymphocytic leukemia of B-cell type not having achieved remission: Secondary | ICD-10-CM | POA: Diagnosis present

## 2016-03-19 DIAGNOSIS — E1122 Type 2 diabetes mellitus with diabetic chronic kidney disease: Secondary | ICD-10-CM | POA: Diagnosis present

## 2016-03-19 DIAGNOSIS — E785 Hyperlipidemia, unspecified: Secondary | ICD-10-CM | POA: Diagnosis present

## 2016-03-19 DIAGNOSIS — N179 Acute kidney failure, unspecified: Secondary | ICD-10-CM | POA: Diagnosis present

## 2016-03-19 DIAGNOSIS — D649 Anemia, unspecified: Secondary | ICD-10-CM | POA: Diagnosis present

## 2016-03-19 DIAGNOSIS — I129 Hypertensive chronic kidney disease with stage 1 through stage 4 chronic kidney disease, or unspecified chronic kidney disease: Secondary | ICD-10-CM | POA: Diagnosis present

## 2016-03-19 DIAGNOSIS — J441 Chronic obstructive pulmonary disease with (acute) exacerbation: Secondary | ICD-10-CM | POA: Diagnosis present

## 2016-03-19 DIAGNOSIS — R0602 Shortness of breath: Secondary | ICD-10-CM | POA: Diagnosis not present

## 2016-03-19 LAB — BASIC METABOLIC PANEL
Anion gap: 17 — ABNORMAL HIGH (ref 5–15)
BUN: 69 mg/dL — AB (ref 6–20)
CALCIUM: 7.4 mg/dL — AB (ref 8.9–10.3)
CO2: 20 mmol/L — AB (ref 22–32)
CREATININE: 2.39 mg/dL — AB (ref 0.44–1.00)
Chloride: 100 mmol/L — ABNORMAL LOW (ref 101–111)
GFR calc Af Amer: 19 mL/min — ABNORMAL LOW (ref >60)
GFR, EST NON AFRICAN AMERICAN: 17 mL/min — AB (ref >60)
GLUCOSE: 143 mg/dL — AB (ref 65–99)
Potassium: 4.7 mmol/L (ref 3.5–5.1)
Sodium: 137 mmol/L (ref 135–145)

## 2016-03-19 LAB — CBC
HEMATOCRIT: 32.9 % — AB (ref 36.0–46.0)
Hemoglobin: 11 g/dL — ABNORMAL LOW (ref 12.0–15.0)
MCH: 31.3 pg (ref 26.0–34.0)
MCHC: 33.4 g/dL (ref 30.0–36.0)
MCV: 93.7 fL (ref 78.0–100.0)
PLATELETS: 435 10*3/uL — AB (ref 150–400)
RBC: 3.51 MIL/uL — ABNORMAL LOW (ref 3.87–5.11)
RDW: 14 % (ref 11.5–15.5)
WBC: 22.1 10*3/uL — AB (ref 4.0–10.5)

## 2016-03-19 NOTE — ED Notes (Signed)
Pt has had a cough for 10 days and had a fever on Sunday (unknown exact temp) her daughter is here from [place] to come take her back with her and felt she should have her checked.  Pt was seen at [facility] and had a chest x-ray that showed pneumonia and she was told to come to ED due to her age.  Pt denies any sob and appears to be feeling ok

## 2016-03-20 ENCOUNTER — Other Ambulatory Visit: Payer: Self-pay | Admitting: Internal Medicine

## 2016-03-20 ENCOUNTER — Inpatient Hospital Stay (HOSPITAL_COMMUNITY): Payer: Medicare Other

## 2016-03-20 ENCOUNTER — Inpatient Hospital Stay (HOSPITAL_COMMUNITY)
Admission: EM | Admit: 2016-03-20 | Discharge: 2016-03-23 | DRG: 190 | Disposition: A | Discharge status: Skilled Nursing Facility | Payer: Medicare Other | Attending: Internal Medicine | Admitting: Internal Medicine

## 2016-03-20 ENCOUNTER — Emergency Department (HOSPITAL_COMMUNITY): Payer: Medicare Other

## 2016-03-20 DIAGNOSIS — R05 Cough: Secondary | ICD-10-CM

## 2016-03-20 DIAGNOSIS — E1122 Type 2 diabetes mellitus with diabetic chronic kidney disease: Secondary | ICD-10-CM

## 2016-03-20 DIAGNOSIS — L8990 Pressure ulcer of unspecified site, unspecified stage: Secondary | ICD-10-CM | POA: Diagnosis present

## 2016-03-20 DIAGNOSIS — N183 Chronic kidney disease, stage 3 unspecified: Secondary | ICD-10-CM

## 2016-03-20 DIAGNOSIS — J9601 Acute respiratory failure with hypoxia: Secondary | ICD-10-CM | POA: Diagnosis present

## 2016-03-20 DIAGNOSIS — I6521 Occlusion and stenosis of right carotid artery: Secondary | ICD-10-CM

## 2016-03-20 DIAGNOSIS — I129 Hypertensive chronic kidney disease with stage 1 through stage 4 chronic kidney disease, or unspecified chronic kidney disease: Secondary | ICD-10-CM | POA: Diagnosis present

## 2016-03-20 DIAGNOSIS — R0902 Hypoxemia: Secondary | ICD-10-CM

## 2016-03-20 DIAGNOSIS — K219 Gastro-esophageal reflux disease without esophagitis: Secondary | ICD-10-CM | POA: Diagnosis present

## 2016-03-20 DIAGNOSIS — I10 Essential (primary) hypertension: Secondary | ICD-10-CM

## 2016-03-20 DIAGNOSIS — R059 Cough, unspecified: Secondary | ICD-10-CM

## 2016-03-20 DIAGNOSIS — J440 Chronic obstructive pulmonary disease with acute lower respiratory infection: Secondary | ICD-10-CM | POA: Diagnosis present

## 2016-03-20 DIAGNOSIS — J189 Pneumonia, unspecified organism: Secondary | ICD-10-CM | POA: Diagnosis present

## 2016-03-20 DIAGNOSIS — C9110 Chronic lymphocytic leukemia of B-cell type not having achieved remission: Secondary | ICD-10-CM | POA: Diagnosis present

## 2016-03-20 DIAGNOSIS — N179 Acute kidney failure, unspecified: Secondary | ICD-10-CM

## 2016-03-20 DIAGNOSIS — E785 Hyperlipidemia, unspecified: Secondary | ICD-10-CM | POA: Diagnosis present

## 2016-03-20 DIAGNOSIS — E860 Dehydration: Secondary | ICD-10-CM | POA: Diagnosis present

## 2016-03-20 DIAGNOSIS — Z87891 Personal history of nicotine dependence: Secondary | ICD-10-CM | POA: Diagnosis not present

## 2016-03-20 DIAGNOSIS — D649 Anemia, unspecified: Secondary | ICD-10-CM | POA: Diagnosis present

## 2016-03-20 DIAGNOSIS — J441 Chronic obstructive pulmonary disease with (acute) exacerbation: Secondary | ICD-10-CM

## 2016-03-20 DIAGNOSIS — Z79899 Other long term (current) drug therapy: Secondary | ICD-10-CM | POA: Diagnosis not present

## 2016-03-20 DIAGNOSIS — R0602 Shortness of breath: Secondary | ICD-10-CM | POA: Diagnosis present

## 2016-03-20 LAB — BASIC METABOLIC PANEL
Anion gap: 18 — ABNORMAL HIGH (ref 5–15)
BUN: 61 mg/dL — AB (ref 6–20)
CALCIUM: 7.3 mg/dL — AB (ref 8.9–10.3)
CHLORIDE: 104 mmol/L (ref 101–111)
CO2: 18 mmol/L — ABNORMAL LOW (ref 22–32)
Creatinine, Ser: 1.93 mg/dL — ABNORMAL HIGH (ref 0.44–1.00)
GFR calc non Af Amer: 21 mL/min — ABNORMAL LOW (ref >60)
GFR, EST AFRICAN AMERICAN: 25 mL/min — AB (ref >60)
GLUCOSE: 92 mg/dL (ref 65–99)
POTASSIUM: 3.6 mmol/L (ref 3.5–5.1)
SODIUM: 140 mmol/L (ref 135–145)

## 2016-03-20 LAB — MRSA PCR SCREENING: MRSA by PCR: NEGATIVE

## 2016-03-20 LAB — PROCALCITONIN
Procalcitonin: 0.51 ng/mL
Procalcitonin: 0.4 ng/mL

## 2016-03-20 LAB — MAGNESIUM: MAGNESIUM: 1.2 mg/dL — AB (ref 1.7–2.4)

## 2016-03-20 LAB — BRAIN NATRIURETIC PEPTIDE: B NATRIURETIC PEPTIDE 5: 214 pg/mL — AB (ref 0.0–100.0)

## 2016-03-20 LAB — I-STAT CG4 LACTIC ACID, ED: Lactic Acid, Venous: 1.87 mmol/L (ref 0.5–2.0)

## 2016-03-20 MED ORDER — IPRATROPIUM-ALBUTEROL 0.5-2.5 (3) MG/3ML IN SOLN
3.0000 mL | RESPIRATORY_TRACT | Status: DC
  Administered 2016-03-20: 3 mL via RESPIRATORY_TRACT
  Filled 2016-03-20: qty 3

## 2016-03-20 MED ORDER — ACETAMINOPHEN 500 MG PO TABS: 1000.0000 mg | ORAL_TABLET | Freq: Two times a day (BID) | ORAL | Status: AC | PRN

## 2016-03-20 MED ORDER — DEXTROSE 5 % IV SOLN
1.0000 g | Freq: Once | INTRAVENOUS | Status: AC
  Administered 2016-03-20: 1 g via INTRAVENOUS
  Filled 2016-03-20: qty 10

## 2016-03-20 MED ORDER — HEPARIN SODIUM (PORCINE) 5000 UNIT/ML IJ SOLN
5000.0000 [IU] | Freq: Three times a day (TID) | INTRAMUSCULAR | Status: DC
  Administered 2016-03-20 – 2016-03-23 (×11): 5000 [IU] via SUBCUTANEOUS
  Filled 2016-03-20 (×11): qty 1

## 2016-03-20 MED ORDER — FUROSEMIDE 40 MG PO TABS: 40.0000 mg | ORAL_TABLET | Freq: Every day | ORAL | Status: DC

## 2016-03-20 MED ORDER — SODIUM CHLORIDE 0.9 % IV SOLN
INTRAVENOUS | Status: AC
  Administered 2016-03-20: 06:00:00 via INTRAVENOUS

## 2016-03-20 MED ORDER — LOSARTAN POTASSIUM 100 MG PO TABS: 100.0000 mg | ORAL_TABLET | Freq: Every day | ORAL | Status: DC

## 2016-03-20 MED ORDER — BENZONATATE 100 MG PO CAPS
100.0000 mg | ORAL_CAPSULE | Freq: Two times a day (BID) | ORAL | Status: DC
  Administered 2016-03-20 – 2016-03-23 (×7): 100 mg via ORAL
  Filled 2016-03-20 (×7): qty 1

## 2016-03-20 MED ORDER — POLYETHYL GLYCOL-PROPYL GLYCOL 0.4-0.3 % OP GEL: Freq: Two times a day (BID) | OPHTHALMIC | Status: DC

## 2016-03-20 MED ORDER — ACETAMINOPHEN 650 MG RE SUPP: 650.0000 mg | Freq: Four times a day (QID) | RECTAL | Status: DC | PRN

## 2016-03-20 MED ORDER — OMEPRAZOLE 20 MG PO CPDR: 20.0000 mg | DELAYED_RELEASE_CAPSULE | Freq: Every day | ORAL | Status: AC

## 2016-03-20 MED ORDER — ALLOPURINOL 100 MG PO TABS: 100.0000 mg | ORAL_TABLET | Freq: Every day | ORAL | Status: AC

## 2016-03-20 MED ORDER — SIMVASTATIN 40 MG PO TABS: 40.0000 mg | ORAL_TABLET | Freq: Every day | ORAL | Status: AC

## 2016-03-20 MED ORDER — PANTOPRAZOLE SODIUM 40 MG PO TBEC
40.0000 mg | DELAYED_RELEASE_TABLET | Freq: Every day | ORAL | Status: DC
  Administered 2016-03-20 – 2016-03-23 (×4): 40 mg via ORAL
  Filled 2016-03-20 (×4): qty 1

## 2016-03-20 MED ORDER — IPRATROPIUM-ALBUTEROL 0.5-2.5 (3) MG/3ML IN SOLN
3.0000 mL | Freq: Three times a day (TID) | RESPIRATORY_TRACT | Status: DC
  Administered 2016-03-20 – 2016-03-23 (×9): 3 mL via RESPIRATORY_TRACT
  Filled 2016-03-20 (×9): qty 3

## 2016-03-20 MED ORDER — PREDNISONE 10 MG PO TABS
10.0000 mg | ORAL_TABLET | Freq: Every day | ORAL | Status: DC
  Administered 2016-03-20: 10 mg via ORAL
  Filled 2016-03-20: qty 1

## 2016-03-20 MED ORDER — DEXTROSE 5 % IV SOLN
500.0000 mg | INTRAVENOUS | Status: AC
  Administered 2016-03-21 – 2016-03-22 (×2): 500 mg via INTRAVENOUS
  Filled 2016-03-20 (×2): qty 500

## 2016-03-20 MED ORDER — ASPIRIN-DIPYRIDAMOLE ER 25-200 MG PO CP12
1.0000 | ORAL_CAPSULE | Freq: Two times a day (BID) | ORAL | Status: DC
  Administered 2016-03-21 – 2016-03-23 (×5): 1 via ORAL
  Filled 2016-03-20 (×6): qty 1

## 2016-03-20 MED ORDER — PREDNISONE 10 MG PO TABS: ORAL_TABLET | ORAL | Status: AC

## 2016-03-20 MED ORDER — ACETAMINOPHEN 325 MG PO TABS: 650.0000 mg | ORAL_TABLET | Freq: Four times a day (QID) | ORAL | Status: DC | PRN

## 2016-03-20 MED ORDER — SODIUM CHLORIDE 0.9% FLUSH
3.0000 mL | Freq: Two times a day (BID) | INTRAVENOUS | Status: DC
  Administered 2016-03-20 – 2016-03-23 (×6): 3 mL via INTRAVENOUS

## 2016-03-20 MED ORDER — ASPIRIN-DIPYRIDAMOLE ER 25-200 MG PO CP12: 1.0000 | ORAL_CAPSULE | Freq: Two times a day (BID) | ORAL | Status: AC

## 2016-03-20 MED ORDER — ALLOPURINOL 100 MG PO TABS
100.0000 mg | ORAL_TABLET | Freq: Every day | ORAL | Status: DC
  Administered 2016-03-20 – 2016-03-23 (×4): 100 mg via ORAL
  Filled 2016-03-20 (×4): qty 1

## 2016-03-20 MED ORDER — SIMVASTATIN 40 MG PO TABS
40.0000 mg | ORAL_TABLET | Freq: Every day | ORAL | Status: DC
  Administered 2016-03-20 – 2016-03-22 (×3): 40 mg via ORAL
  Filled 2016-03-20 (×3): qty 1

## 2016-03-20 MED ORDER — DEXTROSE 5 % IV SOLN
500.0000 mg | Freq: Once | INTRAVENOUS | Status: AC
  Administered 2016-03-20: 500 mg via INTRAVENOUS
  Filled 2016-03-20: qty 500

## 2016-03-20 MED ORDER — PREDNISONE 20 MG PO TABS
40.0000 mg | ORAL_TABLET | Freq: Every day | ORAL | Status: DC
  Administered 2016-03-21 – 2016-03-22 (×2): 40 mg via ORAL
  Filled 2016-03-20 (×2): qty 2

## 2016-03-20 MED ORDER — MAGNESIUM SULFATE 2 GM/50ML IV SOLN
2.0000 g | Freq: Once | INTRAVENOUS | Status: AC
  Administered 2016-03-20: 2 g via INTRAVENOUS
  Filled 2016-03-20: qty 50

## 2016-03-20 MED ORDER — DEXTROSE 5 % IV SOLN
1.0000 g | INTRAVENOUS | Status: DC
  Administered 2016-03-21 – 2016-03-22 (×2): 1 g via INTRAVENOUS
  Filled 2016-03-20 (×2): qty 10

## 2016-03-20 NOTE — Care Management Note (Addendum)
Case Management Note  Patient Details  Name: [name] MRN: [id number] Date of Birth: September 11, 1921  Subjective/Objective:                 Spoke with patient and family in the room. Her husband passed away recently and her family is in [place]. They had planned on taking her  to [place] on Sunday and she was admitted w/ PNA today. They hope for her to still be able to be released and travel on Sunday. If not they stated they would reschedule the flight. They state that she will be living in a home with multiple other family members. She will have an aide 24/7 and nursing and PT through out the week. She has a walker she plans on taking with her. Very supportive family, patient is happy to move to [place] and hopes to be discharged this weekend. No further CM needs identified at this time.    Action/Plan:  No CM needs. Patient will move to [place] immediately after discharge, [place] not necessary.   Addendum- Patient to DC to SNF as facilitated through [place], prior to moving to [place]. Home oxygen needs will need to assessed at the time of discharge from SNF.   Expected Discharge Date:  03/21/29               Expected Discharge Plan:  Home/Self Care  In-House Referral:     Discharge planning Services  CM Consult  Post Acute Care Choice:  NA Choice offered to:  Patient, Adult Children  DME Arranged:    DME Agency:     HH Arranged:    [place] Agency:     Status of Service:  Completed, signed off  Medicare Important Message Given:    Date Medicare IM Given:    Medicare IM give by:    Date Additional Medicare IM Given:    Additional Medicare Important Message give by:     If discussed at [place] of Stay Meetings, dates discussed:    Additional Comments:  Carles Collet, RN 03/20/2016, 1:44 PM

## 2016-03-20 NOTE — ED Notes (Signed)
Admitting at bedside 

## 2016-03-20 NOTE — Progress Notes (Signed)
Patient ID: [name], female   DOB: Nov 28, 1920, 82 y.o.   MRN: [id number]   Subjective: Ms. [name] feels her cough is about the same as yesterday but she does not feel short of breath. She had an isolated fever 3 days ago. She is quite sedentary at home but denies any leg pain or hemoptysis. She is a former smoker.   Objective: Vital signs in last 24 hours: Filed Vitals:   03/20/16 0700 03/20/16 0709 03/20/16 0730 03/20/16 0850  BP: 169/59  134/56 160/58  Pulse: 121  71 65  Temp:  99 F (37.2 C)  98.1 F (36.7 C)  TempSrc:  Rectal  Oral  Resp: 22  22 20   Weight:      SpO2: 100%  95% 100%   General: very friendly [place] lady resting in bed comfortably, appropriately conversational Cardiac: regular rate and rhythm, no rubs, murmurs or gallops Pulm: breathing well, end-expiratory wheezes in al lung fields with bibasilar ronchi Abd: bowel sounds normal, soft, nondistended, non-tender Ext: warm and well perfused, without pedal edema or calf tenderness Skin: greasy brown stuck-on papule on her left cheek Neuro: alert and oriented X3, cranial nerves II-XII grossly intact, moving all extremities well  Lab Results: Basic Metabolic Panel:  Recent Labs Lab 03/19/16 0243 03/19/16 2200  NA  --  137  K  --  4.7  CL  --  100*  CO2  --  20*  GLUCOSE  --  143*  BUN  --  69*  CREATININE  --  2.39*  CALCIUM  --  7.4*  MG 1.2*  --     CBC:  Recent Labs Lab 03/19/16 2200  WBC 22.1*  HGB 11.0*  HCT 32.9*  MCV 93.7  PLT 435*    Studies/Results: Dg Chest 2 View  03/20/2016  CLINICAL DATA:  Acute onset of cough and shortness of breath. Initial encounter. EXAM: CHEST  2 VIEW COMPARISON:  Chest radiograph performed 03/28/2009 FINDINGS: The lungs are well-aerated. Patchy right-sided and left basilar airspace opacities raise concern for multifocal pneumonia. There is no evidence of pleural effusion or pneumothorax. The heart is mildly enlarged. No acute osseous abnormalities  are seen. IMPRESSION: 1. Patchy right-sided and left basilar airspace opacities raise concern for multifocal pneumonia. Followup PA and lateral chest X-ray is recommended in 3-4 weeks following trial of antibiotic therapy to ensure resolution and exclude underlying malignancy. 2. Mild cardiomegaly. Electronically Signed   By: Garald Balding M.D.   On: 03/20/2016 02:01   Medications: I have reviewed the patient's current medications. Scheduled Meds: . allopurinol  100 mg Oral Daily  . [START ON 03/21/2016] azithromycin (ZITHROMAX) 500 MG IVPB  500 mg Intravenous Q24H  . benzonatate  100 mg Oral BID  . [START ON 03/21/2016] cefTRIAXone (ROCEPHIN)  IV  1 g Intravenous Q24H  . [START ON 03/21/2016] dipyridamole-aspirin  1 capsule Oral BID  . heparin  5,000 Units Subcutaneous Q8H  . ipratropium-albuterol  3 mL Nebulization Q4H  . pantoprazole  40 mg Oral Daily  . [START ON 03/21/2016] predniSONE  40 mg Oral Q breakfast  . simvastatin  40 mg Oral QHS  . sodium chloride flush  3 mL Intravenous Q12H   Continuous Infusions:  PRN Meds:.acetaminophen **OR** acetaminophen   Assessment/Plan:  Ms. [name] is a very friendly 82 year old [place] woman with CLL here with hypoxia from presumptive community-acquired pneumonia.  Acute hypoxia from presumptive community acquired pneumonia atop COPD exacerbation: This is not a clear-cut picture  of pneumonia; she does have a productive cough, but she is not febrile, does not have a leukocytosis, and she looks quite well. On her pulmonary exam, she has diffuse expiratory wheezes in addition to bibasar ronchi; along with a history of smoking, I think she has underlying an COPD exacerbation going on as well. We'll check a pro-calcitonin to help clear up her picture, and continue ceftriaxone and azithromycin, start DuoNebs, and increase her prednisone from 10 to 40mg  daily. At this point, I do not think she has a pulmonary embolus, but if she fails to improve with the  above-plan, we will consider checking a D-dimer. I'm hesitant to pursue a CTA given her poor renal function. -Checking procalcitonin -Started DuoNebs every 6 hours -Increased prednisone from 10 to 40mg  daily -Continue IV ceftriaxone and azithromycin for now -Keep oxygen saturation between 90-94% -Check oxygenation while ambulating tomorrow morning.  Acute on chronic kidney disease stage 3: I suspect this is pre-renal as evidenced by her concomitant increase in BUN. She was gently hydrated overnight; we'll follow her BMP today. -Follow BMP today -Continue holding antihypertensives  Hypertension: She's has slight systolic hypertension to [id number] but we'll continue to hold her antihypertensives. I suspect we'll probably drop her losartan dose upon discharge. -Hold home losartan 100 mg daily -Hold home furosemide 40 mg daily  Chronic lymphocytic leukemia: Her CBC appears stable for the last few years. She is on prednisone 10mg  daily and gets rituximab injections every 3 months. -Increased prednisone from 10 to 40mg  daily per above; will likely discharge  -Continue allopurinol 100 mg twice daily  Hyperlipidemia and right-sided carotid artery stenosis: -Continue home simvastatin 40 mg daily -Continue home Aggrenox 200-25 mg twice daily  Dispo: Disposition is deferred at this time, awaiting improvement of current medical problems.  Anticipated discharge in approximately 1-2 day(s).   The patient does have a current PCP (Tracy Mire, MD) and does need an [facility] hospital follow-up appointment after discharge.  The patient does not know have transportation limitations that hinder transportation to clinic appointments.  .Services Needed at time of discharge: Y = Yes, Blank = No PT:   OT:   RN:   Equipment:   Other:     LOS: 0 days   [name], MD 03/20/2016, 11:55 AM

## 2016-03-20 NOTE — ED Notes (Signed)
Patient transported to X-ray 

## 2016-03-20 NOTE — Discharge Summary (Signed)
Name: [name] MRN: [id number] DOB: [birth year] 82 y.o. PCP: Juluis Mire, MD  Date of Admission: 03/20/2016 12:22 AM Date of Discharge: 03/23/2016 Attending Physician: Sid Falcon, MD  Discharge Diagnosis: 1. Community-acquired pneumonia 2. Chronic lymphocytic leukemia 3. Hypertension  Discharge Medications:    Medication List    STOP taking these medications        losartan 100 MG tablet  Commonly known as:  COZAAR      TAKE these medications        acetaminophen 500 MG tablet  Commonly known as:  TYLENOL  Take 2 tablets (1,000 mg total) by mouth 2 (two) times daily as needed for mild pain.     allopurinol 100 MG tablet  Commonly known as:  ZYLOPRIM  Take 1 tablet (100 mg total) by mouth daily.     azithromycin 250 MG tablet  Commonly known as:  ZITHROMAX  Take 1 tablet (250 mg total) by mouth daily until May 4     cefpodoxime 200 MG tablet  Commonly known as:  VANTIN  Take 1 tablet (200 mg total) by mouth every 12 (twelve) hours until May 4.     dipyridamole-aspirin 200-25 MG 12hr capsule  Commonly known as:  AGGRENOX  Take 1 capsule by mouth 2 (two) times daily.     furosemide 40 MG tablet  Commonly known as:  LASIX  Take 0.5 tablets (20 mg total) by mouth daily.     omeprazole 20 MG capsule  Commonly known as:  PRILOSEC  Take 1 capsule (20 mg total) by mouth daily.     predniSONE 10 MG tablet  Commonly known as:  DELTASONE  TAKE 1 TABLET BY MOUTH ONCE DAILY WITH BREAKFAST     simvastatin 40 MG tablet  Commonly known as:  ZOCOR  Take 1 tablet (40 mg total) by mouth daily.        Disposition and follow-up:   [name] was discharged from [facility] in Good condition.  At the hospital follow up visit please address:  1. Check her oxygen saturation at rest  -If above 95%, she will not require oxygen -If below 92%, she will require oxygen -If between 92-95%, walk her; if below 90%, she will need  oxygen  Procedures Performed:  Dg Chest 2 View  03/20/2016  CLINICAL DATA:  Acute onset of cough and shortness of breath. Initial encounter. EXAM: CHEST  2 VIEW COMPARISON:  Chest radiograph performed 03/28/2009 FINDINGS: The lungs are well-aerated. Patchy right-sided and left basilar airspace opacities raise concern for multifocal pneumonia. There is no evidence of pleural effusion or pneumothorax. The heart is mildly enlarged. No acute osseous abnormalities are seen. IMPRESSION: 1. Patchy right-sided and left basilar airspace opacities raise concern for multifocal pneumonia. Followup PA and lateral chest X-ray is recommended in 3-4 weeks following trial of antibiotic therapy to ensure resolution and exclude underlying malignancy. 2. Mild cardiomegaly. Electronically Signed   By: Garald Balding M.D.   On: 03/20/2016 02:01   Ct Chest Wo Contrast  03/20/2016  ADDENDUM REPORT: 03/20/2016 16:58 ADDENDUM: Axial image 6 there is a intramuscular lipoma in left paraspinal muscle upper chest wall posteriorly, medial to left scapula measures at least 3.2 x 4.4 cm. Electronically Signed   By: Lahoma Crocker M.D.   On: 03/20/2016 16:58  03/20/2016  CLINICAL DATA:  Cough, infiltrates on chest x-ray, hypoxia EXAM: CT CHEST WITHOUT CONTRAST TECHNIQUE: Multidetector CT imaging of the chest was performed  following the standard protocol without IV contrast. COMPARISON:  Chest x-ray 03/20/2016 FINDINGS: Mediastinum/Lymph Nodes: Images of the thoracic inlet are unremarkable. Atherosclerotic calcifications of thoracic aorta and coronary arteries. A precarinal lymph node measures 1 cm short-axis borderline by size criteria. AP window lymph node measures 1 cm short-axis borderline by size criteria. There is no hilar adenopathy noted on this unenhanced scan. Heart size is within normal limits. Small hiatal hernia. No pericardial effusion. Mitral valve calcifications are noted. Lungs/Pleura: As noted on chest x-ray and bilateral  basilar posterior infiltrates are noted highly suspicious for bilateral pneumonia. Bronchitic changes are noted bilateral lower lobe. Patchy tree-in-bud infiltrates are also noted in superior segment bilateral lower lobe right upper lobe and right middle lobe. Superimposed atypical infection cannot be excluded. Clinical correlation is necessary. Upper abdomen: Unenhanced liver shows no biliary ductal dilatation. At least 2 calcified gallstones are noted within contracted gallbladder the largest measures 1.3 cm. Atherosclerotic calcifications of splenic artery and abdominal aorta. The visualized unenhanced pancreas is unremarkable. No adrenal gland mass is noted in visualized upper abdomen. Musculoskeletal: Sagittal images of the spine shows degenerative changes thoracic spine. Multilevel disc space flattening anterior spurring and vacuum disc phenomenon. Sagittal view of the sternum is unremarkable. IMPRESSION: 1. As noted on chest x-ray and bilateral basilar posterior infiltrates are noted highly suspicious for bilateral pneumonia. Bronchitic changes are noted bilateral lower lobe. Patchy tree-in-bud infiltrates are also noted in superior segment bilateral lower lobe right upper lobe and right middle lobe. Superimposed atypical infection cannot be excluded. Clinical correlation is necessary. 2. Borderline mediastinal adenopathy is noted.  No hilar adenopathy. 3. Atherosclerotic calcifications of thoracic aorta and coronary arteries. 4. Degenerative changes thoracic spine. 5. Cholelithiasis. Electronically Signed: By: Lahoma Crocker M.D. On: 03/20/2016 16:32   Admission HPI:   [name] is a pleasant 82 year old woman with PMH of CLL, CKD Stage 3 (baseline SCr ~2.0-2.20), T2DM, HLD, and HTN who presented to the ED with cough. She is accompanied by her niece who is here from [place] to bring Ms. [place] to [place] after the recent unfortunate passing of her husband. Patient reports a 10 day history of  non-productive cough. She was seen in [facility] on 03/13/16 for clearance for her international flight. At that time, her cough was attributed to seasonal allergies as she mentioned this happens every year with the increase in air pollen. She was recommended to try hot tea with honey and generic loratadine or ceterizine. Her symptoms persisted and she went to an urgent care clinic yesterday. They reported a CXR showed bilateral pneumonia and recommended she come to [facility] ED. Patient states that her cough is the same as before, not worsening. She does not have any productive sputum. Niece reports that patient had a low-grade fever 5 days ago, but none since then. Niece also reports that patient has decreased appetite at night, not eating dinner, but eats full breakfast and lunch. Patient denies any subjective fever, chills, diaphoresis, SOB, chest pain, sore throat, congestion, abdominal pain, nausea, vomiting, diarrhea, constipation, dysuria or change in urinary habits, or myalgias.  Patient was ambulated in the ED and noted to have oxygen desaturation to 80% and increased heart rate to 140s. CXR was repeated here as the outside imaging could not be accessed. This showed patchy right-sided and left basilar opacities suggestive of multifocal pneumonia. Blood cultures were collected and she was started on IV Ceftriaxone and Azithromycin.   Hospital Course by problem list:   1.  Community-acquired pneumonia: She presented with a productive cough for 1 week; chest x-ray showed bilateral lower lobe infiltrates, but a pro-calcitonin was normal, so we got a chest CT for further characterization given her history of CLL that indeed confirmed she has pneumonia. She was given IV ceftriaxone and azithromycin for 3 days and discharged on a total of 7 day course on cefpodoxime and azithromycin, stop date May 4. She was not requiring oxygen while ambulating. Advised to follow-up in our clinic next week before she flies home to  [place] some time next week.  2. Chronic lymphocytic leukemia: She was discharged home on her prednisone 10mg  daily and she received rituximab infusions every 3 months. Her leukocytosis was unchanged this admission.  3. Hypertension: On admission she had an acute kidney injury so her losartan and furosemide were held. She was normotensive and appeared euvolemic, so her losartan was held and furosemide decreased to 20mg  daily.  Discharge Vitals:   BP 137/44 mmHg  Pulse 75  Temp(Src) 97.4 F (36.3 C) (Oral)  Resp 18  Ht 4\' 7"  (1.397 m)  Wt 131 lb 9.8 oz (59.7 kg)  BMI 30.59 kg/m2  SpO2 91%  Discharge Labs:  No results found for this or any previous visit (from the past 24 hour(s)).  Signed: Maryellen Pile, MD 03/23/2016, 10:43 AM   Equipment Ordered on Discharge: None

## 2016-03-20 NOTE — ED Notes (Signed)
Pt walked with this RN and Tramaine NT using a walker while checking oxygen saturation level. The pts oxygen saturation level dropped to 80% while walking and her heart rate increased to 140 beats per minute. Dr. Betsey Holiday aware.

## 2016-03-20 NOTE — ED Provider Notes (Signed)
CSN: [id number]     Arrival date & time 03/19/16  2142 History  By signing my name below, I, [name], attest that this documentation has been prepared under the direction and in the presence of Orpah Greek, MD . Electronically Signed: Evelene Hampton, Scribe. 03/20/2016. 1:20 AM.  Chief Complaint  Patient presents with  . Pneumonia   The history is provided by the patient and a relative. No language interpreter was used.   HPI Comments:  [name] is a 82 y.o. female who presents to the Emergency Department complaining of persistent cough x 10 days. Pt was [facility] and was advised to come to the ED due to her age. Pt's daughter notes fever ~ 5 days ago which has resolved. Pt denies SOB and DOE; states she has been able to ambulate without difficulty. No alleviating factors noted.  Past Medical History  Diagnosis Date  . Diabetes mellitus   . Hyperlipidemia   . Hypertension   . Carotid stenosis, right 08/2005    40-60%  . History of colonic polyps 2002    Normal colonoscopy 08/2004  . CKD (chronic kidney disease) stage 2, GFR 60-89 ml/min   . CLL (chronic lymphocytic leukemia) (HCC)     Dr. [name]. s/p Rituxan  . Pulse irregularity     Etiology->frequent PACs, normal EKG   History reviewed. No pertinent past surgical history. No family history on file. Social History  Substance Use Topics  . Smoking status: Former Research scientist (life sciences)  . Smokeless tobacco: None  . Alcohol Use: None   OB History    No data available     Review of Systems  Constitutional: Positive for fever (resolved).  Respiratory: Positive for cough. Negative for shortness of breath.   All other systems reviewed and are negative.  Allergies  Review of patient's allergies indicates no known allergies.  Home Medications   Prior to Admission medications   Medication Sig Start Date End Date Taking? Authorizing Provider  acetaminophen (TYLENOL) 500 MG  tablet Take 1,000 mg by mouth 2 (two) times daily as needed. For pain    Historical Provider, MD  allopurinol (ZYLOPRIM) 100 MG tablet Take 1 tablet (100 mg total) by mouth daily. 02/07/15   Chauncey Cruel, MD  Calcium Carbonate-Vitamin D (CALCIUM PLUS VITAMIN D PO) Take 1 tablet by mouth 2 (two) times daily.    Historical Provider, MD  dipyridamole-aspirin (AGGRENOX) 200-25 MG per 12 hr capsule TAKE ONE CAPSULE BY MOUTH TWICE A DAY 05/28/15   Juluis Mire, MD  furosemide (LASIX) 40 MG tablet Take 1 tablet (40 mg total) by mouth daily. 03/13/16   Norval Gable, MD  losartan (COZAAR) 100 MG tablet Take 1 tablet (100 mg total) by mouth daily. 03/13/16   Norval Gable, MD  omeprazole (PRILOSEC) 20 MG capsule Take 1 capsule (20 mg total) by mouth daily. 06/07/15   Juluis Mire, MD  Polyethyl Glycol-Propyl Glycol (SYSTANE OP) Place 1 drop into both eyes 2 (two) times daily.    Historical Provider, MD  predniSONE (DELTASONE) 10 MG tablet TAKE 1 TABLET BY MOUTH ONCE DAILY WITH BREAKFAST 02/24/16   Chauncey Cruel, MD  simvastatin (ZOCOR) 40 MG tablet Take 1 tablet (40 mg total) by mouth at bedtime. 06/09/15   Marjan Rabbani, MD   BP 143/76 mmHg  Pulse 88  Temp(Src) 97.9 F (36.6 C) (Oral)  Resp 33  Wt 129 lb (58.514 kg)  SpO2 95%  Physical Exam  Constitutional: She is oriented to person, place, and time. She appears well-developed and well-nourished. No distress.  HENT:  Head: Normocephalic and atraumatic.  Right Ear: Hearing normal.  Left Ear: Hearing normal.  Nose: Nose normal.  Mouth/Throat: Oropharynx is clear and moist and mucous membranes are normal.  Eyes: Conjunctivae and EOM are normal. Pupils are equal, round, and reactive to light.  Neck: Normal range of motion. Neck supple.  Cardiovascular: Regular rhythm, S1 normal and S2 normal.  Exam reveals no gallop and no friction rub.   No murmur heard. Pulmonary/Chest: Effort normal. No respiratory distress. She has rales. She  exhibits no tenderness.  scattered rales at bases with decreased breathe sounds  Abdominal: Soft. Normal appearance and bowel sounds are normal. There is no hepatosplenomegaly. There is no tenderness. There is no rebound, no guarding, no tenderness at McBurney's point and negative Murphy's sign. No hernia.  Musculoskeletal: Normal range of motion.  Neurological: She is alert and oriented to person, place, and time. She has normal strength. No cranial nerve deficit or sensory deficit. Coordination normal. GCS eye subscore is 4. GCS verbal subscore is 5. GCS motor subscore is 6.  Skin: Skin is warm, dry and intact. No rash noted. No cyanosis.  Psychiatric: She has a normal mood and affect. Her speech is normal and behavior is normal. Thought content normal.  Nursing note and vitals reviewed.   ED Course  Procedures   DIAGNOSTIC STUDIES:  Oxygen Saturation is 95% on RA, adequate by my interpretation.    COORDINATION OF CARE:  1:00 AM Discussed treatment plan with pt at bedside and pt agreed to plan.  Labs Review Labs Reviewed  CBC - Abnormal; Notable for the following:    WBC 22.1 (*)    RBC 3.51 (*)    Hemoglobin 11.0 (*)    HCT 32.9 (*)    Platelets 435 (*)    All other components within normal limits  BASIC METABOLIC PANEL - Abnormal; Notable for the following:    Chloride 100 (*)    CO2 20 (*)    Glucose, Bld 143 (*)    BUN 69 (*)    Creatinine, Ser 2.39 (*)    Calcium 7.4 (*)    GFR calc non Af Amer 17 (*)    GFR calc Af Amer 19 (*)    Anion gap 17 (*)    All other components within normal limits  CULTURE, BLOOD (ROUTINE X 2)  CULTURE, BLOOD (ROUTINE X 2)  I-STAT CG4 LACTIC ACID, ED    Imaging Review Dg Chest 2 View  03/20/2016  CLINICAL DATA:  Acute onset of cough and shortness of breath. Initial encounter. EXAM: CHEST  2 VIEW COMPARISON:  Chest radiograph performed 03/28/2009 FINDINGS: The lungs are well-aerated. Patchy right-sided and left basilar airspace  opacities raise concern for multifocal pneumonia. There is no evidence of pleural effusion or pneumothorax. The heart is mildly enlarged. No acute osseous abnormalities are seen. IMPRESSION: 1. Patchy right-sided and left basilar airspace opacities raise concern for multifocal pneumonia. Followup PA and lateral chest X-ray is recommended in 3-4 weeks following trial of antibiotic therapy to ensure resolution and exclude underlying malignancy. 2. Mild cardiomegaly. Electronically Signed   By: Garald Balding M.D.   On: 03/20/2016 02:01   I have personally reviewed and evaluated these images and lab results as part of my medical decision-making.   EKG Interpretation None      MDM   Final diagnoses:  CAP (  community acquired pneumonia)  Acute respiratory failure with hypoxia [facility])    Patient presented to the ER as a referral from urgent care. Patient has been sick for 10 days. She reports that she has had cough and congestion and symptoms are worsening. X-ray at urgent care today revealed bilateral pneumonia. I was unable to access the x-ray from urgent care, x-ray was repeated here in the ER. This did confirm multifocal pneumonia.  Patient appears slightly short of breath at rest. She was ambulated in the hallway, however, and vital signs significantly worsened. Her room air oxygen saturation dropped to 80% and heart rate went up to 140 bpm while ambulating. Patient was placed back in the room, placed on supplemental oxygen and will be admitted for further management of community-acquired pneumonia. She was initiated on Rocephin and Zithromax here in the ER.  I personally performed the services described in this documentation, which was scribed in my presence. The recorded information has been reviewed and is accurate.    Orpah Greek, MD 03/20/16 Rogene [place]

## 2016-03-20 NOTE — H&P (Signed)
Date: 03/20/2016               Patient Name:  [name] MRN: [id number]  DOB: [birth year] Age / Sex: 82 y.o., female   PCP: Juluis Mire, MD         Medical Service: Internal Medicine Teaching Service         Attending Physician: Dr. Aldine Contes, MD    First Contact: Dr. [name] Pager: [phone]  Second Contact: Dr. Charlott Rakes Pager: [phone]       After Hours (After 5p/  First Contact Pager: [phone]  weekends / holidays): Second Contact Pager: [phone]   Chief Complaint: Cough  History of Present Illness:  [name] is a pleasant 82 year old woman with PMH of CLL, CKD Stage 3 (baseline SCr ~2.0-2.20), T2DM, HLD, and HTN who presented to the ED with cough. She is accompanied by her niece who is here from [place] to bring [name] to [place] after the recent unfortunate passing of her husband. Patient reports a 10 day history of non-productive cough. She was seen in [facility] on 03/13/16 for clearance for her international flight. At that time, her cough was attributed to seasonal allergies as she mentioned this happens every year with the increase in air pollen. She was recommended to try hot tea with honey and generic loratadine or ceterizine. Her symptoms persisted and she went to an urgent care clinic yesterday. They reported a CXR showed bilateral pneumonia and recommended she come to [facility] ED. Patient states that her cough is the same as before, not worsening. She does not have any productive sputum. Niece reports that patient had a low-grade fever 5 days ago, but none since then. Niece also reports that patient has decreased appetite at night, not eating dinner, but eats full breakfast and lunch. Patient denies any subjective fever, chills, diaphoresis, SOB, chest pain, sore throat, congestion, abdominal pain, nausea, vomiting, diarrhea, constipation, dysuria or change in urinary habits, or myalgias.  Patient was ambulated in the ED and noted to have oxygen  desaturation to 80% and increased heart rate to 140s. CXR was repeated here as the outside imaging could not be accessed. This showed patchy right-sided and left basilar opacities suggestive of multifocal pneumonia. Blood cultures were collected and she was started on IV Ceftriaxone and Azithromycin.    Meds: Current Facility-Administered Medications  Medication Dose Route Frequency Provider Last Rate Last Dose  . 0.9 %  sodium chloride infusion   Intravenous Continuous Marjan Rabbani, MD      . acetaminophen (TYLENOL) tablet 650 mg  650 mg Oral Q6H PRN Juluis Mire, MD       Or  . acetaminophen (TYLENOL) suppository 650 mg  650 mg Rectal Q6H PRN Juluis Mire, MD      . allopurinol (ZYLOPRIM) tablet 100 mg  100 mg Oral Daily Marjan Rabbani, MD      . Derrill Memo ON 03/21/2016] azithromycin (ZITHROMAX) 500 mg in dextrose 5 % 250 mL IVPB  500 mg Intravenous Q24H Marjan Rabbani, MD      . benzonatate (TESSALON) capsule 100 mg  100 mg Oral BID Juluis Mire, MD      . Derrill Memo ON 03/21/2016] cefTRIAXone (ROCEPHIN) 1 g in dextrose 5 % 50 mL IVPB  1 g Intravenous Q24H Marjan Rabbani, MD      . Derrill Memo ON 03/21/2016] dipyridamole-aspirin (AGGRENOX) 200-25 MG per 12 hr capsule 1 capsule  1 capsule Oral BID Juluis Mire, MD      .  heparin injection 5,000 Units  5,000 Units Subcutaneous Q8H Marjan Rabbani, MD      . pantoprazole (PROTONIX) EC tablet 40 mg  40 mg Oral Daily Marjan Rabbani, MD      . predniSONE (DELTASONE) tablet 10 mg  10 mg Oral Q breakfast Marjan Rabbani, MD      . simvastatin (ZOCOR) tablet 40 mg  40 mg Oral QHS Marjan Rabbani, MD      . sodium chloride flush (NS) 0.9 % injection 3 mL  3 mL Intravenous Q12H Juluis Mire, MD       Current Outpatient Prescriptions  Medication Sig Dispense Refill  . acetaminophen (TYLENOL) 500 MG tablet Take 2 tablets (1,000 mg total) by mouth 2 (two) times daily as needed for mild pain. 60 tablet 0  . allopurinol (ZYLOPRIM) 100 MG tablet Take 1 tablet  (100 mg total) by mouth daily. 90 tablet 0  . dipyridamole-aspirin (AGGRENOX) 200-25 MG 12hr capsule Take 1 capsule by mouth 2 (two) times daily. 180 capsule 0  . furosemide (LASIX) 40 MG tablet Take 1 tablet (40 mg total) by mouth daily. 90 tablet 0  . losartan (COZAAR) 100 MG tablet Take 1 tablet (100 mg total) by mouth daily. 90 tablet 0  . omeprazole (PRILOSEC) 20 MG capsule Take 1 capsule (20 mg total) by mouth daily. 90 capsule 0  . predniSONE (DELTASONE) 10 MG tablet TAKE 1 TABLET BY MOUTH ONCE DAILY WITH BREAKFAST 90 tablet 0  . simvastatin (ZOCOR) 40 MG tablet Take 1 tablet (40 mg total) by mouth daily. 90 tablet 0    Allergies: Allergies as of 03/19/2016  . (No Known Allergies)   Past Medical History  Diagnosis Date  . Diabetes mellitus   . Hyperlipidemia   . Hypertension   . Carotid stenosis, right 08/2005    40-60%  . History of colonic polyps 2002    Normal colonoscopy 08/2004  . CKD (chronic kidney disease) stage 2, GFR 60-89 ml/min   . CLL (chronic lymphocytic leukemia) (HCC)     Dr. [name]. s/p Rituxan  . Pulse irregularity     Etiology->frequent PACs, normal EKG   History reviewed. No pertinent past surgical history. No family history on file. Social History   Social History  . Marital Status: Married    Spouse Name: N/A  . Number of Children: N/A  . Years of Education: N/A   Occupational History  . Not on file.   Social History Main Topics  . Smoking status: Former Research scientist (life sciences)  . Smokeless tobacco: Not on file  . Alcohol Use: Not on file  . Drug Use: Not on file  . Sexual Activity: Not on file   Other Topics Concern  . Not on file   Social History Narrative    Review of Systems: Review of Systems  Constitutional: Negative for fever, chills, malaise/fatigue and diaphoresis.  HENT: Negative for sore throat.   Respiratory: Positive for cough. Negative for hemoptysis, sputum production, shortness of breath and wheezing.   Cardiovascular: Negative  for chest pain, palpitations and leg swelling.  Gastrointestinal: Negative for nausea, vomiting, abdominal pain, diarrhea and constipation.  Genitourinary: Negative for dysuria and frequency.  Musculoskeletal: Negative for myalgias and falls.  Neurological: Negative for dizziness, focal weakness, weakness and headaches.     Physical Exam: Blood pressure 150/85, pulse 40, temperature 97.9 F (36.6 C), temperature source Oral, resp. rate 17, weight 129 lb (58.514 kg), SpO2 96 %. Physical Exam  Constitutional: She is oriented to person, place,  and time. No distress.  Pleasant elderly woman, resting in bed, no acute distress  HENT:  Head: Normocephalic and atraumatic.  Mouth/Throat: Oropharynx is clear and moist.  Eyes: EOM are normal. Pupils are equal, round, and reactive to light.  Neck: Neck supple.  Cardiovascular: Normal rate.   No murmur heard. Irregularly irregular  Pulmonary/Chest: No respiratory distress. She has no wheezes. She exhibits no tenderness.  Bibasilar rales with diminished breath sounds, intermittent coughing  Abdominal: Soft. Bowel sounds are normal. She exhibits no distension. There is no tenderness.  Musculoskeletal: She exhibits no edema or tenderness.  Lymphadenopathy:    She has no cervical adenopathy.  Neurological: She is alert and oriented to person, place, and time.  Skin: Skin is warm. She is not diaphoretic.  Psychiatric: She has a normal mood and affect.     Lab results: Basic Metabolic Panel:  Recent Labs  03/19/16 0243 03/19/16 2200  NA  --  137  K  --  4.7  CL  --  100*  CO2  --  20*  GLUCOSE  --  143*  BUN  --  69*  CREATININE  --  2.39*  CALCIUM  --  7.4*  MG 1.2*  --    Liver Function Tests: No results for input(s): AST, ALT, ALKPHOS, BILITOT, PROT, ALBUMIN in the last 72 hours. No results for input(s): LIPASE, AMYLASE in the last 72 hours. No results for input(s): AMMONIA in the last 72 hours. CBC:  Recent Labs   03/19/16 2200  WBC 22.1*  HGB 11.0*  HCT 32.9*  MCV 93.7  PLT 435*   Cardiac Enzymes: No results for input(s): CKTOTAL, CKMB, CKMBINDEX, TROPONINI in the last 72 hours. BNP: No results for input(s): PROBNP in the last 72 hours. D-Dimer: No results for input(s): DDIMER in the last 72 hours. CBG: No results for input(s): GLUCAP in the last 72 hours. Hemoglobin A1C: No results for input(s): HGBA1C in the last 72 hours. Fasting Lipid Panel: No results for input(s): CHOL, HDL, LDLCALC, TRIG, CHOLHDL, LDLDIRECT in the last 72 hours. Thyroid Function Tests: No results for input(s): TSH, T4TOTAL, FREET4, T3FREE, THYROIDAB in the last 72 hours. Anemia Panel: No results for input(s): VITAMINB12, FOLATE, FERRITIN, TIBC, IRON, RETICCTPCT in the last 72 hours. Coagulation: No results for input(s): LABPROT, INR in the last 72 hours. Urine Drug Screen: Drugs of Abuse  No results found for: LABOPIA, COCAINSCRNUR, LABBENZ, AMPHETMU, THCU, LABBARB  Alcohol Level: No results for input(s): ETH in the last 72 hours. Urinalysis: No results for input(s): COLORURINE, LABSPEC, PHURINE, GLUCOSEU, HGBUR, BILIRUBINUR, KETONESUR, PROTEINUR, UROBILINOGEN, NITRITE, LEUKOCYTESUR in the last 72 hours.  Invalid input(s): APPERANCEUR   Imaging results:  Dg Chest 2 View  03/20/2016  CLINICAL DATA:  Acute onset of cough and shortness of breath. Initial encounter. EXAM: CHEST  2 VIEW COMPARISON:  Chest radiograph performed 03/28/2009 FINDINGS: The lungs are well-aerated. Patchy right-sided and left basilar airspace opacities raise concern for multifocal pneumonia. There is no evidence of pleural effusion or pneumothorax. The heart is mildly enlarged. No acute osseous abnormalities are seen. IMPRESSION: 1. Patchy right-sided and left basilar airspace opacities raise concern for multifocal pneumonia. Followup PA and lateral chest X-ray is recommended in 3-4 weeks following trial of antibiotic therapy to ensure  resolution and exclude underlying malignancy. 2. Mild cardiomegaly. Electronically Signed   By: Garald Balding M.D.   On: 03/20/2016 02:01    Other results: EKG: n/a  Assessment & Plan by Problem: Principal Problem:  Community acquired pneumonia Active Problems:   Type 2 diabetes mellitus with stage 3 chronic kidney disease (HCC)   Hyperlipidemia   Essential hypertension   CKD (chronic kidney disease) stage 3, GFR 30-59 ml/min   CLL (chronic lymphocytic leukemia) (HCC)   Gastroesophageal reflux disease without esophagitis   Chronic anemia   Acute respiratory failure with hypoxia (HCC)   Dispo: Disposition is deferred at this time, awaiting improvement of current medical problems. Anticipated discharge in approximately 1-2 day(s).  82 year old woman with PMH of CLL, CKD Stage 3 (baseline SCr ~2.0-2.20), T2DM, HLD, and HTN who presented to the ED with multifocal pneumonia.  Acute Hypoxia 2/2 CAP: Immunocompromised patient with 10 day history of non-productive cough, hypoxia on ambulation, and imaging suggestive of multifocal pneumonia. Her WBC is elevated to 22.1 which is most likely related to her CLL and also seems near her baseline. She has no other systemic signs of infection. Patient with plans to move to [place] to be with family in 2 days, but this will likely need to be put on hold for now. She is at risk for worsening hypoxia while flying and may need portable supplemental oxygen to take with her. Will continue IV antibiotics for CAP treatment for now and transition to oral medication on discharge.  -Continue IV Azithromycin 500 mg daily for 3 total doses -Continue IV Ceftriaxone 1 gram daily -Transition to oral antibiotics on discharge -Check Strep pneumo, Legionella, sputum Cx -Ambulate in AM while monitoring pulse ox -May need supplemental oxygen when flying -Tessalon for cough -Tylenol prn for fever/pain -Repeat CXR in 4-6 weeks in [place]   CKD Stage 3: SCr baseline  appears to be between 2.00-2.20. It is slightly increased, but would not consider this an AKI. She has decreased appetite recently which likely contributed. Will hold home HTN meds for now and give gentle IVF. -IV NS @ 75 mL/hr for 6 hours -Hold HTN meds for now -f/u BMP at 1000   HTN: Pressures currently stable, holding home meds this morning with slight increase in SCr, can likely resume tomorrow. -Hold home Losartan 100 mg daily -Hold home Lasix 40 mg daily   CLL: Diagnosed in November 2001, has been following with Dr. [name] of Oncology and tolerating Rituximab every 8 weeks recently, last planned dose in the [place] given on 03/05/16. She is on low dose prednisone. -Continue Prednisone 10 mg po daily -Continue Allopurinol 100 mg po BID  HLD and Right sided Carotid artery stenosis: -Continue home Simvastatin 40 mg daily -Continue home Aggrenox 200-25 mg po BID  T2DM: Hgb A1C 6.9 on 03/13/16, not on medications for this -Diet controlled  Diet: Carb  DVT ppx: Heparin  Code: FULL -Chart review shows discussions in 2015 were leaning towards DNR, but patient reports wanting CPR/intubation at this time when asked by [place], PCP, and niece.   The patient does have a current PCP (Juluis Mire, MD) and does need an [facility] hospital follow-up appointment after discharge.  The patient does not have transportation limitations that hinder transportation to clinic appointments.  Signed: Zada Finders, MD 03/20/2016, 4:47 AM

## 2016-03-21 LAB — BASIC METABOLIC PANEL
ANION GAP: 12 (ref 5–15)
BUN: 49 mg/dL — ABNORMAL HIGH (ref 6–20)
CHLORIDE: 107 mmol/L (ref 101–111)
CO2: 22 mmol/L (ref 22–32)
Calcium: 7.3 mg/dL — ABNORMAL LOW (ref 8.9–10.3)
Creatinine, Ser: 1.7 mg/dL — ABNORMAL HIGH (ref 0.44–1.00)
GFR calc non Af Amer: 25 mL/min — ABNORMAL LOW (ref >60)
GFR, EST AFRICAN AMERICAN: 29 mL/min — AB (ref >60)
Glucose, Bld: 120 mg/dL — ABNORMAL HIGH (ref 65–99)
POTASSIUM: 3.6 mmol/L (ref 3.5–5.1)
SODIUM: 141 mmol/L (ref 135–145)

## 2016-03-21 LAB — MAGNESIUM: Magnesium: 2 mg/dL (ref 1.7–2.4)

## 2016-03-21 NOTE — Progress Notes (Signed)
Patient ID: [name], female   DOB: 1921-05-12, 82 y.o.   MRN: [id number]   Subjective: [name] is still coughing but does not feel short of breath and has been quite hungry. She was weaned off of oxygen overnight without any problem and has been oxygenating at 95%.   Objective: Vital signs in last 24 hours: Filed Vitals:   03/20/16 1422 03/20/16 2118 03/20/16 2259 03/21/16 0505  BP:   143/58 142/56  Pulse:   76 91  Temp:   98.1 F (36.7 C) 98 F (36.7 C)  TempSrc:      Resp:   18 16  Height:      Weight:    131 lb 9.8 oz (59.7 kg)  SpO2: 93% 93% 95% 95%   General: friendly elderly lady resting in bed comfortably, appropriately conversational Cardiac: regular rate and rhythm, no rubs, murmurs or gallops Pulm: breathing well, bibasilar ronchi, wheezing has improved Abd: bowel sounds normal, soft, nondistended, non-tender Ext: warm and well perfused, without pedal edema  Studies/Results: Ct Chest Wo Contrast  03/20/2016  ADDENDUM REPORT: 03/20/2016 16:58 ADDENDUM: Axial image 6 there is a intramuscular lipoma in left paraspinal muscle upper chest wall posteriorly, medial to left scapula measures at least 3.2 x 4.4 cm. Electronically Signed   By: Lahoma Crocker M.D.   On: 03/20/2016 16:58  03/20/2016  CLINICAL DATA:  Cough, infiltrates on chest x-ray, hypoxia EXAM: CT CHEST WITHOUT CONTRAST TECHNIQUE: Multidetector CT imaging of the chest was performed following the standard protocol without IV contrast. COMPARISON:  Chest x-ray 03/20/2016 FINDINGS: Mediastinum/Lymph Nodes: Images of the thoracic inlet are unremarkable. Atherosclerotic calcifications of thoracic aorta and coronary arteries. A precarinal lymph node measures 1 cm short-axis borderline by size criteria. AP window lymph node measures 1 cm short-axis borderline by size criteria. There is no hilar adenopathy noted on this unenhanced scan. Heart size is within normal limits. Small hiatal hernia. No pericardial effusion.  Mitral valve calcifications are noted. Lungs/Pleura: As noted on chest x-ray and bilateral basilar posterior infiltrates are noted highly suspicious for bilateral pneumonia. Bronchitic changes are noted bilateral lower lobe. Patchy tree-in-bud infiltrates are also noted in superior segment bilateral lower lobe right upper lobe and right middle lobe. Superimposed atypical infection cannot be excluded. Clinical correlation is necessary. Upper abdomen: Unenhanced liver shows no biliary ductal dilatation. At least 2 calcified gallstones are noted within contracted gallbladder the largest measures 1.3 cm. Atherosclerotic calcifications of splenic artery and abdominal aorta. The visualized unenhanced pancreas is unremarkable. No adrenal gland mass is noted in visualized upper abdomen. Musculoskeletal: Sagittal images of the spine shows degenerative changes thoracic spine. Multilevel disc space flattening anterior spurring and vacuum disc phenomenon. Sagittal view of the sternum is unremarkable. IMPRESSION: 1. As noted on chest x-ray and bilateral basilar posterior infiltrates are noted highly suspicious for bilateral pneumonia. Bronchitic changes are noted bilateral lower lobe. Patchy tree-in-bud infiltrates are also noted in superior segment bilateral lower lobe right upper lobe and right middle lobe. Superimposed atypical infection cannot be excluded. Clinical correlation is necessary. 2. Borderline mediastinal adenopathy is noted.  No hilar adenopathy. 3. Atherosclerotic calcifications of thoracic aorta and coronary arteries. 4. Degenerative changes thoracic spine. 5. Cholelithiasis. Electronically Signed: By: Lahoma Crocker M.D. On: 03/20/2016 16:32   Medications: I have reviewed the patient's current medications. Scheduled Meds: . allopurinol  100 mg Oral Daily  . azithromycin (ZITHROMAX) 500 MG IVPB  500 mg Intravenous Q24H  . benzonatate  100 mg Oral BID  .  cefTRIAXone (ROCEPHIN)  IV  1 g Intravenous Q24H  .  dipyridamole-aspirin  1 capsule Oral BID  . heparin  5,000 Units Subcutaneous Q8H  . ipratropium-albuterol  3 mL Nebulization TID  . pantoprazole  40 mg Oral Daily  . predniSONE  40 mg Oral Q breakfast  . simvastatin  40 mg Oral QHS  . sodium chloride flush  3 mL Intravenous Q12H   Continuous Infusions:  PRN Meds:.acetaminophen **OR** acetaminophen   Assessment/Plan:  [name] is a very friendly 82 year old [place] woman with CLL here with cough and hypoxia from community-acquired pneumonia.  Hypoxia from community-acquired pneumonia and COPD exacerbation: Yesterday a chest CT affirmed this is indeed pneumonia and nothing else. She appears to be clinically-improving but she's still desaturating to the low 80s when walking. Because she was planning on flying to [place] tomorrow, I've recommended we keep her tonight and continue IV antibiotics for another day, discharge her home on oral antibiotics and oxygen when walking, then have her follow in our clinic on Monday or Tuesday to see if she needs oxygen before flying.  -Continue IV ceftriaxone and azithromycin for now -Will discharge on cefuroxime and azithromycin tomorrow -Continue prednisone 40mg  daily  Acute on chronic kidney disease stage 3: This has resolved with gentle rehydration; she's back to her baseline renal function. -Continue holding antihypertensives  Hypertension: Her pressures are fine in the 140s/50s without any antihypertensives. Upon discharge we will likely stop her losartan and drop her furosemide to 20mg  daily. -Holding home losartan 100 mg daily -Holding home furosemide 40 mg daily  Dispo: Disposition is deferred at this time, awaiting improvement of current medical problems.  Anticipated discharge in approximately 0-1 day(s).   The patient does have a current PCP (Juluis Mire, MD) and does not need an [facility] hospital follow-up appointment after discharge.  The patient does not know have transportation  limitations that hinder transportation to clinic appointments.  .Services Needed at time of discharge: Y = Yes, Blank = No PT:   OT:   RN:   Equipment:   Other:     LOS: 1 day   [name], MD 03/21/2016, 7:20 AM

## 2016-03-21 NOTE — Progress Notes (Addendum)
Pt ambulated from her room around the circle. O2 sats 87 at start and 83 by end of walk. Checked O2 sats again 15 min later while sitting and was 91.   Rechecked again 11:53 O2 sat 88. Added 1L O2. Will recheck again in an hour.   O2 sats with 1L 96%

## 2016-03-21 NOTE — Progress Notes (Signed)
Patient seen and examined. Case d/w residents in detail. I agree with findings and plan as documented in Dr. Melburn Hake' note.  Patient is much improved today but still with episodes of desaturation on ambulation. Will c/w IV ceftriaxone and azithromycin for now. Likely d/c on PO abx in AM. Will likely need home O2 on d/c. I do not recommend flying given the PNA and recommend postponing travel for a week so she can recover prior to her journey. She should follow up in [facility] early next week.

## 2016-03-22 MED ORDER — CEFPODOXIME PROXETIL 200 MG PO TABS: 200.0000 mg | ORAL_TABLET | Freq: Two times a day (BID) | ORAL | Status: DC

## 2016-03-22 MED ORDER — AZITHROMYCIN 500 MG PO TABS
250.0000 mg | ORAL_TABLET | Freq: Every day | ORAL | Status: DC
  Administered 2016-03-22 – 2016-03-23 (×2): 250 mg via ORAL
  Filled 2016-03-22 (×2): qty 1

## 2016-03-22 MED ORDER — CEFPODOXIME PROXETIL 200 MG PO TABS
200.0000 mg | ORAL_TABLET | ORAL | Status: DC
  Administered 2016-03-22: 200 mg via ORAL
  Filled 2016-03-22 (×2): qty 1

## 2016-03-22 MED ORDER — GUAIFENESIN ER 600 MG PO TB12
600.0000 mg | ORAL_TABLET | Freq: Two times a day (BID) | ORAL | Status: DC | PRN
  Administered 2016-03-22: 600 mg via ORAL
  Filled 2016-03-22: qty 1

## 2016-03-22 MED ORDER — AZITHROMYCIN 250 MG PO TABS: ORAL_TABLET | ORAL | Status: AC

## 2016-03-22 MED ORDER — CEFPODOXIME PROXETIL 200 MG PO TABS: ORAL_TABLET | ORAL | Status: AC

## 2016-03-22 MED ORDER — PREDNISONE 10 MG PO TABS
10.0000 mg | ORAL_TABLET | Freq: Every day | ORAL | Status: DC
Start: 2016-03-23 — End: 2016-03-23
  Administered 2016-03-23: 10 mg via ORAL
  Filled 2016-03-22: qty 1

## 2016-03-22 MED ORDER — FUROSEMIDE 40 MG PO TABS: 20.0000 mg | ORAL_TABLET | Freq: Every day | ORAL | Status: AC

## 2016-03-22 NOTE — Progress Notes (Signed)
Patient seen and examined. Case d/w residents in detail. I agree with findings and plan as documented in Dr. Melburn Hake' note.  She is much improved today. Will change abx to PO to complete 7 day course. Patient will likely need SNF on d/c. Awaiting PT eval. Stable for d/c today to home v/s SNF. She will follow up in [facility] next week.

## 2016-03-22 NOTE — Progress Notes (Signed)
Patient ID: [name], female   DOB: 28-Oct-1921, 82 y.o.   MRN: [id number]   Subjective: [name] is feeling great this morning. She is still coughing but this is improving; she denies dyspnea. Her nephew is requesting she go to [place] upon discharge.  Objective: Vital signs in last 24 hours: Filed Vitals:   03/21/16 1531 03/21/16 2325 03/22/16 0642 03/22/16 0844  BP:  135/42 137/50   Pulse:  72 72 74  Temp:  97.9 F (36.6 C) 98.1 F (36.7 C)   TempSrc:  Oral Oral   Resp:  18 18 18   Height:      Weight:      SpO2: 95% 95% 96% 94%   General: resting in bed comfortably, appropriately conversational Cardiac: regular rate and rhythm, no rubs, murmurs or gallops Pulm: breathing well, bibasilar ronchi improved from yesterday, no wheezing Abd: bowel sounds normal, soft, nondistended, non-tender Ext: warm and well perfused, without pedal edema  Medications: I have reviewed the patient's current medications. Scheduled Meds: . allopurinol  100 mg Oral Daily  . benzonatate  100 mg Oral BID  . cefTRIAXone (ROCEPHIN)  IV  1 g Intravenous Q24H  . dipyridamole-aspirin  1 capsule Oral BID  . heparin  5,000 Units Subcutaneous Q8H  . ipratropium-albuterol  3 mL Nebulization TID  . pantoprazole  40 mg Oral Daily  . predniSONE  40 mg Oral Q breakfast  . simvastatin  40 mg Oral QHS  . sodium chloride flush  3 mL Intravenous Q12H   Continuous Infusions:  PRN Meds:.acetaminophen **OR** acetaminophen, guaiFENesin   Assessment/Plan:  [name] is a very friendly 82 year old [place] woman with CLL here with cough and hypoxia from community-acquired pneumonia.  Hypoxia from community-acquired pneumonia and COPD exacerbation: She has improved dramatically on IV antibiotics. We'll walk her today to see if she requires oxygen while ambulating, and transition her to oral antibiotics for a total of 7 day course given her elderly age. She's clear for discharge to home vs. SNF today  pending PT evaluation. I've requested an appointment in our clinic next week. -Stopped IV ceftriaxone and azithromycin -Started cefpoxoxime 200mg  twice daily, stop date 5/4 -Started azithromycin 250mg  daily, stop date 5/4 -Decreased prednisone from 40 to 10mg  daily for CLL  Acute on chronic kidney disease stage 3: This has resolved with gentle rehydration; she's back to her baseline renal function. -Continue holding antihypertensives  Hypertension: Her pressures are fine in the 150s without any antihypertensives. Upon discharge we will likely stop her losartan and drop her furosemide to 20mg  daily. -Holding home losartan 100 mg daily -Will discharge on furosemide 20mg  daily  Dispo: Disposition is deferred at this time, awaiting improvement of current medical problems.  Anticipated discharge in approximately 0 day(s).   The patient does have a current PCP (Juluis Mire, MD) and does need an [facility] hospital follow-up appointment after discharge.  The patient does have transportation limitations that hinder transportation to clinic appointments.  .Services Needed at time of discharge: Y = Yes, Blank = No PT:   OT:   RN:   Equipment:   Other:     LOS: 2 days   [name], MD 03/22/2016, 11:19 AM

## 2016-03-22 NOTE — Progress Notes (Addendum)
OXYGEN SATURATION   Patient Saturations on Room Air at Rest = 96%  Patient Saturations on Room Air while Ambulating = 84%  PT to work with pt this PM and will walk again with O2.

## 2016-03-22 NOTE — Clinical Social Work Placement (Signed)
   CLINICAL SOCIAL WORK PLACEMENT  NOTE  Date:  03/22/2016  Patient Details  Name: [name] MRN: [id number] Date of Birth: June 22, 1921  Clinical Social Work is seeking post-discharge placement for this patient at the [place] level of care (*CSW will initial, date and re-position this form in  chart as items are completed):  Yes   Patient/family provided with [place] Work Department's list of facilities offering this level of care within the geographic area requested by the patient (or if unable, by the patient's family).  Yes   Patient/family informed of their freedom to choose among providers that offer the needed level of care, that participate in Medicare, Medicaid or managed care program needed by the patient, have an available bed and are willing to accept the patient.  Yes   Patient/family informed of 's ownership interest in [facility] and [facility], as well as of the fact that they are under no obligation to receive care at these facilities.  PASRR submitted to EDS on 03/22/16     PASRR number received on 03/22/16     Existing PASRR number confirmed on       FL2 transmitted to all facilities in geographic area requested by pt/family on 03/22/16     FL2 transmitted to all facilities within larger geographic area on       Patient informed that his/her managed care company has contracts with or will negotiate with certain facilities, including the following:            Patient/family informed of bed offers received.  Patient chooses bed at       Physician recommends and patient chooses bed at      Patient to be transferred to   on  .  Patient to be transferred to facility by       Patient family notified on   of transfer.  Name of family member notified:        PHYSICIAN       Additional Comment:    _______________________________________________ Boone Master, [place] 03/22/2016, 4:02 PM

## 2016-03-22 NOTE — Progress Notes (Signed)
CM received call from MD requesting discharge planning to SNF (preferably today).  CM placed Imminent PT/OT eval consult and called CSW Joedy/Kelly to please expedite SNF plan.  No other CM needs were communicated.

## 2016-03-22 NOTE — Evaluation (Signed)
Physical Therapy Evaluation Patient Details Name: [name] MRN: [id number] DOB: 04-17-1921 Today's Date: 03/22/2016   History of Present Illness  patient is a 82 y/o female with PMH of CLL, CKD stage 3, DM, HLD, HTN who p/w persistent cough. Patient was seen in [facility] for non productive cough on 4/21 and it was initially thought to be secondary to seasonal allergies. cough persisted, thoguh, and later she presented to urgent care where chest x-ray was positive for pneumonia  Clinical Impression   Pt admitted with above diagnosis. Pt currently with functional limitations due to the deficits listed below (see PT Problem List).   Noted slight dyspnea during walk on Room Air; O2 sats remained greater than or equal to 92% with walk on Room Air; HR noted 130 at highest, decr to 107 with seated rest   Pt will benefit from skilled PT to increase their independence and safety with mobility to allow discharge to the venue listed below.       Follow Up Recommendations SNF ( to maximize independence and safety with mobility prior to flying back to [place])    Equipment Recommendations  None recommended by PT    Recommendations for Other Services       Precautions / Restrictions Precautions Precautions: Fall Precaution Comments: incontinent      Mobility  Bed Mobility                  Transfers Overall transfer level: Needs assistance Equipment used: Rolling walker (2 wheeled) Transfers: Sit to/from Stand Sit to Stand: Min guard         General transfer comment: Cues for safety and hand placement  Ambulation/Gait Ambulation/Gait assistance: Min guard (without physical contact) Ambulation Distance (Feet): 140 Feet Assistive device: Rolling walker (2 wheeled) (pt's own youth-sized RW) Gait Pattern/deviations: Step-through pattern;Decreased stride length;Trunk flexed     General Gait Details: Cues for RW proximity and posture; Cues also to self-monitor for activity  tolerance quite tired at end of walk  Stairs            Wheelchair Mobility    Modified Rankin (Stroke Patients Only)       Balance Overall balance assessment: Needs assistance           Standing balance-Leahy Scale: Poor (approaching Fair)                               Pertinent Vitals/Pain Pain Assessment: No/denies pain    Home Living Family/patient expects to be discharged to:: Skilled nursing facility (then plan is back to [place] with relatives)                      Prior Function Level of Independence:  (Difficult to discern)         Comments: Uses RW full time     Hand Dominance        Extremity/Trunk Assessment   Upper Extremity Assessment: Generalized weakness           Lower Extremity Assessment: Generalized weakness         Communication   Communication:  ([place], bu tspeaks functional [place])  Cognition Arousal/Alertness: Awake/alert Behavior During Therapy: WFL for tasks assessed/performed Overall Cognitive Status: Within Functional Limits for tasks assessed                      General Comments General comments (skin integrity, edema, etc.):  Noted slight dyspnea during walk on Room Air; O2 sats remained greater than or equal to 92% with walk on Room Air; HR noted 130 at highest, decr to 107 with seated rest    Exercises        Assessment/Plan    PT Assessment Patient needs continued PT services  PT Diagnosis Difficulty walking;Generalized weakness   PT Problem List Decreased strength;Decreased activity tolerance;Decreased balance;Decreased mobility;Decreased knowledge of use of DME;Decreased knowledge of precautions;Cardiopulmonary status limiting activity  PT Treatment Interventions DME instruction;Gait training;Stair training;Functional mobility training;Therapeutic activities;Therapeutic exercise;Balance training;Patient/family education   PT Goals (Current goals can be found in the  Care Plan section) Acute Rehab PT Goals Patient Stated Goal: to get back to [facility] PT Goal Formulation: With patient Time For Goal Achievement: 04/05/16 Potential to Achieve Goals: Good    Frequency Min 3X/week   Barriers to discharge        Co-evaluation               End of Session Equipment Utilized During Treatment: Gait belt Activity Tolerance: Patient tolerated treatment well Patient left: in chair;with call bell/phone within reach Nurse Communication: Mobility status         Time: [id number] PT Time Calculation (min) (ACUTE ONLY): 22 min   Charges:   PT Evaluation $PT Eval Moderate Complexity: 1 Procedure     PT G CodesQuin Hoop 03/22/2016, 2:51 PM  Roney Marion, [place] Pager [phone] Office [phone]

## 2016-03-22 NOTE — NC FL2 (Signed)
[place] MEDICAID FL2 LEVEL OF CARE SCREENING TOOL     IDENTIFICATION  Patient Name: [name] Birthdate: Aug 04, 1921 Sex: female Admission Date (Current Location): 03/20/2016  [facility] and [place] Number:  Herbalist and Address:  [facility] [place], [place] [address], [place], [place] [address]      Provider Number: [id number]  Attending Physician Name and Address:  Aldine Contes, MD  Relative Name and Phone Number:       Current Level of Care: Hospital Recommended Level of Care: [place] Prior Approval Number:    Date Approved/Denied:   PASRR Number: [id number]  Discharge Plan: Home    Current Diagnoses: Patient Active Problem List   Diagnosis Date Noted  . Community acquired pneumonia 03/20/2016  . Acute respiratory failure with hypoxia ([place]) 03/20/2016  . Pressure ulcer 03/20/2016  . Seasonal allergies 03/13/2016  . Vitamin D insufficiency 06/09/2015  . Healthcare maintenance 02/11/2015  . Chronic anemia 02/07/2015  . Gastroesophageal reflux disease without esophagitis 07/04/2014  . Advance directive discussed with patient 03/26/2014  . Osteoarthritis 03/26/2014  . Abnormality of gait 05/19/2012  . CLL (chronic lymphocytic leukemia) ([place]) 04/04/2012  . CKD (chronic kidney disease) stage 3, GFR 30-59 ml/min 08/07/2008  . Type 2 diabetes mellitus with stage 3 chronic kidney disease ([place]) 09/28/2006  . Hyperlipidemia 09/28/2006  . Essential hypertension 09/28/2006  . Right-sided carotid artery obstruction 09/28/2006  . COLONIC POLYPS, HX OF 09/28/2006    Orientation RESPIRATION BLADDER Height & Weight     Self, Time, Situation  O2 (PRN) Incontinent Weight: 131 lb 9.8 oz (59.7 kg) Height:  4\' 7"  (139.7 cm)  BEHAVIORAL SYMPTOMS/MOOD NEUROLOGICAL BOWEL NUTRITION STATUS      Continent Diet (Carb Modified)  AMBULATORY STATUS COMMUNICATION OF NEEDS Skin   Limited Assist Verbally Other (Comment) (Stage 2 ulcer on  buttocks)                       Personal Care Assistance Level of Assistance  Bathing, Feeding, Dressing Bathing Assistance: Limited assistance Feeding assistance: Independent Dressing Assistance: Limited assistance     Functional Limitations Info  Sight, Hearing, Speech Sight Info: Adequate Hearing Info: Impaired Speech Info: Adequate    SPECIAL CARE FACTORS FREQUENCY  PT (By licensed PT), OT (By licensed OT)                    Contractures Contractures Info: Not present    Additional Factors Info  Code Status, Allergies Code Status Info: FULL Allergies Info: NKDA           Current Medications (03/22/2016):  This is the current hospital active medication list Current Facility-Administered Medications  Medication Dose Route Frequency Provider Last Rate Last Dose  . acetaminophen (TYLENOL) tablet 650 mg  650 mg Oral Q6H PRN Juluis Mire, MD       Or  . acetaminophen (TYLENOL) suppository 650 mg  650 mg Rectal Q6H PRN Marjan Rabbani, MD      . allopurinol (ZYLOPRIM) tablet 100 mg  100 mg Oral Daily Marjan Rabbani, MD   100 mg at 03/22/16 0839  . azithromycin (ZITHROMAX) tablet 250 mg  250 mg Oral Daily [name], MD   250 mg at 03/22/16 1309  . benzonatate (TESSALON) capsule 100 mg  100 mg Oral BID Juluis Mire, MD   100 mg at 03/22/16 0839  . cefpodoxime (VANTIN) tablet 200 mg  200 mg Oral Q24H [name], MD  200 mg at 03/22/16 1309  . dipyridamole-aspirin (AGGRENOX) 200-25 MG per 12 hr capsule 1 capsule  1 capsule Oral BID Juluis Mire, MD   1 capsule at 03/22/16 0839  . guaiFENesin (MUCINEX) 12 hr tablet 600 mg  600 mg Oral BID PRN Norval Gable, MD   600 mg at 03/22/16 0511  . heparin injection 5,000 Units  5,000 Units Subcutaneous Q8H Marjan Rabbani, MD   5,000 Units at 03/22/16 1310  . ipratropium-albuterol (DUONEB) 0.5-2.5 (3) MG/3ML nebulizer solution 3 mL  3 mL Nebulization TID Aldine Contes, MD   3 mL at 03/22/16 1348  . pantoprazole  (PROTONIX) EC tablet 40 mg  40 mg Oral Daily Marjan Rabbani, MD   40 mg at 03/22/16 0839  . [START ON 03/23/2016] predniSONE (DELTASONE) tablet 10 mg  10 mg Oral Q breakfast [name], MD      . simvastatin (ZOCOR) tablet 40 mg  40 mg Oral QHS Juluis Mire, MD   40 mg at 03/21/16 2109  . sodium chloride flush (NS) 0.9 % injection 3 mL  3 mL Intravenous Q12H Marjan Rabbani, MD   3 mL at 03/22/16 1005     Discharge Medications: Please see discharge summary for a list of discharge medications.  Relevant Imaging Results:  Relevant Lab Results:   Additional Information    Boone Master, LCSW

## 2016-03-22 NOTE — Clinical Social Work Note (Signed)
Clinical Social Work Assessment  Patient Details  Name: [name] MRN: [id number] Date of Birth: [birth year]  Date of referral:  03/22/16               Reason for consult:  Discharge Planning                Permission sought to share information with:  Family Supports Permission granted to share information::  Yes, Verbal Permission Granted  Name::     Technical brewer::     Relationship::  Therapist, art Information:     Housing/Transportation Living arrangements for the past 2 months:  Single Family Home Source of Information:  Patient Patient Interpreter Needed:  None Criminal Activity/Legal Involvement Pertinent to Current Situation/Hospitalization:  No - Comment as needed Significant Relationships:  Other Family Members Lives with:  Self Do you feel safe going back to the place where you live?  Yes Need for family participation in patient care:  Yes (Comment)  Care giving concerns:  PT recommends SNF   Social Worker assessment / plan:  CSW spoke with MD and bedside RN who reports pt is in the process of moving to [place] due to husband recently passing away. MD reports pt should be ready to DC tomorrow to SNF. CSW met with pt at bedside who is agreeable to SNF and prefers [facility]. CSW attempted to call nephew on international number but unable to be connected. CSW called Leroy Libman [facility] who confirmed that nephew is staying there but unable reach him at this time. CSW completed FL2 and faxed out. CSW will leave hand off for weekday CSW to attempt to contact family as well.  Employment status:  Retired Forensic scientist:  Medicare PT Recommendations:  [place] / Referral to community resources:  [place]  Patient/Family's Response to care:  Pt pleasant and cooperative. Pt thanked CSW for visit.  Patient/Family's Understanding of and Emotional Response to Diagnosis, Current Treatment, and Prognosis:  Pt reports she  hopes she can complete rehab and is happy to move back to [place].  Emotional Assessment Appearance:  Appears younger than stated age Attitude/Demeanor/Rapport:  Other (Pleasant) Affect (typically observed):  Appropriate Orientation:  Oriented to Self, Oriented to Place Alcohol / Substance use:  Not Applicable Psych involvement (Current and /or in the community):  No (Comment)  Discharge Needs  Concerns to be addressed:  No discharge needs identified Readmission within the last 30 days:  No Current discharge risk:  None Barriers to Discharge:  No Barriers Identified   Boone Master, [place] 03/22/2016, 3:58 PM Weekend Coverage

## 2016-03-23 DIAGNOSIS — C9110 Chronic lymphocytic leukemia of B-cell type not having achieved remission: Secondary | ICD-10-CM

## 2016-03-23 DIAGNOSIS — I10 Essential (primary) hypertension: Secondary | ICD-10-CM

## 2016-03-23 DIAGNOSIS — J189 Pneumonia, unspecified organism: Secondary | ICD-10-CM

## 2016-03-23 NOTE — Clinical Social Work Placement (Signed)
   CLINICAL SOCIAL WORK PLACEMENT  NOTE  Date:  03/23/2016  Patient Details  Name: [name] MRN: [id number] Date of Birth: 1921-07-05  Clinical Social Work is seeking post-discharge placement for this patient at the [place] level of care (*CSW will initial, date and re-position this form in  chart as items are completed):  Yes   Patient/family provided with [place] Work Department's list of facilities offering this level of care within the geographic area requested by the patient (or if unable, by the patient's family).  Yes   Patient/family informed of their freedom to choose among providers that offer the needed level of care, that participate in Medicare, Medicaid or managed care program needed by the patient, have an available bed and are willing to accept the patient.  Yes   Patient/family informed of [place]'s ownership interest in [facility] and [facility], as well as of the fact that they are under no obligation to receive care at these facilities.  PASRR submitted to EDS on 03/22/16     PASRR number received on 03/22/16     Existing PASRR number confirmed on       FL2 transmitted to all facilities in geographic area requested by pt/family on 03/22/16     FL2 transmitted to all facilities within larger geographic area on       Patient informed that his/her managed care company has contracts with or will negotiate with certain facilities, including the following:        Yes   Patient/family informed of bed offers received.  Patient chooses bed at [facility]     Physician recommends and patient chooses bed at      Patient to be transferred to [facility] on 03/23/16.  Patient to be transferred to facility by PTAR     Patient family notified on 03/23/16 of transfer.  Name of family member notified:  [place]     PHYSICIAN Please sign FL2     Additional Comment:     _______________________________________________ Cranford Mon, LCSW 03/23/2016, 2:59 PM

## 2016-03-23 NOTE — Progress Notes (Signed)
  Date: 03/23/2016  Patient name: [name]  Medical record number: [id number]  Date of birth: May 27, 1921   This patient has been seen and the plan of care was discussed with the house staff. Please see Dr. Shanna Cisco note for complete details. I concur with his findings.  Discharge to [facility] today.   Sid Falcon, MD 03/23/2016, 3:56 PM

## 2016-03-23 NOTE — Progress Notes (Addendum)
Patient ID: DHRUTHI FONDA, female   DOB: 06/17/1921, 82 y.o.   MRN: [id number]   Subjective: Ms. Mcmeen is feeling great this morning. No complaints this morning. Denies any shortness of breath or cough. Her nephew is requesting she go to [place] upon discharge.   Objective: Vital signs in last 24 hours: Filed Vitals:   03/22/16 2103 03/22/16 2242 03/23/16 0501 03/23/16 0958  BP:  160/63 137/44   Pulse: 66 77 56 75  Temp:  98.1 F (36.7 C) 97.4 F (36.3 C)   TempSrc:      Resp: 18 18 18 18   Height:      Weight:      SpO2: 96% 94% 95% 91%   General: resting in bed comfortably, appropriately conversational Cardiac: regular rate and rhythm, no rubs, murmurs or gallops Pulm: breathing well, minimal bibasilar crackles, no wheezing Abd: bowel sounds normal, soft, nondistended, non-tender Ext: warm and well perfused, without pedal edema  Medications: I have reviewed the patient's current medications. Scheduled Meds: . allopurinol  100 mg Oral Daily  . azithromycin  250 mg Oral Daily  . benzonatate  100 mg Oral BID  . cefpodoxime  200 mg Oral Q24H  . dipyridamole-aspirin  1 capsule Oral BID  . heparin  5,000 Units Subcutaneous Q8H  . ipratropium-albuterol  3 mL Nebulization TID  . pantoprazole  40 mg Oral Daily  . predniSONE  10 mg Oral Q breakfast  . simvastatin  40 mg Oral QHS  . sodium chloride flush  3 mL Intravenous Q12H   Continuous Infusions:  PRN Meds:.acetaminophen **OR** acetaminophen, guaiFENesin   Assessment/Plan:  Ms. Bring is a very friendly 82 year old [place] woman with CLL here with cough and hypoxia from community-acquired pneumonia.  Hypoxia from community-acquired pneumonia and COPD exacerbation: She has improved dramatically on IV antibiotics. Did not desat on ambulating yesterday morning or today. Transition her to oral antibiotics for a total of 7 day course given her elderly age. She's clear for discharge to SNF today. I've requested an  appointment in our clinic next week. -cefpoxoxime 200mg  twice daily, stop date 5/4 -azithromycin 250mg  daily, stop date 5/4 -prednisone 10mg  daily for CLL  Acute on chronic kidney disease stage 3: This has resolved with gentle rehydration; she's back to her baseline renal function. -Continue holding antihypertensives  Hypertension: Her pressures are fine in the 150s without any antihypertensives. Upon discharge we will likely stop her losartan and drop her furosemide to 20mg  daily. -Holding home losartan 100 mg daily -Will discharge on furosemide 20mg  daily  Dispo: Disposition is deferred at this time, awaiting improvement of current medical problems.  Anticipated discharge in approximately 0 day(s).   The patient does have a current PCP (Juluis Mire, MD) and does need an [facility] hospital follow-up appointment after discharge.  The patient does have transportation limitations that hinder transportation to clinic appointments.  .Services Needed at time of discharge: Y = Yes, Blank = No PT:   OT:   RN:   Equipment:   Other:     LOS: 3 days   Maryellen Pile, MD 03/23/2016, 11:45 AM

## 2016-03-23 NOTE — Care Management Important Message (Signed)
Important Message  Patient Details  Name: [name] MRN: [id number] Date of Birth: Dec 23, 1920   Medicare Important Message Given:  Yes    Nathen May 03/23/2016, 4:23 PM

## 2016-03-23 NOTE — Progress Notes (Signed)
Pt tolerated ambulating down hall. O2 sat decreased to 90% on room air, then increased and held between 93-95%.

## 2016-03-23 NOTE — Progress Notes (Signed)
OT Cancellation Note  Patient Details Name: [name] MRN: [id number] DOB: [birth year]   Cancelled Treatment:    Reason Eval/Treat Not Completed: Other (comment).Pt is Medicare and current D/C plan is SNF. No apparent immediate acute care OT needs, therefore will defer OT to SNF. If OT eval is needed please call Acute Rehab Dept. at [phone] or text page OT at [phone].    Almon Register [id number] 03/23/2016, 6:47 AM

## 2016-03-23 NOTE — Progress Notes (Signed)
Patient will discharge to [facility] Anticipated discharge date: 5/1 Family notified:pt nephew Transportation by [employer]- called at [place] signing off.  Domenica Reamer, [place] Social Worker [phone]

## 2016-03-25 LAB — CULTURE, BLOOD (ROUTINE X 2)
CULTURE: NO GROWTH
Culture: NO GROWTH

## 2016-04-01 ENCOUNTER — Telehealth: Payer: Self-pay | Admitting: Internal Medicine

## 2016-04-01 NOTE — Telephone Encounter (Signed)
APT. REMINDER CALL, NO ANSWER °

## 2016-04-02 ENCOUNTER — Ambulatory Visit: Payer: Medicare Other | Admitting: Internal Medicine

## 2016-04-03 ENCOUNTER — Encounter: Payer: Medicare Other | Admitting: Internal Medicine

## 2016-04-08 ENCOUNTER — Telehealth: Payer: Self-pay | Admitting: Oncology

## 2016-04-08 NOTE — Telephone Encounter (Signed)
Faxed pt office notes to [facility] [phone]

## 2016-04-30 ENCOUNTER — Ambulatory Visit: Payer: Medicare Other | Admitting: Oncology

## 2016-05-22 ENCOUNTER — Other Ambulatory Visit: Payer: Self-pay | Admitting: Nurse Practitioner

## 2016-05-28 ENCOUNTER — Ambulatory Visit: Payer: Medicare Other

## 2016-05-28 ENCOUNTER — Other Ambulatory Visit: Payer: Medicare Other

## 2016-06-03 IMAGING — CT CT CHEST W/O CM
2 of 5 series · 13 of 36 positions shown, 16 images · non-contrast
Comparison: Chest x-ray 03/20/2016

ADDENDUM:
Axial image 6 there is a intramuscular lipoma in left paraspinal
muscle upper chest wall posteriorly, medial to left scapula measures
at least 3.2 x 4.4 cm.
CLINICAL DATA: Cough, infiltrates on chest x-ray, hypoxia

EXAM:
CT CHEST WITHOUT CONTRAST
TECHNIQUE: Multidetector CT imaging of the chest was performed following the
standard protocol without IV contrast.

[Series 3: thorax 2.0 · axial · 0.59mm/px · z∈[+1621,+1837]mm · 10 of 132 slices shown, 13 images]
[im 12/132  mediastinal]
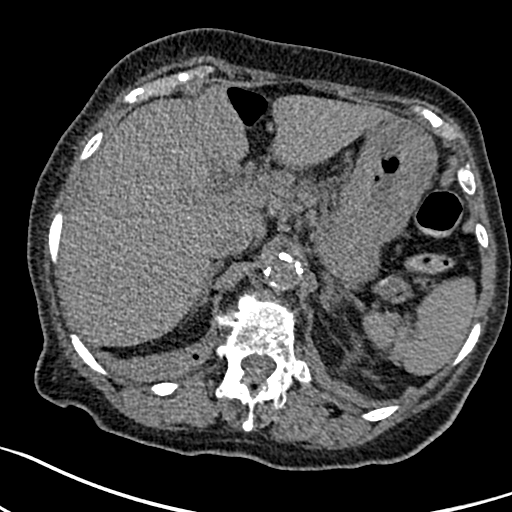
[im 12/132  lung]
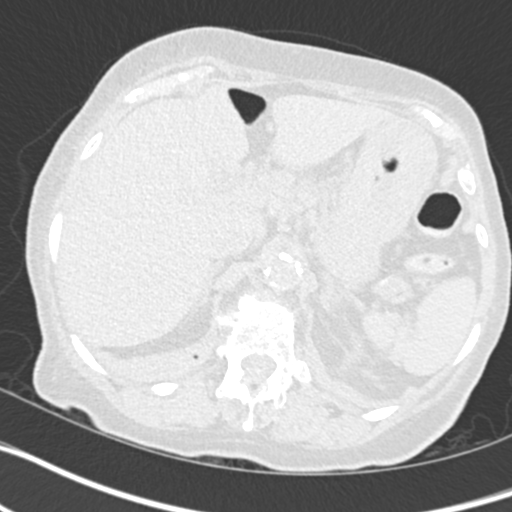
[im 24/132  lung]
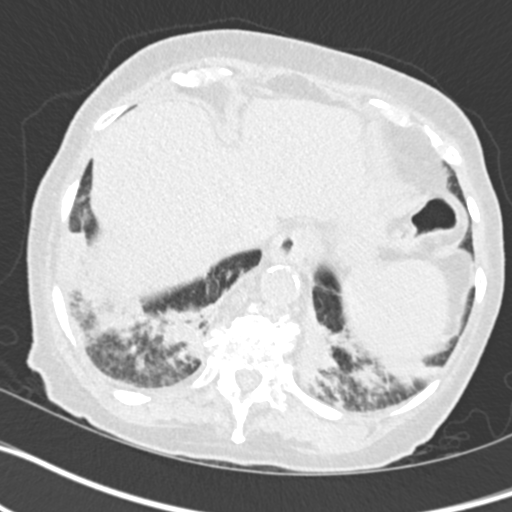
[im 36/132  lung]
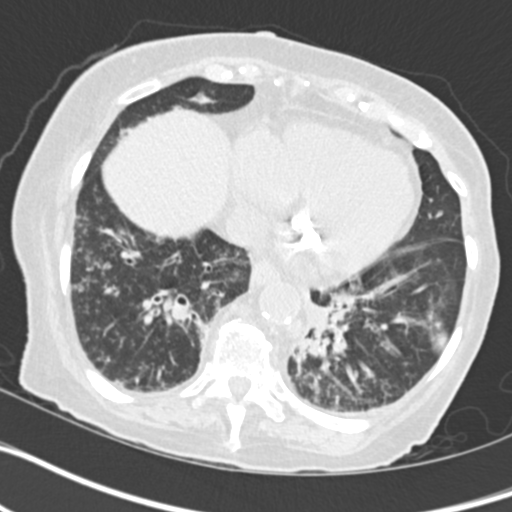
[im 48/132  lung]
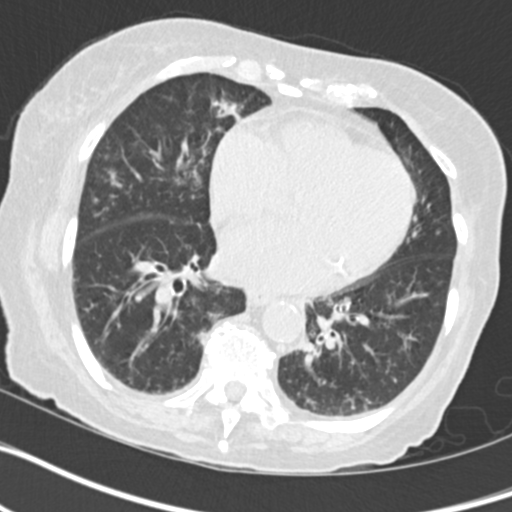
[im 60/132  mediastinal]
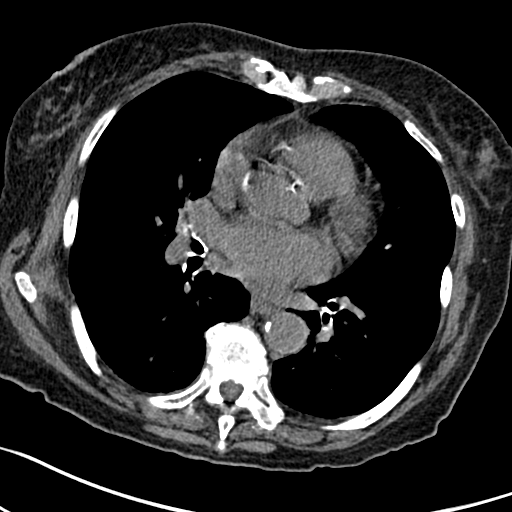
[im 60/132  lung]
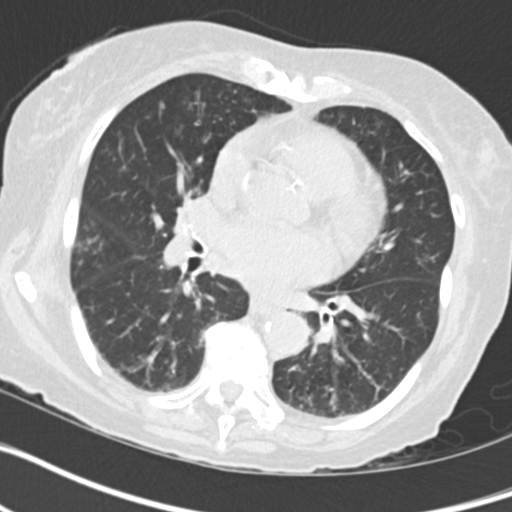
[im 72/132  lung]
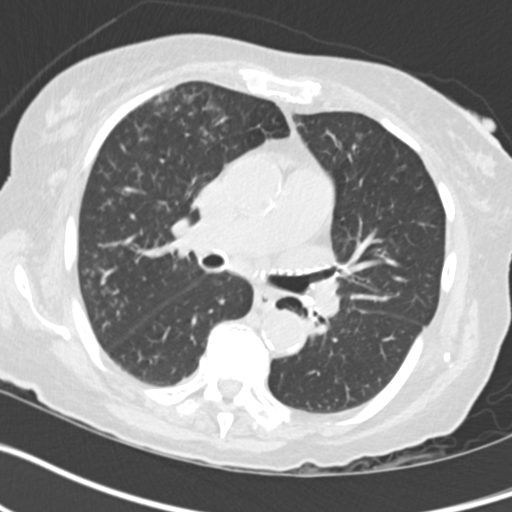
[im 84/132  lung]
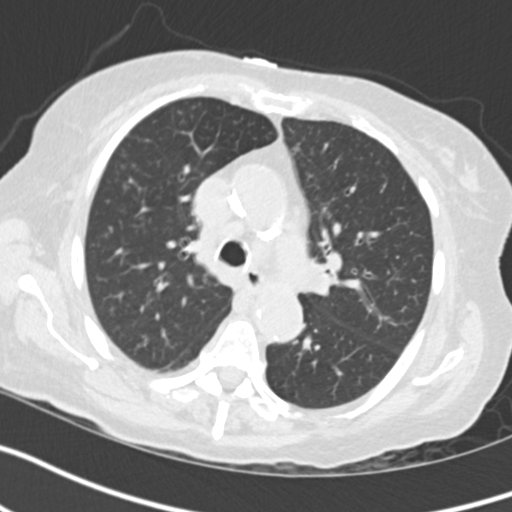
[im 96/132  lung]
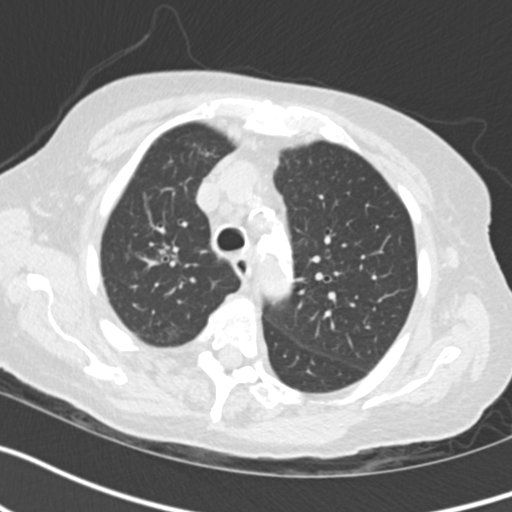
[im 108/132  mediastinal]
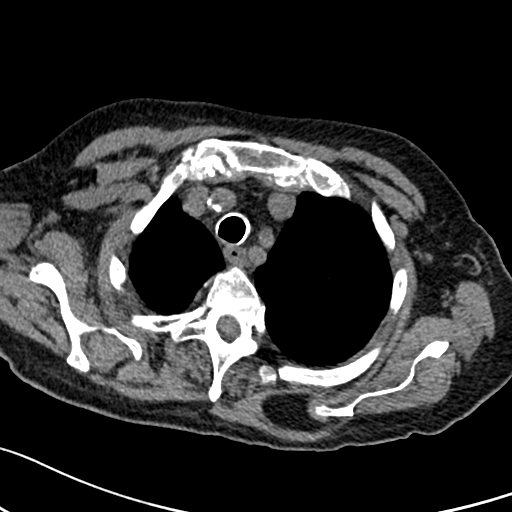
[im 108/132  lung]
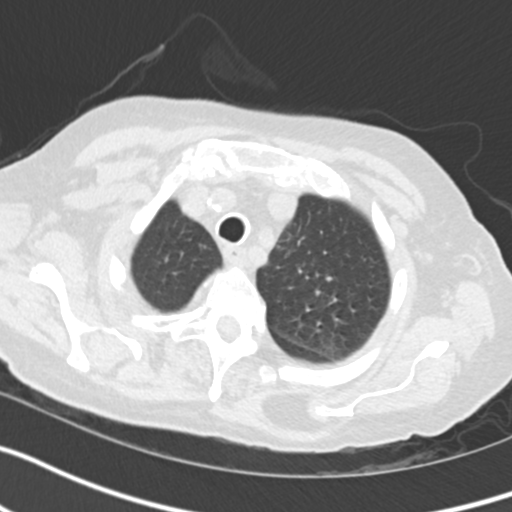
[im 120/132  lung]
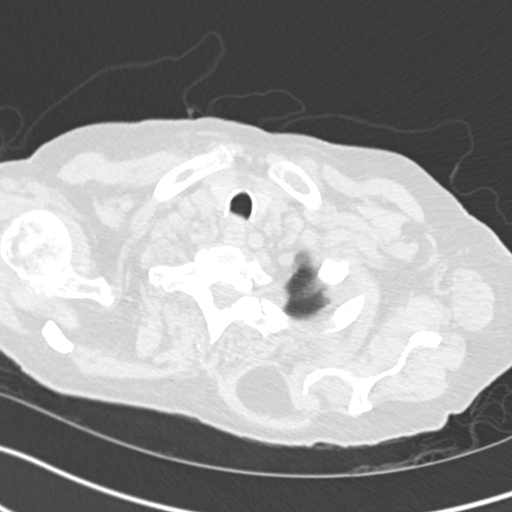

[Series 5: coronal · coronal · 0.52mm/px · 3 of 83 slices shown]
[im 17/83  lung]
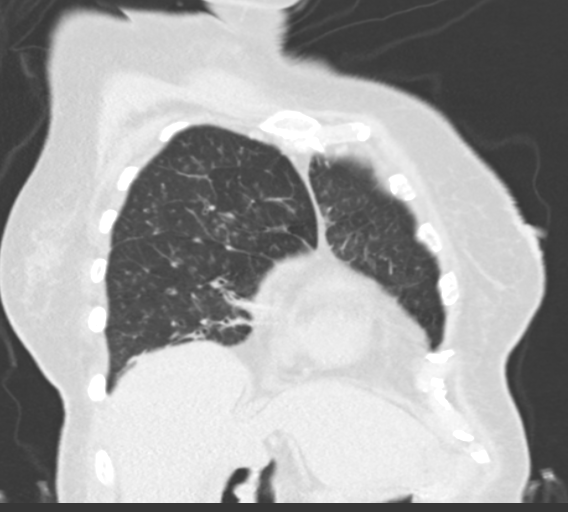
[im 33/83  lung]
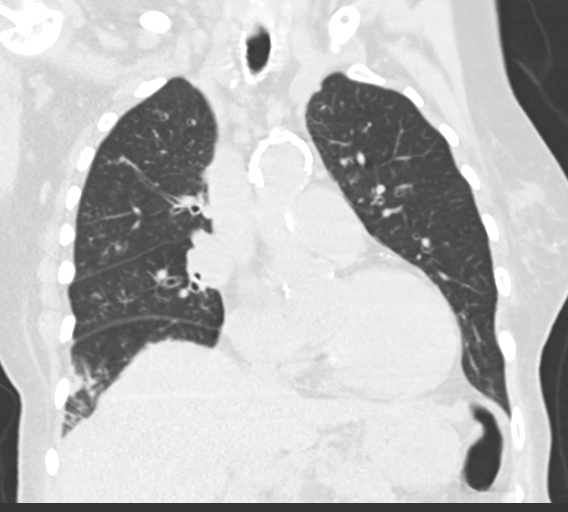
[im 50/83  lung]
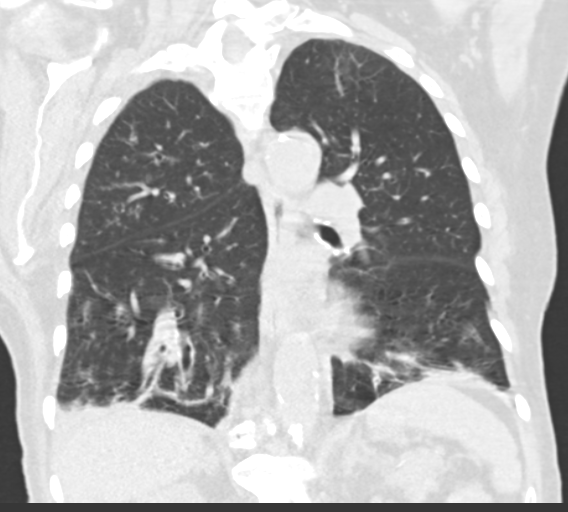

[13 of 36 positions shown; findings below may reference images not displayed]

FINDINGS: Mediastinum/Lymph Nodes: Images of the thoracic inlet are
unremarkable. Atherosclerotic calcifications of thoracic aorta and
coronary arteries. A precarinal lymph node measures 1 cm short-axis
borderline by size criteria. AP window lymph node measures 1 cm
short-axis borderline by size criteria. There is no hilar adenopathy
noted on this unenhanced scan. Heart size is within normal limits.
Small hiatal hernia. No pericardial effusion. Mitral valve
calcifications are noted.

Lungs/Pleura: As noted on chest x-ray and bilateral basilar
posterior infiltrates are noted highly suspicious for bilateral
pneumonia. Bronchitic changes are noted bilateral lower lobe. Patchy
tree-in-bud infiltrates are also noted in superior segment bilateral
lower lobe right upper lobe and right middle lobe. Superimposed
atypical infection cannot be excluded. Clinical correlation is
necessary.

Upper abdomen: Unenhanced liver shows no biliary ductal dilatation.
At least 2 calcified gallstones are noted within contracted
gallbladder the largest measures 1.3 cm. Atherosclerotic
calcifications of splenic artery and abdominal aorta. The visualized
unenhanced pancreas is unremarkable. No adrenal gland mass is noted
in visualized upper abdomen.

Musculoskeletal: Sagittal images of the spine shows degenerative
changes thoracic spine. Multilevel disc space flattening anterior
spurring and vacuum disc phenomenon. Sagittal view of the sternum is
unremarkable.
IMPRESSION: 1. As noted on chest x-ray and bilateral basilar posterior
infiltrates are noted highly suspicious for bilateral pneumonia.
Bronchitic changes are noted bilateral lower lobe. Patchy
tree-in-bud infiltrates are also noted in superior segment bilateral
lower lobe right upper lobe and right middle lobe. Superimposed
atypical infection cannot be excluded. Clinical correlation is
necessary.
2. Borderline mediastinal adenopathy is noted.  No hilar adenopathy.
3. Atherosclerotic calcifications of thoracic aorta and coronary
arteries.
4. Degenerative changes thoracic spine.
5. Cholelithiasis.

## 2016-07-23 ENCOUNTER — Ambulatory Visit: Payer: Medicare Other

## 2016-07-23 ENCOUNTER — Other Ambulatory Visit: Payer: Medicare Other

## 2017-06-23 DEATH — deceased
